# Patient Record
Sex: Female | Born: 1945
Health system: Southern US, Community
[De-identification: ages and names within clinical notes are randomized; demographics above are authoritative.]

## PROBLEM LIST (undated history)

## (undated) DIAGNOSIS — J45909 Unspecified asthma, uncomplicated: Secondary | ICD-10-CM

## (undated) DIAGNOSIS — T7840XA Allergy, unspecified, initial encounter: Secondary | ICD-10-CM

## (undated) DIAGNOSIS — E2839 Other primary ovarian failure: Secondary | ICD-10-CM

## (undated) DIAGNOSIS — M1990 Unspecified osteoarthritis, unspecified site: Secondary | ICD-10-CM

## (undated) DIAGNOSIS — K219 Gastro-esophageal reflux disease without esophagitis: Secondary | ICD-10-CM

## (undated) DIAGNOSIS — E669 Obesity, unspecified: Secondary | ICD-10-CM

## (undated) DIAGNOSIS — Z9889 Other specified postprocedural states: Secondary | ICD-10-CM

## (undated) DIAGNOSIS — H538 Other visual disturbances: Secondary | ICD-10-CM

## (undated) DIAGNOSIS — B370 Candidal stomatitis: Secondary | ICD-10-CM

## (undated) DIAGNOSIS — E8881 Metabolic syndrome: Secondary | ICD-10-CM

## (undated) DIAGNOSIS — R739 Hyperglycemia, unspecified: Secondary | ICD-10-CM

## (undated) DIAGNOSIS — M5020 Other cervical disc displacement, unspecified cervical region: Secondary | ICD-10-CM

## (undated) DIAGNOSIS — E559 Vitamin D deficiency, unspecified: Secondary | ICD-10-CM

## (undated) DIAGNOSIS — F329 Major depressive disorder, single episode, unspecified: Secondary | ICD-10-CM

## (undated) DIAGNOSIS — F32A Depression, unspecified: Secondary | ICD-10-CM

## (undated) HISTORY — DX: Unspecified osteoarthritis, unspecified site: M19.90

## (undated) HISTORY — DX: Depression, unspecified: F32.A

## (undated) HISTORY — DX: Other primary ovarian failure: E28.39

## (undated) HISTORY — DX: Other specified postprocedural states: Z98.890

## (undated) HISTORY — PX: CARPAL TUNNEL RELEASE: SHX101

## (undated) HISTORY — DX: Candidal stomatitis: B37.0

## (undated) HISTORY — DX: Metabolic syndrome: E88.810

## (undated) HISTORY — DX: Obesity, unspecified: E66.9

## (undated) HISTORY — DX: Major depressive disorder, single episode, unspecified: F32.9

## (undated) HISTORY — DX: Metabolic syndrome: E88.81

## (undated) HISTORY — PX: OTHER SURGICAL HISTORY: SHX169

## (undated) HISTORY — DX: Hyperglycemia, unspecified: R73.9

## (undated) HISTORY — DX: Other cervical disc displacement, unspecified cervical region: M50.20

## (undated) HISTORY — DX: Allergy, unspecified, initial encounter: T78.40XA

## (undated) HISTORY — DX: Vitamin D deficiency, unspecified: E55.9

## (undated) HISTORY — DX: Unspecified asthma, uncomplicated: J45.909

## (undated) HISTORY — DX: Other visual disturbances: H53.8

## (undated) HISTORY — PX: BILATERAL SALPINGOOPHORECTOMY: SHX1223

## (undated) HISTORY — DX: Gastro-esophageal reflux disease without esophagitis: K21.9

---

## 1988-11-16 HISTORY — PX: DILATION AND CURETTAGE OF UTERUS: SHX78

## 1989-11-16 HISTORY — PX: ABDOMINAL HYSTERECTOMY: SHX81

## 1990-11-16 HISTORY — PX: HERNIA REPAIR: SHX51

## 2005-04-20 ENCOUNTER — Ambulatory Visit: Payer: Self-pay | Admitting: Family Medicine

## 2006-09-03 ENCOUNTER — Ambulatory Visit: Payer: Self-pay | Admitting: Family Medicine

## 2006-11-05 ENCOUNTER — Ambulatory Visit: Payer: Self-pay | Admitting: Family Medicine

## 2008-02-08 ENCOUNTER — Ambulatory Visit: Payer: Self-pay | Admitting: Family Medicine

## 2009-11-13 ENCOUNTER — Ambulatory Visit: Payer: Self-pay | Admitting: Family Medicine

## 2010-06-16 LAB — HM DEXA SCAN: HM Dexa Scan: NORMAL

## 2010-06-20 LAB — HM PAP SMEAR: HM PAP: NORMAL

## 2010-11-14 ENCOUNTER — Ambulatory Visit: Payer: Self-pay | Admitting: Family Medicine

## 2012-02-24 ENCOUNTER — Ambulatory Visit: Payer: Self-pay | Admitting: Family Medicine

## 2012-02-24 LAB — HM MAMMOGRAPHY: HM MAMMO: NORMAL

## 2013-12-25 LAB — LIPID PANEL
Cholesterol: 191 mg/dL (ref 0–200)
HDL: 49 mg/dL (ref 35–70)
LDL Cholesterol: 122 mg/dL
Triglycerides: 100 mg/dL (ref 40–160)

## 2014-11-19 DIAGNOSIS — H269 Unspecified cataract: Secondary | ICD-10-CM | POA: Diagnosis not present

## 2015-01-24 LAB — HEMOGLOBIN A1C: HEMOGLOBIN A1C: 5.4 % (ref 4.0–6.0)

## 2015-07-27 ENCOUNTER — Encounter: Payer: Self-pay | Admitting: Family Medicine

## 2015-07-27 DIAGNOSIS — M1990 Unspecified osteoarthritis, unspecified site: Secondary | ICD-10-CM | POA: Insufficient documentation

## 2015-07-27 DIAGNOSIS — G5600 Carpal tunnel syndrome, unspecified upper limb: Secondary | ICD-10-CM | POA: Insufficient documentation

## 2015-07-27 DIAGNOSIS — E8881 Metabolic syndrome: Secondary | ICD-10-CM | POA: Insufficient documentation

## 2015-07-27 DIAGNOSIS — F330 Major depressive disorder, recurrent, mild: Secondary | ICD-10-CM | POA: Insufficient documentation

## 2015-07-27 DIAGNOSIS — M5020 Other cervical disc displacement, unspecified cervical region: Secondary | ICD-10-CM | POA: Insufficient documentation

## 2015-07-27 DIAGNOSIS — J4540 Moderate persistent asthma, uncomplicated: Secondary | ICD-10-CM | POA: Insufficient documentation

## 2015-07-27 DIAGNOSIS — S43429A Sprain of unspecified rotator cuff capsule, initial encounter: Secondary | ICD-10-CM | POA: Insufficient documentation

## 2015-07-27 DIAGNOSIS — R3915 Urgency of urination: Secondary | ICD-10-CM | POA: Insufficient documentation

## 2015-07-27 DIAGNOSIS — E559 Vitamin D deficiency, unspecified: Secondary | ICD-10-CM | POA: Insufficient documentation

## 2015-07-27 DIAGNOSIS — J309 Allergic rhinitis, unspecified: Secondary | ICD-10-CM | POA: Insufficient documentation

## 2015-07-27 DIAGNOSIS — K219 Gastro-esophageal reflux disease without esophagitis: Secondary | ICD-10-CM | POA: Insufficient documentation

## 2015-07-27 DIAGNOSIS — R739 Hyperglycemia, unspecified: Secondary | ICD-10-CM | POA: Insufficient documentation

## 2015-07-29 ENCOUNTER — Ambulatory Visit (INDEPENDENT_AMBULATORY_CARE_PROVIDER_SITE_OTHER): Payer: Medicare PPO | Admitting: Family Medicine

## 2015-07-29 ENCOUNTER — Encounter: Payer: Self-pay | Admitting: Family Medicine

## 2015-07-29 VITALS — BP 120/86 | HR 100 | Temp 98.6°F | Resp 16 | Ht 62.0 in | Wt 282.4 lb

## 2015-07-29 DIAGNOSIS — E538 Deficiency of other specified B group vitamins: Secondary | ICD-10-CM

## 2015-07-29 DIAGNOSIS — Z1239 Encounter for other screening for malignant neoplasm of breast: Secondary | ICD-10-CM

## 2015-07-29 DIAGNOSIS — K219 Gastro-esophageal reflux disease without esophagitis: Secondary | ICD-10-CM

## 2015-07-29 DIAGNOSIS — J4540 Moderate persistent asthma, uncomplicated: Secondary | ICD-10-CM

## 2015-07-29 DIAGNOSIS — F32A Depression, unspecified: Secondary | ICD-10-CM

## 2015-07-29 DIAGNOSIS — E8881 Metabolic syndrome: Secondary | ICD-10-CM

## 2015-07-29 DIAGNOSIS — R739 Hyperglycemia, unspecified: Secondary | ICD-10-CM | POA: Diagnosis not present

## 2015-07-29 DIAGNOSIS — Z23 Encounter for immunization: Secondary | ICD-10-CM

## 2015-07-29 DIAGNOSIS — Z1211 Encounter for screening for malignant neoplasm of colon: Secondary | ICD-10-CM

## 2015-07-29 DIAGNOSIS — F329 Major depressive disorder, single episode, unspecified: Secondary | ICD-10-CM

## 2015-07-29 DIAGNOSIS — D692 Other nonthrombocytopenic purpura: Secondary | ICD-10-CM | POA: Diagnosis not present

## 2015-07-29 DIAGNOSIS — H8112 Benign paroxysmal vertigo, left ear: Secondary | ICD-10-CM | POA: Diagnosis not present

## 2015-07-29 MED ORDER — BREO ELLIPTA 100-25 MCG/INH IN AEPB: 1.0000 | INHALATION_SPRAY | Freq: Every day | RESPIRATORY_TRACT | Status: DC

## 2015-07-29 MED ORDER — ASPIRIN EC 81 MG PO TBEC: 81.0000 mg | DELAYED_RELEASE_TABLET | Freq: Every day | ORAL | Status: DC

## 2015-07-29 MED ORDER — OMEPRAZOLE 40 MG PO CPDR: 40.0000 mg | DELAYED_RELEASE_CAPSULE | Freq: Every day | ORAL | Status: DC

## 2015-07-29 NOTE — Progress Notes (Signed)
Name: Christie Hanson   MRN: [id number]    DOB: 11/01/1946   Date:07/29/2015       Progress Note  Subjective  Chief Complaint  Chief Complaint  Patient presents with  . Medication Refill    6 month follow-up  . Gastrophageal Reflux  . Asthma  . Hypoglycemia    pt states been checking at home due to blurred vision and numbness. pt states stays aroung 115 fasting    HPI  Hypoglycemia/Metabolic Syndrome: glucose at home has been in great control average 115, lowest was 102 highest of 122. She continues to have blurred vision but also has mild cataracts and needs to follow up with Ophtalmologist  Asthma Moderate: she states occasionally wakes up night with some wheezing , and has a cough and symptoms goes away. About twice monthly. Denies SOB with activity, using Breo most days, and no need to use rescue inhaler in the past 6 months, last flare was about 7 months ago.   GERD: taking Omeprazole daily, no heartburn, occasionally has regurgitation. Avoids eating before going to bed  Depression: she stopped taking all medication, doing well, sewing more , likes having longer hours in a day. Occasionally has crying spells but doing well, and does not want to go back on medication   Vertigo: she has noticed that she gest dizzy when she has head movement to the left side. She has a history of BPPV but this episode has been going on for the past 6 weeks.    Patient Active Problem List   Diagnosis Date Noted  . Vitamin B12 deficiency 07/29/2015  . Carpal tunnel syndrome 07/27/2015  . Osteoarthritis 07/27/2015  . Clinical depression 07/27/2015  . Gastro-esophageal reflux disease without esophagitis 07/27/2015  . Displacement of cervical intervertebral disc without myelopathy 07/27/2015  . Blood glucose elevated 07/27/2015  . Dysmetabolic syndrome 07/27/2015  . Vitamin D deficiency 07/27/2015  . Sprain of rotator cuff capsule 07/27/2015  . Urgency of urination 07/27/2015  . Allergic  rhinitis 07/27/2015  . Asthma, moderate persistent 07/27/2015    Past Surgical History  Procedure Laterality Date  . Carpal tunnel release Bilateral [id number]    [facility]  . Abdominal hysterectomy    . Bilateral salpingoophorectomy    . Dandc    . Hernia repair      Family History  Problem Relation Age of Onset  . Hypertension Mother   . Cancer Father     prostate  . CVA Father   . Depression Father   . Depression Brother   . Cancer Brother     prostate  . Depression Brother     Social History   Social History  . Marital Status: Married    Spouse Name: N/A  . Number of Children: N/A  . Years of Education: N/A   Occupational History  . Not on file.   Social History Main Topics  . Smoking status: Never Smoker   . Smokeless tobacco: Never Used  . Alcohol Use: No  . Drug Use: No  . Sexual Activity: Yes   Other Topics Concern  . Not on file   Social History Narrative     Current outpatient prescriptions:  .  acetaminophen (TYLENOL) 500 MG tablet, Take 1 tablet by mouth as needed., Disp: , Rfl:  .  BREO ELLIPTA 100-25 MCG/INH AEPB, Inhale 1 puff into the lungs daily., Disp: 1 each, Rfl: 5 .  Cholecalciferol (VITAMIN D) 2000 UNITS tablet, Take 1 tablet by mouth daily., Disp: ,  Rfl:  .  cyanocobalamin 100 MCG tablet, Take 1 tablet by mouth daily., Disp: , Rfl:  .  omeprazole (PRILOSEC) 40 MG capsule, Take 1 capsule (40 mg total) by mouth daily., Disp: 30 capsule, Rfl: 5 .  aspirin EC 81 MG tablet, Take 1 tablet (81 mg total) by mouth daily., Disp: 30 tablet, Rfl: 0  Allergies  Allergen Reactions  . Morphine   . Penicillins   . Sulfa Antibiotics      ROS  Constitutional: Negative for fever or weight change.  Respiratory: Negative for cough and shortness of breath.   Cardiovascular: Negative for chest pain or palpitations.  Gastrointestinal: Negative for abdominal pain, no bowel changes.  Musculoskeletal: Negative for gait problem or joint swelling.  Skin:  Negative for rash.  Neurological: Positive  for dizziness , no  headache.  No other specific complaints in a complete review of systems (except as listed in HPI above).  Objective  Filed Vitals:   07/29/15 1335  BP: 120/86  Pulse: 100  Temp: 98.6 F (37 C)  TempSrc: Oral  Resp: 16  Height:  (1.575 m)  Weight: 282 lb 6.4 oz (128.096 kg)  SpO2: 96%    Body mass index is 51.64 kg/(m^2).  Physical Exam  Constitutional: Patient appears well-developed and well-nourished. Obese  No distress.  HEENT: head atraumatic, normocephalic, pupils equal and reactive to light, neck supple, throat within normal limits Cardiovascular: Normal rate, regular rhythm and normal heart sounds.  No murmur heard. No BLE edema. Pulmonary/Chest: Effort normal and breath sounds normal. No respiratory distress. Abdominal: Soft.  There is no tenderness. Psychiatric: Patient has a normal mood and affect. behavior is normal. Judgment and thought content normal.  PHQ2/9: Depression screen PHQ 2/9 07/29/2015  Decreased Interest 0  Down, Depressed, Hopeless 1  PHQ - 2 Score 1    Fall Risk: Fall Risk  07/29/2015  Falls in the past year? No     Functional Status Survey: Is the patient deaf or have difficulty hearing?: No Does the patient have difficulty seeing, even when wearing glasses/contacts?: Yes (glasses) Does the patient have difficulty concentrating, remembering, or making decisions?: No Does the patient have difficulty walking or climbing stairs?: No Does the patient have difficulty dressing or bathing?: No Does the patient have difficulty doing errands alone such as visiting a doctor's office or shopping?: No    Assessment & Plan  1. BPPV (benign paroxysmal positional vertigo), left -ENT  2. Needs flu shot  - Flu vaccine HIGH DOSE PF (Fluzone High dose)  3. Breast cancer screening  - MM Digital Screening; Future  4. Asthma, moderate persistent, uncomplicated  - BREO ELLIPTA  100-25 MCG/INH AEPB; Inhale 1 puff into the lungs daily.  Dispense: 1 each; Refill: 5  5. Dysmetabolic syndrome Doing well, glucose at goal, go to eye doctor to evaluate blurred vision  6. Blood glucose elevated Recheck before next visit  7. Clinical depression Weaned self off medication and is stable  8. Gastro-esophageal reflux disease without esophagitis  - omeprazole (PRILOSEC) 40 MG capsule; Take 1 capsule (40 mg total) by mouth daily.  Dispense: 30 capsule; Refill: 5  9. Colon cancer screening Refused to have Cologuard or colonoscopy   10. Vitamin B12 deficiency Recheck labs before next visit   11. Senile purpura

## 2015-08-29 DIAGNOSIS — H353131 Nonexudative age-related macular degeneration, bilateral, early dry stage: Secondary | ICD-10-CM | POA: Diagnosis not present

## 2015-08-29 DIAGNOSIS — H2513 Age-related nuclear cataract, bilateral: Secondary | ICD-10-CM | POA: Diagnosis not present

## 2015-09-03 DIAGNOSIS — H2513 Age-related nuclear cataract, bilateral: Secondary | ICD-10-CM | POA: Diagnosis not present

## 2015-09-09 ENCOUNTER — Encounter: Payer: Self-pay | Admitting: Family Medicine

## 2015-09-11 ENCOUNTER — Ambulatory Visit: Admit: 2015-09-11 | Payer: Self-pay | Admitting: Ophthalmology

## 2015-09-11 SURGERY — PHACOEMULSIFICATION, CATARACT, WITH IOL INSERTION
Anesthesia: Topical | Laterality: Left

## 2015-09-23 ENCOUNTER — Ambulatory Visit: Payer: Medicare PPO | Admitting: Anesthesiology

## 2015-09-23 ENCOUNTER — Encounter
Admission: RE | Disposition: A | Discharge status: Home / Self Care | Payer: Self-pay | Source: Ambulatory Visit | Attending: Ophthalmology

## 2015-09-23 ENCOUNTER — Encounter: Payer: Self-pay | Admitting: *Deleted

## 2015-09-23 ENCOUNTER — Ambulatory Visit
Admission: RE | Admit: 2015-09-23 | Discharge: 2015-09-23 | Disposition: A | Discharge status: Home / Self Care | Payer: Medicare PPO | Source: Ambulatory Visit | Attending: Ophthalmology | Admitting: Ophthalmology

## 2015-09-23 DIAGNOSIS — Z880 Allergy status to penicillin: Secondary | ICD-10-CM | POA: Diagnosis not present

## 2015-09-23 DIAGNOSIS — G709 Myoneural disorder, unspecified: Secondary | ICD-10-CM | POA: Diagnosis not present

## 2015-09-23 DIAGNOSIS — Z6841 Body Mass Index (BMI) 40.0 and over, adult: Secondary | ICD-10-CM | POA: Insufficient documentation

## 2015-09-23 DIAGNOSIS — F329 Major depressive disorder, single episode, unspecified: Secondary | ICD-10-CM | POA: Diagnosis not present

## 2015-09-23 DIAGNOSIS — Z885 Allergy status to narcotic agent status: Secondary | ICD-10-CM | POA: Diagnosis not present

## 2015-09-23 DIAGNOSIS — J45909 Unspecified asthma, uncomplicated: Secondary | ICD-10-CM | POA: Diagnosis not present

## 2015-09-23 DIAGNOSIS — M1990 Unspecified osteoarthritis, unspecified site: Secondary | ICD-10-CM | POA: Insufficient documentation

## 2015-09-23 DIAGNOSIS — K219 Gastro-esophageal reflux disease without esophagitis: Secondary | ICD-10-CM | POA: Insufficient documentation

## 2015-09-23 DIAGNOSIS — H2512 Age-related nuclear cataract, left eye: Secondary | ICD-10-CM | POA: Diagnosis not present

## 2015-09-23 DIAGNOSIS — H2513 Age-related nuclear cataract, bilateral: Secondary | ICD-10-CM | POA: Diagnosis not present

## 2015-09-23 HISTORY — PX: CATARACT EXTRACTION W/PHACO: SHX586

## 2015-09-23 SURGERY — PHACOEMULSIFICATION, CATARACT, WITH IOL INSERTION
Anesthesia: Monitor Anesthesia Care | Laterality: Left | Wound class: Clean

## 2015-09-23 MED ORDER — CEFUROXIME OPHTHALMIC INJECTION 1 MG/0.1 ML
INJECTION | OPHTHALMIC | Status: DC | PRN
  Administered 2015-09-23: 0.1 mL via INTRACAMERAL

## 2015-09-23 MED ORDER — HYALURONIDASE HUMAN 150 UNIT/ML IJ SOLN
INTRAMUSCULAR | Status: AC
  Filled 2015-09-23: qty 1

## 2015-09-23 MED ORDER — MOXIFLOXACIN HCL 0.5 % OP SOLN: 1.0000 [drp] | Freq: Once | OPHTHALMIC | Status: DC

## 2015-09-23 MED ORDER — ARMC OPHTHALMIC DILATING GEL
1.0000 | OPHTHALMIC | Status: DC | PRN
Start: 2015-09-23 — End: 2015-09-23
  Administered 2015-09-23: 1 via OPHTHALMIC

## 2015-09-23 MED ORDER — MIDAZOLAM HCL 2 MG/2ML IJ SOLN
INTRAMUSCULAR | Status: DC | PRN
  Administered 2015-09-23: 1 mg via INTRAVENOUS

## 2015-09-23 MED ORDER — ARMC OPHTHALMIC DILATING GEL
OPHTHALMIC | Status: AC
  Filled 2015-09-23: qty 0.25

## 2015-09-23 MED ORDER — LIDOCAINE HCL (PF) 4 % IJ SOLN
INTRAMUSCULAR | Status: AC
  Filled 2015-09-23: qty 5

## 2015-09-23 MED ORDER — POVIDONE-IODINE 5 % OP SOLN
OPHTHALMIC | Status: AC
  Filled 2015-09-23: qty 30

## 2015-09-23 MED ORDER — CARBACHOL 0.01 % IO SOLN
INTRAOCULAR | Status: DC | PRN
  Administered 2015-09-23: 0.5 mL via INTRAOCULAR

## 2015-09-23 MED ORDER — MOXIFLOXACIN HCL 0.5 % OP SOLN
OPHTHALMIC | Status: AC
  Filled 2015-09-23: qty 3

## 2015-09-23 MED ORDER — NEOMYCIN-POLYMYXIN-DEXAMETH 3.5-10000-0.1 OP OINT
TOPICAL_OINTMENT | OPHTHALMIC | Status: AC
  Filled 2015-09-23: qty 3.5

## 2015-09-23 MED ORDER — NEOMYCIN-POLYMYXIN-DEXAMETH 0.1 % OP OINT
TOPICAL_OINTMENT | OPHTHALMIC | Status: DC | PRN
  Administered 2015-09-23: 1 via OPHTHALMIC

## 2015-09-23 MED ORDER — EPINEPHRINE HCL 1 MG/ML IJ SOLN
INTRAMUSCULAR | Status: AC
  Filled 2015-09-23: qty 1

## 2015-09-23 MED ORDER — NA HYALUR & NA CHOND-NA HYALUR 0.4-0.35 ML IO KIT
PACK | INTRAOCULAR | Status: DC | PRN
  Administered 2015-09-23: 1 mL via INTRAOCULAR

## 2015-09-23 MED ORDER — SODIUM CHLORIDE 0.9 % IV SOLN
INTRAVENOUS | Status: DC
Start: 2015-09-23 — End: 2015-09-23
  Administered 2015-09-23: 06:00:00 via INTRAVENOUS

## 2015-09-23 MED ORDER — POVIDONE-IODINE 5 % OP SOLN
1.0000 "application " | OPHTHALMIC | Status: AC | PRN
  Administered 2015-09-23: 1 via OPHTHALMIC

## 2015-09-23 MED ORDER — TETRACAINE HCL 0.5 % OP SOLN
1.0000 [drp] | OPHTHALMIC | Status: AC | PRN
  Administered 2015-09-23: 1 [drp] via OPHTHALMIC

## 2015-09-23 MED ORDER — EPINEPHRINE HCL 1 MG/ML IJ SOLN
INTRAOCULAR | Status: DC | PRN
  Administered 2015-09-23: 1 mL via OPHTHALMIC

## 2015-09-23 MED ORDER — FENTANYL CITRATE (PF) 100 MCG/2ML IJ SOLN
INTRAMUSCULAR | Status: DC | PRN
  Administered 2015-09-23: 50 ug via INTRAVENOUS

## 2015-09-23 MED ORDER — NA HYALUR & NA CHOND-NA HYALUR 0.55-0.5 ML IO KIT
PACK | INTRAOCULAR | Status: AC
  Filled 2015-09-23: qty 1.05

## 2015-09-23 MED ORDER — BUPIVACAINE HCL (PF) 0.75 % IJ SOLN
INTRAMUSCULAR | Status: AC
  Filled 2015-09-23: qty 10

## 2015-09-23 MED ORDER — TETRACAINE HCL 0.5 % OP SOLN
OPHTHALMIC | Status: AC
  Filled 2015-09-23: qty 2

## 2015-09-23 MED ORDER — CEFUROXIME OPHTHALMIC INJECTION 1 MG/0.1 ML
INJECTION | OPHTHALMIC | Status: AC
  Filled 2015-09-23: qty 0.1

## 2015-09-23 SURGICAL SUPPLY — 22 items
CANNULA ANT/CHMB 27GA (MISCELLANEOUS) ×3 IMPLANT
CUP MEDICINE 2OZ PLAST GRAD ST (MISCELLANEOUS) ×3 IMPLANT
GLOVE BIO SURGEON STRL SZ8 (GLOVE) ×3 IMPLANT
GLOVE BIOGEL M 6.5 STRL (GLOVE) ×3 IMPLANT
GLOVE SURG LX 7.5 STRW (GLOVE) ×2
GLOVE SURG LX STRL 7.5 STRW (GLOVE) ×1 IMPLANT
GOWN STRL REUS W/ TWL LRG LVL3 (GOWN DISPOSABLE) ×2 IMPLANT
GOWN STRL REUS W/TWL LRG LVL3 (GOWN DISPOSABLE) ×4
LENS IOL TECNIS 15.0 (Intraocular Lens) ×3 IMPLANT
LENS IOL TECNIS MONO 1P 15.0 (Intraocular Lens) ×1 IMPLANT
PACK CATARACT (MISCELLANEOUS) ×3 IMPLANT
PACK CATARACT BRASINGTON LX (MISCELLANEOUS) ×3 IMPLANT
PACK EYE AFTER SURG (MISCELLANEOUS) ×3 IMPLANT
SOL BSS BAG (MISCELLANEOUS) ×3
SOL PREP PVP 2OZ (MISCELLANEOUS) ×3
SOLUTION BSS BAG (MISCELLANEOUS) ×1 IMPLANT
SOLUTION PREP PVP 2OZ (MISCELLANEOUS) ×1 IMPLANT
SYR 3ML LL SCALE MARK (SYRINGE) ×3 IMPLANT
SYR 5ML LL (SYRINGE) ×3 IMPLANT
SYR TB 1ML 27GX1/2 LL (SYRINGE) ×3 IMPLANT
WATER STERILE IRR 1000ML POUR (IV SOLUTION) ×3 IMPLANT
WIPE NON LINTING 3.25X3.25 (MISCELLANEOUS) ×3 IMPLANT

## 2015-09-23 NOTE — Op Note (Signed)
OPERATIVE NOTE  Christie HerbKatherine J Hanson [id number] 09/23/2015   PREOPERATIVE DIAGNOSIS:  Nuclear sclerotic cataract left eye. H25.12   POSTOPERATIVE DIAGNOSIS:    Nuclear sclerotic cataract left eye.     PROCEDURE:  Phacoemusification with posterior chamber intraocular lens placement of the left eye   LENS:   Implant Name Type Inv. Item Serial No. Manufacturer Lot No. LRB No. Used  LENS IMPL INTRAOC ZCB00 15.0 - [id number] Intraocular Lens LENS IMPL INTRAOC ZCB00 15.0 [id number]   Left 1        ULTRASOUND TIME: 12 % of 0 minutes, 57 seconds.  CDE 7.0   SURGEON:  Deirdre Evenerhadwick R. Nahomi Hegner, MD   ANESTHESIA:  Topical with tetracaine drops and 2% Xylocaine jelly.   COMPLICATIONS:  None.   DESCRIPTION OF PROCEDURE:  The patient was identified in the holding room and transported to the operating room and placed in the supine position under the operating microscope.  The left eye was identified as the operative eye and it was prepped and draped in the usual sterile ophthalmic fashion.   A 1 millimeter clear-corneal paracentesis was made at the 1:30 position.  The anterior chamber was filled with Viscoat viscoelastic.  A 2.4 millimeter keratome was used to make a near-clear corneal incision at the 10:30 position.  .  A curvilinear capsulorrhexis was made with a cystotome and capsulorrhexis forceps.  Balanced salt solution was used to hydrodissect and hydrodelineate the nucleus.   Phacoemulsification was then used in stop and chop fashion to remove the lens nucleus and epinucleus.  The remaining cortex was then removed using the irrigation and aspiration handpiece. Provisc was then placed into the capsular bag to distend it for lens placement.  A lens was then injected into the capsular bag.  The remaining viscoelastic was aspirated.   Wounds were hydrated with balanced salt solution.  The anterior chamber was inflated to a physiologic pressure with balanced salt solution. Cefuroxime 0.1 ml of a  10mg /ml solution was injected into the anterior chamber for a dose of 1 mg of intracameral antibiotic at the completion of the case.  Miostat was placed into the anterior chamber to constrict the pupil.  No wound leaks were noted.  Topical Vigamox drops and Maxitrol ointment were applied to the eye.  The patient was taken to the recovery room in stable condition without complications of anesthesia or surgery  Heber Hoog 09/23/2015, 7:58 AM

## 2015-09-23 NOTE — Anesthesia Procedure Notes (Signed)
Date/Time: 09/23/2015 7:30 AM Performed by: Henrietta HooverPOPE, Annalina Needles Pre-anesthesia Checklist: Patient identified, Emergency Drugs available, Suction available, Patient being monitored and Timeout performed Oxygen Delivery Method: Nasal cannula Preoxygenation: Pre-oxygenation with 100% oxygen Intubation Type: IV induction

## 2015-09-23 NOTE — Discharge Instructions (Signed)
AMBULATORY SURGERY  DISCHARGE INSTRUCTIONS   1) The drugs that you were given will stay in your system until tomorrow so for the next 24 hours you should not:  A) Drive an automobile B) Make any legal decisions C) Drink any alcoholic beverage   2) You may resume regular meals tomorrow.  Today it is better to start with liquids and gradually work up to solid foods.  You may eat anything you prefer, but it is better to start with liquids, then soup and crackers, and gradually work up to solid foods.   3) Please notify your doctor immediately if you have any unusual bleeding, trouble breathing, redness and pain at the surgery site, drainage, fever, or pain not relieved by medication.    4) Additional Instructions:   Eye Surgery Discharge Instructions  Expect mild scratchy sensation or mild soreness. DO NOT RUB YOUR EYE!  The day of surgery:  Minimal physical activity, but bed rest is not required  No reading, computer work, or close hand work  No bending, lifting, or straining.  May watch TV  For 24 hours:  No driving, legal decisions, or alcoholic beverages  Safety precautions  Eat anything you prefer: It is better to start with liquids, then soup then solid foods.  _____ Eye patch should be worn until postoperative exam tomorrow.  ____ Solar shield eyeglasses should be worn for comfort in the sunlight/patch while sleeping  Resume all regular medications including aspirin or Coumadin if these were discontinued prior to surgery. You may shower, bathe, shave, or wash your hair. Tylenol may be taken for mild discomfort.  Call your doctor if you experience significant pain, nausea, or vomiting, fever > 101 or other signs of infection. [phone] or [phone] Specific instructions:  Follow-up Information    Follow up with DINGELDEIN,STEVEN, MD In 1 day.   Specialty:  Ophthalmology   Why:  November 8 at 915am   Contact information:   [address]   [place] [address] [phone]          Please contact your physician with any problems or Same Day Surgery at [phone], Monday through Friday 6 am to 4 pm, or [place] [facility] Main number at [phone].

## 2015-09-23 NOTE — H&P (Signed)
  The History and Physical notes are on paper, have been signed, and are to be scanned. The patient remains stable and unchanged from the H&P.   Previous H&P reviewed, patient examined, and there are no changes.  Christie Hanson 09/23/2015 7:32 AM

## 2015-09-23 NOTE — Anesthesia Postprocedure Evaluation (Signed)
  Anesthesia Post-op Note  Patient: Christie HerbKatherine J Schermerhorn  Procedure(s) Performed: Procedure(s) with comments: CATARACT EXTRACTION PHACO AND INTRAOCULAR LENS PLACEMENT (IOC) (Left) - US 00:57 AP% 12.2 6.98 CDE Fluid pack lot # [id number]  Anesthesia type:MAC  Patient location: PACU  Post pain: Pain level controlled  Post assessment: Post-op Vital signs reviewed, Patient's Cardiovascular Status Stable, Respiratory Function Stable, Patent Airway and No signs of Nausea or vomiting  Post vital signs: Reviewed and stable  Last Vitals:  Filed Vitals:   09/23/15 0830  BP: 169/86  Temp:   Resp: 16    Level of consciousness: awake, alert  and patient cooperative  Complications: No apparent anesthesia complications

## 2015-09-23 NOTE — Transfer of Care (Signed)
Immediate Anesthesia Transfer of Care Note  Patient: Christie HerbKatherine J Weir  Procedure(s) Performed: Procedure(s) with comments: CATARACT EXTRACTION PHACO AND INTRAOCULAR LENS PLACEMENT (IOC) (Left) - US 00:57 AP% 12.2 6.98 CDE Fluid pack lot # [id number]  Patient Location: PACU  Anesthesia Type:MAC  Level of Consciousness: sedated  Airway & Oxygen Therapy: Patient Spontanous Breathing and Patient connected to nasal cannula oxygen  Post-op Assessment: Report given to RN and Post -op Vital signs reviewed and stable  Post vital signs: Reviewed and stable  Last Vitals:  Filed Vitals:   09/23/15 0610  BP: 163/89  Temp: 36.8 C  Resp: 16    Complications: No apparent anesthesia complications

## 2015-09-23 NOTE — Anesthesia Preprocedure Evaluation (Signed)
Anesthesia Evaluation  Patient identified by MRN, date of birth, ID band Patient awake    Reviewed: Allergy & Precautions, NPO status , Patient's Chart, lab work & pertinent test results, reviewed documented beta blocker date and time   Airway Mallampati: III  TM Distance: >3 FB     Dental  (+) Chipped   Pulmonary asthma ,           Cardiovascular      Neuro/Psych PSYCHIATRIC DISORDERS Depression  Neuromuscular disease    GI/Hepatic GERD  ,  Endo/Other  Morbid obesity  Renal/GU      Musculoskeletal  (+) Arthritis ,   Abdominal   Peds  Hematology   Anesthesia Other Findings   Reproductive/Obstetrics                             Anesthesia Physical Anesthesia Plan  ASA: III  Anesthesia Plan: MAC   Post-op Pain Management:    Induction:   Airway Management Planned:   Additional Equipment:   Intra-op Plan:   Post-operative Plan:   Informed Consent: I have reviewed the patients History and Physical, chart, labs and discussed the procedure including the risks, benefits and alternatives for the proposed anesthesia with the patient or authorized representative who has indicated his/her understanding and acceptance.     Plan Discussed with: CRNA  Anesthesia Plan Comments:         Anesthesia Quick Evaluation

## 2015-10-03 DIAGNOSIS — H2511 Age-related nuclear cataract, right eye: Secondary | ICD-10-CM | POA: Diagnosis not present

## 2015-10-08 ENCOUNTER — Encounter: Payer: Self-pay | Admitting: *Deleted

## 2015-10-17 ENCOUNTER — Encounter: Payer: Self-pay | Admitting: *Deleted

## 2015-10-17 ENCOUNTER — Ambulatory Visit: Payer: Medicare PPO | Admitting: Certified Registered Nurse Anesthetist

## 2015-10-17 ENCOUNTER — Ambulatory Visit
Admission: RE | Admit: 2015-10-17 | Discharge: 2015-10-17 | Disposition: A | Discharge status: Home / Self Care | Payer: Medicare PPO | Source: Ambulatory Visit | Attending: Ophthalmology | Admitting: Ophthalmology

## 2015-10-17 ENCOUNTER — Encounter
Admission: RE | Disposition: A | Discharge status: Home / Self Care | Payer: Self-pay | Source: Ambulatory Visit | Attending: Ophthalmology

## 2015-10-17 DIAGNOSIS — Z9842 Cataract extraction status, left eye: Secondary | ICD-10-CM | POA: Insufficient documentation

## 2015-10-17 DIAGNOSIS — Z7951 Long term (current) use of inhaled steroids: Secondary | ICD-10-CM | POA: Diagnosis not present

## 2015-10-17 DIAGNOSIS — H269 Unspecified cataract: Secondary | ICD-10-CM | POA: Diagnosis present

## 2015-10-17 DIAGNOSIS — Z880 Allergy status to penicillin: Secondary | ICD-10-CM | POA: Diagnosis not present

## 2015-10-17 DIAGNOSIS — Z885 Allergy status to narcotic agent status: Secondary | ICD-10-CM | POA: Diagnosis not present

## 2015-10-17 DIAGNOSIS — F329 Major depressive disorder, single episode, unspecified: Secondary | ICD-10-CM | POA: Diagnosis not present

## 2015-10-17 DIAGNOSIS — H2511 Age-related nuclear cataract, right eye: Secondary | ICD-10-CM | POA: Diagnosis not present

## 2015-10-17 DIAGNOSIS — K219 Gastro-esophageal reflux disease without esophagitis: Secondary | ICD-10-CM | POA: Insufficient documentation

## 2015-10-17 DIAGNOSIS — M1991 Primary osteoarthritis, unspecified site: Secondary | ICD-10-CM | POA: Diagnosis not present

## 2015-10-17 DIAGNOSIS — J45909 Unspecified asthma, uncomplicated: Secondary | ICD-10-CM | POA: Diagnosis not present

## 2015-10-17 DIAGNOSIS — Z79899 Other long term (current) drug therapy: Secondary | ICD-10-CM | POA: Insufficient documentation

## 2015-10-17 DIAGNOSIS — Z882 Allergy status to sulfonamides status: Secondary | ICD-10-CM | POA: Diagnosis not present

## 2015-10-17 DIAGNOSIS — Z9889 Other specified postprocedural states: Secondary | ICD-10-CM | POA: Insufficient documentation

## 2015-10-17 HISTORY — PX: CATARACT EXTRACTION W/PHACO: SHX586

## 2015-10-17 SURGERY — PHACOEMULSIFICATION, CATARACT, WITH IOL INSERTION
Anesthesia: Monitor Anesthesia Care | Laterality: Right

## 2015-10-17 MED ORDER — NEOMYCIN-POLYMYXIN-DEXAMETH 0.1 % OP OINT
TOPICAL_OINTMENT | OPHTHALMIC | Status: DC | PRN
  Administered 2015-10-17: 1 via OPHTHALMIC

## 2015-10-17 MED ORDER — POVIDONE-IODINE 5 % OP SOLN
OPHTHALMIC | Status: AC
Start: 2015-10-17 — End: 2015-10-17
  Administered 2015-10-17: 07:00:00
  Filled 2015-10-17: qty 30

## 2015-10-17 MED ORDER — MIDAZOLAM HCL 2 MG/2ML IJ SOLN
INTRAMUSCULAR | Status: DC | PRN
  Administered 2015-10-17 (×2): 1 mg via INTRAVENOUS

## 2015-10-17 MED ORDER — NA HYALUR & NA CHOND-NA HYALUR 0.55-0.5 ML IO KIT
PACK | INTRAOCULAR | Status: AC
  Filled 2015-10-17: qty 2.1

## 2015-10-17 MED ORDER — NA HYALUR & NA CHOND-NA HYALUR 0.4-0.35 ML IO KIT
PACK | INTRAOCULAR | Status: DC | PRN
  Administered 2015-10-17: 1 mL via INTRAOCULAR

## 2015-10-17 MED ORDER — TETRACAINE HCL 0.5 % OP SOLN
OPHTHALMIC | Status: AC
Start: 2015-10-17 — End: 2015-10-17
  Administered 2015-10-17: 07:00:00
  Filled 2015-10-17: qty 2

## 2015-10-17 MED ORDER — CARBACHOL 0.01 % IO SOLN
INTRAOCULAR | Status: DC | PRN
  Administered 2015-10-17: .5 mL via INTRAOCULAR

## 2015-10-17 MED ORDER — CEFUROXIME OPHTHALMIC INJECTION 1 MG/0.1 ML
INJECTION | OPHTHALMIC | Status: DC | PRN
  Administered 2015-10-17: 0.1 mL via INTRACAMERAL

## 2015-10-17 MED ORDER — CEFUROXIME OPHTHALMIC INJECTION 1 MG/0.1 ML
INJECTION | OPHTHALMIC | Status: AC
  Filled 2015-10-17: qty 0.1

## 2015-10-17 MED ORDER — NEOMYCIN-POLYMYXIN-DEXAMETH 3.5-10000-0.1 OP OINT
TOPICAL_OINTMENT | OPHTHALMIC | Status: AC
  Filled 2015-10-17: qty 3.5

## 2015-10-17 MED ORDER — NA HYALUR & NA CHOND-NA HYALUR 0.55-0.5 ML IO KIT
PACK | INTRAOCULAR | Status: AC
  Filled 2015-10-17: qty 1.05

## 2015-10-17 MED ORDER — EPINEPHRINE HCL 1 MG/ML IJ SOLN
INTRAOCULAR | Status: DC | PRN
  Administered 2015-10-17: 250 mL via OPHTHALMIC

## 2015-10-17 MED ORDER — ARMC OPHTHALMIC DILATING GEL
OPHTHALMIC | Status: AC
  Filled 2015-10-17: qty 0.25

## 2015-10-17 MED ORDER — MOXIFLOXACIN HCL 0.5 % OP SOLN
OPHTHALMIC | Status: AC
  Filled 2015-10-17: qty 3

## 2015-10-17 MED ORDER — LIDOCAINE HCL (PF) 4 % IJ SOLN
INTRAMUSCULAR | Status: AC
  Filled 2015-10-17: qty 5

## 2015-10-17 MED ORDER — EPINEPHRINE HCL 1 MG/ML IJ SOLN
INTRAMUSCULAR | Status: AC
  Filled 2015-10-17: qty 1

## 2015-10-17 MED ORDER — LIDOCAINE HCL (PF) 4 % IJ SOLN
INTRAOCULAR | Status: DC | PRN
  Administered 2015-10-17: .5 mL via OPHTHALMIC

## 2015-10-17 MED ORDER — SODIUM CHLORIDE 0.9 % IV SOLN
INTRAVENOUS | Status: DC | PRN
  Administered 2015-10-17: 08:00:00 via INTRAVENOUS

## 2015-10-17 SURGICAL SUPPLY — 24 items
CANNULA ANT/CHMB 27GA (MISCELLANEOUS) ×6 IMPLANT
CUP MEDICINE 2OZ PLAST GRAD ST (MISCELLANEOUS) ×6 IMPLANT
GLOVE BIO SURGEON STRL SZ8 (GLOVE) ×3 IMPLANT
GLOVE BIOGEL M 6.5 STRL (GLOVE) ×3 IMPLANT
GLOVE SURG LX 7.5 STRW (GLOVE) ×2
GLOVE SURG LX STRL 7.5 STRW (GLOVE) ×1 IMPLANT
GOWN STRL REUS W/ TWL LRG LVL3 (GOWN DISPOSABLE) ×2 IMPLANT
GOWN STRL REUS W/TWL LRG LVL3 (GOWN DISPOSABLE) ×4
LENS IOL TECNIS 16.0 (Intraocular Lens) ×3 IMPLANT
LENS IOL TECNIS MONO 1P 16.0 (Intraocular Lens) ×1 IMPLANT
PACK CATARACT (MISCELLANEOUS) ×3 IMPLANT
PACK CATARACT BRASINGTON LX (MISCELLANEOUS) ×3 IMPLANT
PACK EYE AFTER SURG (MISCELLANEOUS) ×3 IMPLANT
SOL BAL SALT 15ML (MISCELLANEOUS) ×3
SOL BSS BAG (MISCELLANEOUS) ×3
SOL PREP PVP 2OZ (MISCELLANEOUS) ×3
SOLUTION BAL SALT 15ML (MISCELLANEOUS) ×1 IMPLANT
SOLUTION BSS BAG (MISCELLANEOUS) ×1 IMPLANT
SOLUTION PREP PVP 2OZ (MISCELLANEOUS) ×1 IMPLANT
SYR 3ML LL SCALE MARK (SYRINGE) ×3 IMPLANT
SYR 5ML LL (SYRINGE) ×3 IMPLANT
SYR TB 1ML 27GX1/2 LL (SYRINGE) ×3 IMPLANT
WATER STERILE IRR 1000ML POUR (IV SOLUTION) ×3 IMPLANT
WIPE NON LINTING 3.25X3.25 (MISCELLANEOUS) ×3 IMPLANT

## 2015-10-17 NOTE — Anesthesia Procedure Notes (Signed)
Procedure Name: MAC Date/Time: 10/17/2015 8:08 AM Performed by: Ginger CarneMICHELET, Theola Cuellar Pre-anesthesia Checklist: Patient identified, Emergency Drugs available, Suction available and Patient being monitored Patient Re-evaluated:Patient Re-evaluated prior to inductionOxygen Delivery Method: Nasal cannula

## 2015-10-17 NOTE — Discharge Instructions (Signed)
AMBULATORY SURGERY  °DISCHARGE INSTRUCTIONS ° ° °1) The drugs that you were given will stay in your system until tomorrow so for the next 24 hours you should not: ° °A) Drive an automobile °B) Make any legal decisions °C) Drink any alcoholic beverage ° ° °2) You may resume regular meals tomorrow.  Today it is better to start with liquids and gradually work up to solid foods. ° °You may eat anything you prefer, but it is better to start with liquids, then soup and crackers, and gradually work up to solid foods. ° ° °3) Please notify your doctor immediately if you have any unusual bleeding, trouble breathing, redness and pain at the surgery site, drainage, fever, or pain not relieved by medication. ° ° ° °4) Additional Instructions: ° ° ° ° ° ° ° °Please contact your physician with any problems or Same Day Surgery at [phone], Monday through Friday 6 am to 4 pm, or [place] at [place] Main number at [phone].AMBULATORY SURGERY  °DISCHARGE INSTRUCTIONS ° ° °5) The drugs that you were given will stay in your system until tomorrow so for the next 24 hours you should not: ° °D) Drive an automobile °E) Make any legal decisions °F) Drink any alcoholic beverage ° ° °6) You may resume regular meals tomorrow.  Today it is better to start with liquids and gradually work up to solid foods. ° °You may eat anything you prefer, but it is better to start with liquids, then soup and crackers, and gradually work up to solid foods. ° ° °7) Please notify your doctor immediately if you have any unusual bleeding, trouble breathing, redness and pain at the surgery site, drainage, fever, or pain not relieved by medication. ° ° ° °8) Additional Instructions: ° ° ° ° ° ° ° °Please contact your physician with any problems or Same Day Surgery at [phone], Monday through Friday 6 am to 4 pm, or [place] at [place] [facility] Main number at [phone]. °

## 2015-10-17 NOTE — Op Note (Signed)
OPERATIVE NOTE  Otto HerbKatherine J Mazurowski [id number] 10/17/2015   PREOPERATIVE DIAGNOSIS:  Nuclear Sclerotic Cataract Right Eye H25.11   POSTOPERATIVE DIAGNOSIS: Nuclear Sclerotic Cataract Right Eye H25.11          PROCEDURE:  Phacoemusification with posterior chamber intraocular lens placement of the right eye   LENS:   Implant Name Type Inv. Item Serial No. Manufacturer Lot No. LRB No. Used  LENS IMPL INTRAOC ZCB00 16.0 - [id number] Intraocular Lens LENS IMPL INTRAOC ZCB00 16.0 [id number]   Right 1       ULTRASOUND TIME: 31 %  of 0 minutes 47 seconds, CDE 6.1  SURGEON:  Deirdre Evenerhadwick R. Adalynne Steffensmeier, MD   ANESTHESIA:  Topical with tetracaine drops and 2% Xylocaine jelly, augmented with 1% preservative-free intracameral lidocaine.    COMPLICATIONS:  None.   DESCRIPTION OF PROCEDURE:  The patient was identified in the holding room and transported to the operating room and placed in the supine position under the operating microscope. Theright eye was identified as the operative eye and it was prepped and draped in the usual sterile ophthalmic fashion.   A 1 millimeter clear-corneal paracentesis was made at the 12:00 position.  0.5 ml of preservative-free 1% lidocaine was injected into the anterior chamber. The anterior chamber was filled with Viscoat viscoelastic.  A 2.4 millimeter keratome was used to make a near-clear corneal incision at the 9:00 position. A curvilinear capsulorrhexis was made with a cystotome and capsulorrhexis forceps.  Balanced salt solution was used to hydrodissect and hydrodelineate the nucleus.   Phacoemulsification was then used in stop and chop fashion to remove the lens nucleus and epinucleus.  The remaining cortex was then removed using the irrigation and aspiration handpiece. Provisc was then placed into the capsular bag to distend it for lens placement.  A lens was then injected into the capsular bag.  The remaining viscoelastic was aspirated.  Wounds were  hydrated with balanced salt solution.  The anterior chamber was inflated to a physiologic pressure with balanced salt solution. Cefuroxime 0.1 ml of a 10mg /ml solution was injected into the anterior chamber for a dose of 1 mg of intracameral antibiotic at the completion of the case. Miostat was placed into the anterior chamber to constrict the pupil.  No wound leaks were noted.  Topical Vigamox drops and Maxitrol ointment were applied to the eye.  The patient was taken to the recovery room in stable condition without complications of anesthesia or surgery.  Kadence Mikkelson 10/17/2015, 8:21 AM

## 2015-10-17 NOTE — Transfer of Care (Signed)
Immediate Anesthesia Transfer of Care Note  Patient: Christie Hanson  Procedure(s) Performed: Procedure(s) with comments: CATARACT EXTRACTION PHACO AND INTRAOCULAR LENS PLACEMENT (IOC) (Right) - US  00:30.5 AP   00:47.2 CDE  6.05 casette lot #[id number]  Patient Location: PACU  Anesthesia Type:MAC  Level of Consciousness: awake, alert  and oriented  Airway & Oxygen Therapy: Patient Spontanous Breathing  Post-op Assessment: Report given to RN and Post -op Vital signs reviewed and stable  Post vital signs: Reviewed and stable  Last Vitals:  Filed Vitals:   10/17/15 0645 10/17/15 0825  BP: 153/62 153/77  Pulse: 69 71  Temp: 37.1 C 36.1 C  Resp: 16 16    Complications: No apparent anesthesia complications

## 2015-10-17 NOTE — Anesthesia Preprocedure Evaluation (Signed)
Anesthesia Evaluation  Patient identified by MRN, date of birth, ID band Patient awake    Reviewed: Allergy & Precautions, H&P , NPO status , Patient's Chart, lab work & pertinent test results, reviewed documented beta blocker date and time   Airway Mallampati: II  TM Distance: >3 FB Neck ROM: full    Dental no notable dental hx.    Pulmonary neg pulmonary ROS, asthma ,    Pulmonary exam normal breath sounds clear to auscultation       Cardiovascular Exercise Tolerance: Good negative cardio ROS   Rhythm:regular Rate:Normal     Neuro/Psych PSYCHIATRIC DISORDERS  Neuromuscular disease negative neurological ROS  negative psych ROS   GI/Hepatic negative GI ROS, Neg liver ROS, GERD  ,  Endo/Other  negative endocrine ROS  Renal/GU negative Renal ROS  negative genitourinary   Musculoskeletal   Abdominal   Peds  Hematology negative hematology ROS (+)   Anesthesia Other Findings   Reproductive/Obstetrics negative OB ROS                             Anesthesia Physical Anesthesia Plan  ASA: III  Anesthesia Plan: MAC   Post-op Pain Management:    Induction:   Airway Management Planned:   Additional Equipment:   Intra-op Plan:   Post-operative Plan:   Informed Consent: I have reviewed the patients History and Physical, chart, labs and discussed the procedure including the risks, benefits and alternatives for the proposed anesthesia with the patient or authorized representative who has indicated his/her understanding and acceptance.   Dental Advisory Given  Plan Discussed with: CRNA  Anesthesia Plan Comments:         Anesthesia Quick Evaluation

## 2015-10-17 NOTE — H&P (Signed)
  The History and Physical notes are on paper, have been signed, and are to be scanned. The patient remains stable and unchanged from the H&P.   Previous H&P reviewed, patient examined, and there are no changes.  Christie Hanson 10/17/2015 7:25 AM  

## 2015-10-18 NOTE — Anesthesia Postprocedure Evaluation (Signed)
Anesthesia Post Note  Patient: Christie Hanson  Procedure(s) Performed: Procedure(s) (LRB): CATARACT EXTRACTION PHACO AND INTRAOCULAR LENS PLACEMENT (IOC) (Right)  Patient location during evaluation: PACU Anesthesia Type: MAC Level of consciousness: awake and alert Pain management: pain level controlled Vital Signs Assessment: post-procedure vital signs reviewed and stable Respiratory status: spontaneous breathing, nonlabored ventilation, respiratory function stable and patient connected to nasal cannula oxygen Cardiovascular status: stable and blood pressure returned to baseline Anesthetic complications: no    Last Vitals:  Filed Vitals:   10/17/15 0825 10/17/15 0832  BP: 153/77 152/70  Pulse: 71 69  Temp: 36.1 C   Resp: 16     Last Pain:  Filed Vitals:   10/18/15 0838  PainSc: 0-No pain                 Yevette EdwardsJames G Jasmynn Pfalzgraf

## 2016-01-21 ENCOUNTER — Telehealth: Payer: Self-pay

## 2016-01-21 NOTE — Telephone Encounter (Signed)
Patient called states needs order for labs before appointment on the 13th

## 2016-01-23 ENCOUNTER — Other Ambulatory Visit: Payer: Self-pay | Admitting: Family Medicine

## 2016-01-23 DIAGNOSIS — E559 Vitamin D deficiency, unspecified: Secondary | ICD-10-CM

## 2016-01-23 DIAGNOSIS — E538 Deficiency of other specified B group vitamins: Secondary | ICD-10-CM

## 2016-01-23 DIAGNOSIS — R739 Hyperglycemia, unspecified: Secondary | ICD-10-CM

## 2016-01-23 DIAGNOSIS — Z79899 Other long term (current) drug therapy: Secondary | ICD-10-CM

## 2016-01-23 DIAGNOSIS — E785 Hyperlipidemia, unspecified: Secondary | ICD-10-CM

## 2016-01-24 ENCOUNTER — Other Ambulatory Visit: Payer: Self-pay | Admitting: Family Medicine

## 2016-01-25 LAB — LIPID PANEL
CHOL/HDL RATIO: 3.5 ratio (ref 0.0–4.4)
CHOLESTEROL TOTAL: 156 mg/dL (ref 100–199)
HDL: 45 mg/dL (ref >39)
LDL CALC: 94 mg/dL (ref 0–99)
TRIGLYCERIDES: 86 mg/dL (ref 0–149)
VLDL CHOLESTEROL CAL: 17 mg/dL (ref 5–40)

## 2016-01-25 LAB — COMPREHENSIVE METABOLIC PANEL
ALK PHOS: 80 IU/L (ref 39–117)
ALT: 14 IU/L (ref 0–32)
AST: 15 IU/L (ref 0–40)
Albumin/Globulin Ratio: 1.7 (ref 1.1–2.5)
Albumin: 4.1 g/dL (ref 3.6–4.8)
BUN/Creatinine Ratio: 21 (ref 11–26)
BUN: 14 mg/dL (ref 8–27)
Bilirubin Total: 0.4 mg/dL (ref 0.0–1.2)
CALCIUM: 9.3 mg/dL (ref 8.7–10.3)
CO2: 25 mmol/L (ref 18–29)
CREATININE: 0.67 mg/dL (ref 0.57–1.00)
Chloride: 100 mmol/L (ref 96–106)
GFR calc Af Amer: 104 mL/min/{1.73_m2} (ref >59)
GFR, EST NON AFRICAN AMERICAN: 90 mL/min/{1.73_m2} (ref >59)
GLUCOSE: 103 mg/dL — AB (ref 65–99)
Globulin, Total: 2.4 g/dL (ref 1.5–4.5)
Potassium: 5.1 mmol/L (ref 3.5–5.2)
SODIUM: 141 mmol/L (ref 134–144)
Total Protein: 6.5 g/dL (ref 6.0–8.5)

## 2016-01-25 LAB — VITAMIN D 25 HYDROXY (VIT D DEFICIENCY, FRACTURES): VIT D 25 HYDROXY: 41 ng/mL (ref 30.0–100.0)

## 2016-01-25 LAB — HEMOGLOBIN A1C
Est. average glucose Bld gHb Est-mCnc: 114 mg/dL
Hgb A1c MFr Bld: 5.6 % (ref 4.8–5.6)

## 2016-01-25 LAB — VITAMIN B12: Vitamin B-12: 794 pg/mL (ref 211–946)

## 2016-01-27 ENCOUNTER — Ambulatory Visit (INDEPENDENT_AMBULATORY_CARE_PROVIDER_SITE_OTHER): Payer: Medicare Other | Admitting: Family Medicine

## 2016-01-27 ENCOUNTER — Encounter: Payer: Self-pay | Admitting: Family Medicine

## 2016-01-27 VITALS — BP 138/84 | HR 80 | Temp 98.5°F | Resp 18 | Ht 63.0 in | Wt 282.1 lb

## 2016-01-27 DIAGNOSIS — F330 Major depressive disorder, recurrent, mild: Secondary | ICD-10-CM | POA: Diagnosis not present

## 2016-01-27 DIAGNOSIS — I10 Essential (primary) hypertension: Secondary | ICD-10-CM

## 2016-01-27 DIAGNOSIS — E8881 Metabolic syndrome: Secondary | ICD-10-CM | POA: Diagnosis not present

## 2016-01-27 DIAGNOSIS — K219 Gastro-esophageal reflux disease without esophagitis: Secondary | ICD-10-CM

## 2016-01-27 DIAGNOSIS — R739 Hyperglycemia, unspecified: Secondary | ICD-10-CM

## 2016-01-27 DIAGNOSIS — E559 Vitamin D deficiency, unspecified: Secondary | ICD-10-CM

## 2016-01-27 DIAGNOSIS — M25562 Pain in left knee: Secondary | ICD-10-CM

## 2016-01-27 DIAGNOSIS — R3915 Urgency of urination: Secondary | ICD-10-CM | POA: Diagnosis not present

## 2016-01-27 DIAGNOSIS — D692 Other nonthrombocytopenic purpura: Secondary | ICD-10-CM | POA: Diagnosis not present

## 2016-01-27 DIAGNOSIS — J4540 Moderate persistent asthma, uncomplicated: Secondary | ICD-10-CM | POA: Diagnosis not present

## 2016-01-27 MED ORDER — ALBUTEROL SULFATE HFA 108 (90 BASE) MCG/ACT IN AERS: 2.0000 | INHALATION_SPRAY | Freq: Four times a day (QID) | RESPIRATORY_TRACT | Status: DC | PRN

## 2016-01-27 MED ORDER — BREO ELLIPTA 100-25 MCG/INH IN AEPB: 1.0000 | INHALATION_SPRAY | Freq: Every day | RESPIRATORY_TRACT | Status: DC
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
## 2016-01-29 LAB — URINE CULTURE: Organism ID, Bacteria: NO GROWTH

## 2016-02-03 ENCOUNTER — Ambulatory Visit
Admission: RE | Admit: 2016-02-03 | Discharge: 2016-02-03 | Disposition: A | Discharge status: Home / Self Care | Payer: Medicare Other | Source: Ambulatory Visit | Attending: Family Medicine | Admitting: Family Medicine

## 2016-02-03 DIAGNOSIS — Z1231 Encounter for screening mammogram for malignant neoplasm of breast: Secondary | ICD-10-CM | POA: Insufficient documentation

## 2016-02-03 DIAGNOSIS — Z1239 Encounter for other screening for malignant neoplasm of breast: Secondary | ICD-10-CM

## 2016-02-04 ENCOUNTER — Other Ambulatory Visit: Payer: Self-pay | Admitting: Family Medicine

## 2016-02-04 DIAGNOSIS — R928 Other abnormal and inconclusive findings on diagnostic imaging of breast: Secondary | ICD-10-CM

## 2016-02-13 ENCOUNTER — Ambulatory Visit
Admission: RE | Admit: 2016-02-13 | Discharge: 2016-02-13 | Disposition: A | Discharge status: Home / Self Care | Payer: Medicare Other | Source: Ambulatory Visit | Attending: Family Medicine | Admitting: Family Medicine

## 2016-02-13 DIAGNOSIS — R928 Other abnormal and inconclusive findings on diagnostic imaging of breast: Secondary | ICD-10-CM | POA: Diagnosis present

## 2016-04-21 ENCOUNTER — Encounter: Payer: Self-pay | Admitting: Family Medicine

## 2016-07-29 ENCOUNTER — Encounter: Payer: Self-pay | Admitting: Family Medicine

## 2016-07-29 ENCOUNTER — Ambulatory Visit (INDEPENDENT_AMBULATORY_CARE_PROVIDER_SITE_OTHER): Payer: Medicare Other | Admitting: Family Medicine

## 2016-07-29 VITALS — BP 128/84 | HR 86 | Temp 98.1°F | Resp 18 | Ht 63.0 in | Wt 280.2 lb

## 2016-07-29 DIAGNOSIS — J4540 Moderate persistent asthma, uncomplicated: Secondary | ICD-10-CM

## 2016-07-29 DIAGNOSIS — M1712 Unilateral primary osteoarthritis, left knee: Secondary | ICD-10-CM

## 2016-07-29 DIAGNOSIS — K219 Gastro-esophageal reflux disease without esophagitis: Secondary | ICD-10-CM

## 2016-07-29 DIAGNOSIS — E538 Deficiency of other specified B group vitamins: Secondary | ICD-10-CM

## 2016-07-29 DIAGNOSIS — Z23 Encounter for immunization: Secondary | ICD-10-CM | POA: Diagnosis not present

## 2016-07-29 DIAGNOSIS — F325 Major depressive disorder, single episode, in full remission: Secondary | ICD-10-CM

## 2016-07-29 DIAGNOSIS — D692 Other nonthrombocytopenic purpura: Secondary | ICD-10-CM | POA: Diagnosis not present

## 2016-07-29 DIAGNOSIS — E8881 Metabolic syndrome: Secondary | ICD-10-CM | POA: Diagnosis not present

## 2016-07-29 MED ORDER — BREO ELLIPTA 100-25 MCG/INH IN AEPB: 1.0000 | INHALATION_SPRAY | Freq: Every day | RESPIRATORY_TRACT | 5 refills | Status: DC

## 2016-07-29 MED ORDER — MELOXICAM 15 MG PO TABS: 15.0000 mg | ORAL_TABLET | Freq: Every day | ORAL | 5 refills | Status: DC

## 2016-07-29 NOTE — Progress Notes (Signed)
Name: Christie Hanson   MRN: [id number]    DOB: [birth year]   Date:07/29/2016       Progress Note  Subjective  Chief Complaint  Chief Complaint  Patient presents with  . Asthma    pt here for 6 month follow up    HPI   Metabolic Syndrome: glucose at home has been around 90's, no polyphagia, polydipsia or polyuria.  Asthma Moderate: she states that she had a flare two  Winters ago. She is compliant with Breo and states she has occasional dry cough or wheezing. No SOB.   GERD: taking Omeprazole daily, no heartburn,very seldom she has regurgitation, improved with dietary modification, avoiding fried food.  Depression: she stopped taking all medication about one year ago, she is doing well, no crying spells, she denies anhedonia, she enjoys sowing, also hanging out with her sister, shopping.   Obesity: weight is stable, mild drop in weight, trying to eat better, able to be more active since she has steroid injection of left knee.   Vitamin D and Vitamin B12 deficiency: back to normal still on supplementation.  OA: left knee hurts, no effusion, she has daily pain, takes Tylenol prn He was seen by Dr. Thurston HoleWainer May 2017 and is doing well since started on Meloxicam and had steroid injection , she is feeling very well.  Patient Active Problem List   Diagnosis Date Noted  . Vitamin B12 deficiency 07/29/2015  . Carpal tunnel syndrome 07/27/2015  . Osteoarthritis 07/27/2015  . Depression, major, recurrent, mild (HCC) 07/27/2015  . Gastro-esophageal reflux disease without esophagitis 07/27/2015  . Displacement of cervical intervertebral disc without myelopathy 07/27/2015  . Blood glucose elevated 07/27/2015  . Dysmetabolic syndrome 07/27/2015  . Vitamin D deficiency 07/27/2015  . Sprain of rotator cuff capsule 07/27/2015  . Urgency of urination 07/27/2015  . Allergic rhinitis 07/27/2015  . Asthma, moderate persistent 07/27/2015    Past Surgical History:  Procedure Laterality  Date  . ABDOMINAL HYSTERECTOMY  1991  . BILATERAL SALPINGOOPHORECTOMY    . CARPAL TUNNEL RELEASE Bilateral [id number]   [facility]  . CATARACT EXTRACTION W/PHACO Left 09/23/2015   Procedure: CATARACT EXTRACTION PHACO AND INTRAOCULAR LENS PLACEMENT (IOC);  Surgeon: Lockie Molahadwick Brasington, MD;  Location: [facility] ORS;  Service: Ophthalmology;  Laterality: Left;  US 00:57 AP% 12.2 6.98 CDE Fluid pack lot # [id number]  . CATARACT EXTRACTION W/PHACO Right 10/17/2015   Procedure: CATARACT EXTRACTION PHACO AND INTRAOCULAR LENS PLACEMENT (IOC);  Surgeon: Lockie Molahadwick Brasington, MD;  Location: [facility] ORS;  Service: Ophthalmology;  Laterality: Right;  US  00:30.5 AP   00:47.2 CDE  6.05 casette lot #[id number]  . DandC    . DILATION AND CURETTAGE OF UTERUS  1990  . HERNIA REPAIR  1992   ventral    Family History  Problem Relation Age of Onset  . Hypertension Mother   . Cancer Father     prostate  . CVA Father   . Depression Father   . Depression Brother   . Cancer Brother     prostate  . Depression Brother     Social History   Social History  . Marital status: Married    Spouse name: N/A  . Number of children: N/A  . Years of education: N/A   Occupational History  . Not on file.   Social History Main Topics  . Smoking status: Never Smoker  . Smokeless tobacco: Never Used  . Alcohol use No  . Drug use: No  . Sexual activity: Yes  Partners: Male   Other Topics Concern  . Not on file   Social History Narrative  . No narrative on file     Current Outpatient Prescriptions:  .  acetaminophen (TYLENOL) 500 MG tablet, Take 1,000 mg by mouth at bedtime. , Disp: , Rfl:  .  albuterol (PROVENTIL HFA;VENTOLIN HFA) 108 (90 Base) MCG/ACT inhaler, Inhale 2 puffs into the lungs every 6 (six) hours as needed for wheezing or shortness of breath., Disp: 1 Inhaler, Rfl: 1 .  BREO ELLIPTA 100-25 MCG/INH AEPB, Inhale 1 puff into the lungs daily., Disp: 1 each, Rfl: 5 .  Cholecalciferol (VITAMIN D) 2000 UNITS  tablet, Take 1 tablet by mouth daily. Bedtime, Disp: , Rfl:  .  esomeprazole (NEXIUM) 20 MG capsule, Take by mouth at bedtime., Disp: , Rfl:  .  meloxicam (MOBIC) 15 MG tablet, Take 1 tablet (15 mg total) by mouth daily., Disp: 30 tablet, Rfl: 5 .  Multiple Vitamins-Minerals (ICAPS AREDS 2) CAPS, Take 1 capsule by mouth daily., Disp: , Rfl:  .  Probiotic Product (SOLUBLE FIBER/PROBIOTICS PO), Take 1 capsule by mouth daily., Disp: , Rfl:  .  vitamin B-12 (CYANOCOBALAMIN) 1000 MCG tablet, Take 1,000 mcg by mouth daily. Bedtime, Disp: , Rfl:   Allergies  Allergen Reactions  . Morphine   . Penicillins   . Sulfa Antibiotics      ROS  Constitutional: Negative for fever or weight change.  Respiratory: Negative  for cough and shortness of breath.   Cardiovascular: Negative for chest pain or palpitations.  Gastrointestinal: Negative for abdominal pain, no bowel changes.  Musculoskeletal: Negative for gait problem or joint swelling.  Skin: Negative for rash.  Neurological: Negative for dizziness or headache.  No other specific complaints in a complete review of systems (except as listed in HPI above).  Objective  Vitals:   07/29/16 1327  BP: 128/84  Pulse: 86  Resp: 18  Temp: 98.1 F (36.7 C)  SpO2: 94%  Weight: 280 lb 4 oz (127.1 kg)  Height: 5\' 3"  (1.6 m)    Body mass index is 49.64 kg/m.  Physical Exam  Constitutional: Patient appears well-developed and well-nourished. Obese  No distress.  HEENT: head atraumatic, normocephalic, pupils equal and reactive to light, neck supple, throat within normal limits Cardiovascular: Normal rate, regular rhythm and normal heart sounds.  No murmur heard. No BLE edema. Pulmonary/Chest: Effort normal and breath sounds normal. No respiratory distress. Abdominal: Soft.  There is no tenderness. Psychiatric: Patient has a normal mood and affect. behavior is normal. Judgment and thought content normal.   PHQ2/9: Depression screen [facility] 2/9  07/29/2016 01/27/2016 07/29/2015  Decreased Interest 0 0 0  Down, Depressed, Hopeless 0 0 1  PHQ - 2 Score 0 0 1     Fall Risk: Fall Risk  07/29/2016 01/27/2016 07/29/2015  Falls in the past year? No Yes No  Number falls in past yr: - 1 -  Injury with Fall? - No -     Functional Status Survey: Is the patient deaf or have difficulty hearing?: No Does the patient have difficulty seeing, even when wearing glasses/contacts?: No Does the patient have difficulty concentrating, remembering, or making decisions?: No Does the patient have difficulty walking or climbing stairs?: Yes Does the patient have difficulty dressing or bathing?: No Does the patient have difficulty doing errands alone such as visiting a doctor's office or shopping?: No    Assessment & Plan  1. Asthma, moderate persistent, uncomplicated  - BREO ELLIPTA 100-25  MCG/INH AEPB; Inhale 1 puff into the lungs daily.  Dispense: 1 each; Refill: 5  2. Dysmetabolic syndrome  We will hold off on labs until next follow up  3. Depression, major, in remission [facility])  She has been off medication and is doing well   4. Senile purpura (HCC)  reassurance  5. Gastro-esophageal reflux disease without esophagitis  Doing well at this time  6. Vitamin B12 deficiency  Taking B12 otc  7. Primary osteoarthritis of left knee  She is doing well  - meloxicam (MOBIC) 15 MG tablet; Take 1 tablet (15 mg total) by mouth daily.  Dispense: 30 tablet; Refill: 5  8. Needs flu shot  - Flu vaccine HIGH DOSE PF -refused   9. Need for shingles vaccine  - Varicella-zoster vaccine subcutaneous -refused

## 2016-11-22 ENCOUNTER — Encounter: Payer: Self-pay | Admitting: Family Medicine

## 2016-11-23 ENCOUNTER — Other Ambulatory Visit: Payer: Self-pay | Admitting: Family Medicine

## 2016-11-24 ENCOUNTER — Telehealth: Payer: Self-pay | Admitting: Family Medicine

## 2016-11-24 ENCOUNTER — Other Ambulatory Visit: Payer: Self-pay

## 2016-11-24 NOTE — Telephone Encounter (Signed)
Patient is requesting to pick up the shinge vaccination order to take to her pharmacy. Please read pt mychart email. Would like to pick up today around lunch. She is not able to do it here due to insurance purposes.

## 2016-11-24 NOTE — Progress Notes (Unsigned)
zosta

## 2017-01-25 ENCOUNTER — Ambulatory Visit: Payer: Medicare Other | Admitting: Family Medicine

## 2017-01-26 ENCOUNTER — Ambulatory Visit: Payer: Medicare Other | Admitting: Family Medicine

## 2017-03-02 ENCOUNTER — Ambulatory Visit (INDEPENDENT_AMBULATORY_CARE_PROVIDER_SITE_OTHER): Payer: Medicare Other | Admitting: Family Medicine

## 2017-03-02 ENCOUNTER — Encounter: Payer: Self-pay | Admitting: Family Medicine

## 2017-03-02 ENCOUNTER — Telehealth: Payer: Self-pay | Admitting: Family Medicine

## 2017-03-02 ENCOUNTER — Other Ambulatory Visit: Payer: Self-pay | Admitting: Family Medicine

## 2017-03-02 VITALS — BP 138/86 | HR 86 | Temp 98.0°F | Resp 16 | Ht 63.0 in | Wt 287.1 lb

## 2017-03-02 DIAGNOSIS — J4540 Moderate persistent asthma, uncomplicated: Secondary | ICD-10-CM

## 2017-03-02 DIAGNOSIS — M1712 Unilateral primary osteoarthritis, left knee: Secondary | ICD-10-CM | POA: Diagnosis not present

## 2017-03-02 DIAGNOSIS — R739 Hyperglycemia, unspecified: Secondary | ICD-10-CM

## 2017-03-02 DIAGNOSIS — M150 Primary generalized (osteo)arthritis: Secondary | ICD-10-CM | POA: Diagnosis not present

## 2017-03-02 DIAGNOSIS — E8881 Metabolic syndrome: Secondary | ICD-10-CM | POA: Diagnosis not present

## 2017-03-02 DIAGNOSIS — Z79899 Other long term (current) drug therapy: Secondary | ICD-10-CM

## 2017-03-02 DIAGNOSIS — F330 Major depressive disorder, recurrent, mild: Secondary | ICD-10-CM

## 2017-03-02 DIAGNOSIS — D692 Other nonthrombocytopenic purpura: Secondary | ICD-10-CM

## 2017-03-02 DIAGNOSIS — G629 Polyneuropathy, unspecified: Secondary | ICD-10-CM

## 2017-03-02 DIAGNOSIS — K219 Gastro-esophageal reflux disease without esophagitis: Secondary | ICD-10-CM | POA: Diagnosis not present

## 2017-03-02 DIAGNOSIS — E538 Deficiency of other specified B group vitamins: Secondary | ICD-10-CM

## 2017-03-02 DIAGNOSIS — M159 Polyosteoarthritis, unspecified: Secondary | ICD-10-CM

## 2017-03-02 DIAGNOSIS — E559 Vitamin D deficiency, unspecified: Secondary | ICD-10-CM

## 2017-03-02 MED ORDER — ESOMEPRAZOLE MAGNESIUM 40 MG PO CPDR: 40.0000 mg | DELAYED_RELEASE_CAPSULE | Freq: Every day | ORAL | 5 refills | Status: DC

## 2017-03-02 MED ORDER — DULOXETINE HCL 30 MG PO CPEP: 30.0000 mg | ORAL_CAPSULE | Freq: Every day | ORAL | 3 refills | Status: DC

## 2017-03-02 MED ORDER — MELOXICAM 15 MG PO TABS: 15.0000 mg | ORAL_TABLET | Freq: Every day | ORAL | 5 refills | Status: DC

## 2017-03-02 MED ORDER — BREO ELLIPTA 100-25 MCG/INH IN AEPB: 1.0000 | INHALATION_SPRAY | Freq: Every day | RESPIRATORY_TRACT | 5 refills | Status: DC

## 2017-03-02 NOTE — Progress Notes (Signed)
Name: Christie Hanson   MRN: [id number]    DOB: 08-19-1946   Date:03/02/2017       Progress Note  Subjective  Chief Complaint  Chief Complaint  Patient presents with  . Medication Refill    6 month F/U  . Asthma    Still wheezes from time to time. Uses Breo every day and has not had to use her rescue inhaler lately  . Gastroesophageal Reflux    Needs refill of Nexium  . Osteoarthritis    Left Knee is feeling better with Meloxicam   . Depression    HPI  Metabolic Syndrome: glucose at home has not been checked lately.  Vision has improved since had cataract surgery.   Asthma Moderate: not using rescue inhaler. She is compliant with Breo. She has noticed some wheezing a few times per week when she goes to bed.  No SOB or decrease in exercise tolerance. Stable  GERD: taking Nexium  daily, no heartburn, occasionally has regurgitation and is usually triggered by what she eats. Avoids eating before going to bed. She needs a prescription. Tried Omeprazole otc but it did not control symptoms  Neuropathy: she states she has burning sensation on both feet for many years, last B12 at goal, symptoms are getting worse, and aggravated by walking. She is willing to try Cymbalta.   Depression: she stopped taking all medication back in 2017, she was doing well, but more stress over the holidays and also husband just got pacemaker, pain has not helped with her mood. We will try Cymbalta, Took Prozac and Abilify for many years. She stopped because of side effects  Obesity: weight is stable, she has some joint problems.   Vitamin D and Vitamin B12 deficiency: back to normal still on supplementation.  OA: left knee hurts all the time, she tried weaning self off and pain got much worse, no pain while at rest but pain is intense when standing or walking about one hour, no effusion, she has daily pain, takes Tylenol prn  HTN: bp is at goal at this time, and she does not want to add  medication   Patient Active Problem List   Diagnosis Date Noted  . Vitamin B12 deficiency 07/29/2015  . Carpal tunnel syndrome 07/27/2015  . Osteoarthritis 07/27/2015  . Depression, major, recurrent, mild (HCC) 07/27/2015  . Gastro-esophageal reflux disease without esophagitis 07/27/2015  . Displacement of cervical intervertebral disc without myelopathy 07/27/2015  . Blood glucose elevated 07/27/2015  . Dysmetabolic syndrome 07/27/2015  . Vitamin D deficiency 07/27/2015  . Sprain of rotator cuff capsule 07/27/2015  . Urgency of urination 07/27/2015  . Allergic rhinitis 07/27/2015  . Asthma, moderate persistent 07/27/2015    Past Surgical History:  Procedure Laterality Date  . ABDOMINAL HYSTERECTOMY  1991  . BILATERAL SALPINGOOPHORECTOMY    . CARPAL TUNNEL RELEASE Bilateral [id number]   [facility]  . CATARACT EXTRACTION W/PHACO Left 09/23/2015   Procedure: CATARACT EXTRACTION PHACO AND INTRAOCULAR LENS PLACEMENT (IOC);  Surgeon: Lockie Mola, MD;  Location: [facility];  Service: Ophthalmology;  Laterality: Left;  [place] 00:57 AP% 12.2 6.98 CDE Fluid pack lot # [id number]  . CATARACT EXTRACTION W/PHACO Right 10/17/2015   Procedure: CATARACT EXTRACTION PHACO AND INTRAOCULAR LENS PLACEMENT (IOC);  Surgeon: Lockie Mola, MD;  Location: [facility];  Service: Ophthalmology;  Laterality: Right;  [place]  00:30.5 AP   00:47.2 CDE  6.05 casette lot #[id number]  . DandC    . DILATION AND CURETTAGE OF UTERUS  1990  .  HERNIA REPAIR  1992   ventral    Family History  Problem Relation Age of Onset  . Hypertension Mother   . Cancer Father     prostate  . CVA Father   . Depression Father   . Depression Brother   . Cancer Brother     prostate  . Depression Brother     Social History   Social History  . Marital status: Married    Spouse name: N/A  . Number of children: N/A  . Years of education: N/A   Occupational History  . Not on file.   Social History Main Topics  . Smoking  status: Never Smoker  . Smokeless tobacco: Never Used  . Alcohol use No  . Drug use: No  . Sexual activity: Yes    Partners: Male   Other Topics Concern  . Not on file   Social History Narrative  . No narrative on file     Current Outpatient Prescriptions:  .  acetaminophen (TYLENOL) 500 MG tablet, Take 1,000 mg by mouth at bedtime. , Disp: , Rfl:  .  albuterol (PROVENTIL HFA;VENTOLIN HFA) 108 (90 Base) MCG/ACT inhaler, Inhale 2 puffs into the lungs every 6 (six) hours as needed for wheezing or shortness of breath., Disp: 1 Inhaler, Rfl: 1 .  BREO ELLIPTA 100-25 MCG/INH AEPB, Inhale 1 puff into the lungs daily., Disp: 1 each, Rfl: 5 .  Cholecalciferol (VITAMIN D) 2000 UNITS tablet, Take 1 tablet by mouth daily. Bedtime, Disp: , Rfl:  .  esomeprazole (NEXIUM) 40 MG capsule, Take 1 capsule (40 mg total) by mouth at bedtime., Disp: 30 capsule, Rfl: 5 .  meloxicam (MOBIC) 15 MG tablet, Take 1 tablet (15 mg total) by mouth daily., Disp: 30 tablet, Rfl: 5 .  Multiple Vitamins-Minerals (ICAPS AREDS 2) CAPS, Take 2 capsules by mouth daily. , Disp: , Rfl:  .  Probiotic Product (SOLUBLE FIBER/PROBIOTICS PO), Take 1 capsule by mouth daily., Disp: , Rfl:  .  vitamin B-12 (CYANOCOBALAMIN) 1000 MCG tablet, Take 1,000 mcg by mouth daily. Bedtime, Disp: , Rfl:   Allergies  Allergen Reactions  . Morphine   . Penicillins   . Sulfa Antibiotics      ROS  Constitutional: Negative for fever or weight change.  Respiratory: Negative for cough and shortness of breath.   Cardiovascular: Negative for chest pain or palpitations.  Gastrointestinal: Negative for abdominal pain, no bowel changes.  Musculoskeletal: Negative for gait problem or joint swelling.  Skin: Negative for rash.  Neurological: Negative for dizziness or headache.  No other specific complaints in a complete review of systems (except as listed in HPI above).  Objective  Vitals:   03/02/17 1434  BP: 138/86  Pulse: 86  Resp: 16   Temp: 98 F (36.7 C)  TempSrc: Oral  SpO2: 97%  Weight: 287 lb 1.6 oz (130.2 kg)  Height:  (1.6 m)    Body mass index is 50.86 kg/m.  Physical Exam  Constitutional: Patient appears well-developed and well-nourished. Obese No distress.  HEENT: head atraumatic, normocephalic, pupils equal and reactive to light,  neck supple, throat within normal limits Cardiovascular: Normal rate, regular rhythm and normal heart sounds.  No murmur heard. No BLE edema. Pulmonary/Chest: Effort normal and breath sounds normal. No respiratory distress. Abdominal: Soft.  There is no tenderness. Psychiatric: Patient has a normal mood and affect. behavior is normal. Judgment and thought content normal. Muscular Skeletal: crepitus with extension    PHQ2/9: Depression screen PHQ  2/9 03/02/2017 07/29/2016 01/27/2016 07/29/2015  Decreased Interest 0 0 0 0  Down, Depressed, Hopeless 0 0 0 1  PHQ - 2 Score 0 0 0 1    Fall Risk: Fall Risk  03/02/2017 07/29/2016 01/27/2016 07/29/2015  Falls in the past year? No No Yes No  Number falls in past yr: - - 1 -  Injury with Fall? - - No -     Functional Status Survey: Is the patient deaf or have difficulty hearing?: No Does the patient have difficulty seeing, even when wearing glasses/contacts?: No Does the patient have difficulty concentrating, remembering, or making decisions?: No Does the patient have difficulty walking or climbing stairs?: No Does the patient have difficulty dressing or bathing?: No Does the patient have difficulty doing errands alone such as visiting a doctor's office or shopping?: No    Assessment & Plan  1. Dysmetabolic syndrome   2. Depression, major, recurrent, mild (HCC)  - DULoxetine (CYMBALTA) 30 MG capsule; Take 1-2 capsules (30-60 mg total) by mouth daily. One daily first week, after that 2 daily  Dispense: 60 capsule; Refill: 3  3. Senile purpura (HCC)  stable  4. Gastro-esophageal reflux disease without  esophagitis  - esomeprazole (NEXIUM) 40 MG capsule; Take 1 capsule (40 mg total) by mouth at bedtime.  Dispense: 30 capsule; Refill: 5  5. Vitamin B12 deficiency  Continue medication   6. Vitamin D deficiency  Continue supplementation   7. Primary osteoarthritis involving multiple joints  - meloxicam (MOBIC) 15 MG tablet; Take 1 tablet (15 mg total) by mouth daily.  Dispense: 30 tablet; Refill: 5  8. Primary osteoarthritis of left knee  - meloxicam (MOBIC) 15 MG tablet; Take 1 tablet (15 mg total) by mouth daily.  Dispense: 30 tablet; Refill: 5  9. Moderate persistent asthma without complication  - BREO ELLIPTA 100-25 MCG/INH AEPB; Inhale 1 puff into the lungs daily.  Dispense: 1 each; Refill: 5  10. Peripheral polyneuropathy  - DULoxetine (CYMBALTA) 30 MG capsule; Take 1-2 capsules (30-60 mg total) by mouth daily. One daily first week, after that 2 daily  Dispense: 60 capsule; Refill: 3

## 2017-03-02 NOTE — Telephone Encounter (Signed)
PT IS ASKING IF YOU WANTED BLOOD WORK FOR NOTHING WAS ORDERED. dO YOU WANT HERE TO HAVE BLOOD WORK DONE ON NEXT VISIT.

## 2017-03-02 NOTE — Addendum Note (Signed)
Addended by: Cynda Familia on: 03/02/2017 04:54 PM   Modules accepted: Orders

## 2017-03-09 ENCOUNTER — Other Ambulatory Visit: Payer: Self-pay | Admitting: Family Medicine

## 2017-03-09 DIAGNOSIS — J4540 Moderate persistent asthma, uncomplicated: Secondary | ICD-10-CM

## 2017-09-02 ENCOUNTER — Other Ambulatory Visit: Payer: Self-pay | Admitting: Family Medicine

## 2017-09-02 DIAGNOSIS — K219 Gastro-esophageal reflux disease without esophagitis: Secondary | ICD-10-CM

## 2017-09-02 NOTE — Telephone Encounter (Signed)
Patient [facility].   Last visit was 03/02/2017.  Next visit is on 09/03/2017.

## 2017-09-03 ENCOUNTER — Encounter: Payer: Self-pay | Admitting: Family Medicine

## 2017-09-03 ENCOUNTER — Ambulatory Visit (INDEPENDENT_AMBULATORY_CARE_PROVIDER_SITE_OTHER): Payer: Medicare Other | Admitting: Family Medicine

## 2017-09-03 VITALS — BP 150/90 | HR 80 | Resp 14 | Ht 63.0 in | Wt 286.1 lb

## 2017-09-03 DIAGNOSIS — E559 Vitamin D deficiency, unspecified: Secondary | ICD-10-CM

## 2017-09-03 DIAGNOSIS — K219 Gastro-esophageal reflux disease without esophagitis: Secondary | ICD-10-CM

## 2017-09-03 DIAGNOSIS — E8881 Metabolic syndrome: Secondary | ICD-10-CM | POA: Diagnosis not present

## 2017-09-03 DIAGNOSIS — R5383 Other fatigue: Secondary | ICD-10-CM | POA: Diagnosis not present

## 2017-09-03 DIAGNOSIS — R030 Elevated blood-pressure reading, without diagnosis of hypertension: Secondary | ICD-10-CM | POA: Diagnosis not present

## 2017-09-03 DIAGNOSIS — Z23 Encounter for immunization: Secondary | ICD-10-CM

## 2017-09-03 DIAGNOSIS — D692 Other nonthrombocytopenic purpura: Secondary | ICD-10-CM

## 2017-09-03 DIAGNOSIS — G629 Polyneuropathy, unspecified: Secondary | ICD-10-CM

## 2017-09-03 DIAGNOSIS — R739 Hyperglycemia, unspecified: Secondary | ICD-10-CM | POA: Diagnosis not present

## 2017-09-03 DIAGNOSIS — Z79899 Other long term (current) drug therapy: Secondary | ICD-10-CM

## 2017-09-03 DIAGNOSIS — M150 Primary generalized (osteo)arthritis: Secondary | ICD-10-CM

## 2017-09-03 DIAGNOSIS — M159 Polyosteoarthritis, unspecified: Secondary | ICD-10-CM

## 2017-09-03 DIAGNOSIS — J4540 Moderate persistent asthma, uncomplicated: Secondary | ICD-10-CM

## 2017-09-03 DIAGNOSIS — F330 Major depressive disorder, recurrent, mild: Secondary | ICD-10-CM | POA: Diagnosis not present

## 2017-09-03 MED ORDER — MELOXICAM 15 MG PO TABS: 15.0000 mg | ORAL_TABLET | Freq: Every day | ORAL | 5 refills | Status: DC

## 2017-09-03 MED ORDER — BREO ELLIPTA 100-25 MCG/INH IN AEPB
1.0000 | INHALATION_SPRAY | Freq: Every day | RESPIRATORY_TRACT | 5 refills | Status: DC
Start: 2017-09-03 — End: 2018-03-14

## 2017-09-03 NOTE — Progress Notes (Signed)
Name: Christie Hanson   MRN: [id number]    DOB: 26-Jul-1946   Date:09/03/2017       Progress Note  Subjective  Chief Complaint  Chief Complaint  Patient presents with  . Medication Refill    6 month F/U  . Asthma  . Depression    HPI  Metabolic Syndrome: last hgbA1C was 5.6%, she denies polyphagia, polydipsia or polyuria. Glucose at home has been below 120  Other fatigue: she feels like she is either hot or cold and als feels tired, we will check labs   Asthma Moderate: not using rescue inhaler. She is compliant with Breo. She states takes Loratadine during allergy season, having wheezing or chest tightness about once a month when exposure to mold/or increase in humidity.  No SOB or decrease in exercise tolerance.  GERD: taking Nexium  daily, no heartburn, occasionally has regurgitation and is usually triggered by what she eats. Avoids eating before going to bed. She needs a prescription.   Neuropathy: she states she has burning sensation on both feet for many years, last B12 at goal, she states that she has been avoiding standing or walking long term and is doing better, could not tolerate Cymbalta and stopped on her own.   Depression: she stopped taking all medication back in 2017, she was doing well, but more stress over the holidays and also husband just got pacemaker, pain has not helped with her mood.Took Prozac and Abilify for many years. She stopped because of side effects, like tremors - resolved now. Tried cymbalta but it made her feel "weird" and stopped medication, it made her feel anxious.    Obesity: weight is stable, she has some joint problems, she will resume going to [facility] for water aerobics  Vitamin D and Vitamin B12 deficiency: back to normal still on supplementation.  OA: left knee hurts all the time, she tried weaning self off Meloxicam and pain got much worse, no pain while at rest but pain is intense when standing or walking about one hour, no  effusion, she has daily pain, takes Tylenol prn  HTN: bp is at goal at this time, and she does not want to add medication   Patient Active Problem List   Diagnosis Date Noted  . Peripheral polyneuropathy 03/02/2017  . Vitamin B12 deficiency 07/29/2015  . Carpal tunnel syndrome 07/27/2015  . Osteoarthritis 07/27/2015  . Depression, major, recurrent, mild (HCC) 07/27/2015  . Gastro-esophageal reflux disease without esophagitis 07/27/2015  . Displacement of cervical intervertebral disc without myelopathy 07/27/2015  . Blood glucose elevated 07/27/2015  . Dysmetabolic syndrome 07/27/2015  . Vitamin D deficiency 07/27/2015  . Sprain of rotator cuff capsule 07/27/2015  . Urgency of urination 07/27/2015  . Allergic rhinitis 07/27/2015  . Asthma, moderate persistent 07/27/2015    Past Surgical History:  Procedure Laterality Date  . ABDOMINAL HYSTERECTOMY  1991  . BILATERAL SALPINGOOPHORECTOMY    . CARPAL TUNNEL RELEASE Bilateral [id number]   [facility]  . CATARACT EXTRACTION W/PHACO Left 09/23/2015   Procedure: CATARACT EXTRACTION PHACO AND INTRAOCULAR LENS PLACEMENT (IOC);  Surgeon: Lockie Mola, MD;  Location: [facility];  Service: Ophthalmology;  Laterality: Left;  [place] 00:57 AP% 12.2 6.98 CDE Fluid pack lot # [id number]  . CATARACT EXTRACTION W/PHACO Right 10/17/2015   Procedure: CATARACT EXTRACTION PHACO AND INTRAOCULAR LENS PLACEMENT (IOC);  Surgeon: Lockie Mola, MD;  Location: [facility];  Service: Ophthalmology;  Laterality: Right;  [place]  00:30.5 AP   00:47.2 CDE  6.05 casette lot #[id number]  .  DandC    . DILATION AND CURETTAGE OF UTERUS  1990  . HERNIA REPAIR  1992   ventral    Family History  Problem Relation Age of Onset  . Hypertension Mother   . Cancer Father        prostate  . CVA Father   . Depression Father   . Depression Brother   . Cancer Brother        prostate  . Depression Brother     Social History   Social History  . Marital status: Married     Spouse name: N/A  . Number of children: N/A  . Years of education: N/A   Occupational History  . Not on file.   Social History Main Topics  . Smoking status: Never Smoker  . Smokeless tobacco: Never Used  . Alcohol use No  . Drug use: No  . Sexual activity: Yes    Partners: Male   Other Topics Concern  . Not on file   Social History Narrative  . No narrative on file     Current Outpatient Prescriptions:  .  acetaminophen (TYLENOL) 500 MG tablet, Take 1,000 mg by mouth at bedtime. , Disp: , Rfl:  .  Cholecalciferol (VITAMIN D) 2000 UNITS tablet, Take 1 tablet by mouth daily. Bedtime, Disp: , Rfl:  .  esomeprazole (NEXIUM) 40 MG capsule, TAKE ONE CAPSULE BY MOUTH AT BEDTIME, Disp: 30 capsule, Rfl: 2 .  meloxicam (MOBIC) 15 MG tablet, Take 1 tablet (15 mg total) by mouth daily., Disp: 30 tablet, Rfl: 5 .  Multiple Vitamins-Minerals (ICAPS AREDS 2) CAPS, Take 2 capsules by mouth daily. , Disp: , Rfl:  .  PROAIR HFA 108 (90 Base) MCG/ACT inhaler, INHALE TWO PUFFS EVERY 6 HOURS AS NEEDEDWHEEZING, Disp: 8.5 g, Rfl: 1 .  Probiotic Product (SOLUBLE FIBER/PROBIOTICS PO), Take 1 capsule by mouth daily., Disp: , Rfl:  .  vitamin B-12 (CYANOCOBALAMIN) 1000 MCG tablet, Take 1,000 mcg by mouth daily. Bedtime, Disp: , Rfl:  .  BREO ELLIPTA 100-25 MCG/INH AEPB, Inhale 1 puff into the lungs daily., Disp: 1 each, Rfl: 5  Allergies  Allergen Reactions  . Morphine   . Penicillins   . Sulfa Antibiotics      ROS  Constitutional: Negative for fever or weight change.  Respiratory: Negative for cough and shortness of breath.   Cardiovascular: Negative for chest pain or palpitations.  Gastrointestinal: Negative for abdominal pain, no bowel changes.  Musculoskeletal: Negative for gait problem or joint swelling.  Skin: Negative for rash.  Neurological: Negative for dizziness or headache.  No other specific complaints in a complete review of systems (except as listed in HPI  above).  Objective  Vitals:   09/03/17 1333  BP: (!) 150/90  Pulse: 80  Resp: 14  SpO2: 97%  Weight: 286 lb 1.6 oz (129.8 kg)  Height: 5\' 3"  (1.6 m)    Body mass index is 50.68 kg/m.  Physical Exam  Constitutional: Patient appears well-developed and well-nourished. Obese No distress.  HEENT: head atraumatic, normocephalic, pupils equal and reactive to light,  neck supple, throat within normal limits Cardiovascular: Normal rate, regular rhythm and normal heart sounds.  No murmur heard. non pitting  BLE edema. Pulmonary/Chest: Effort normal and breath sounds normal. No respiratory distress. Abdominal: Soft.  There is no tenderness. Psychiatric: Patient has a normal mood and affect. behavior is normal. Judgment and thought content normal.  PHQ2/9: Depression screen [facility] 2/9 09/03/2017 03/02/2017 07/29/2016 01/27/2016 07/29/2015  Decreased  Interest 0 0 0 0 0  Down, Depressed, Hopeless 0 0 0 0 1  PHQ - 2 Score 0 0 0 0 1  Altered sleeping 0 - - - -  Tired, decreased energy 2 - - - -  Change in appetite 0 - - - -  Feeling bad or failure about yourself  0 - - - -  Trouble concentrating 0 - - - -  Moving slowly or fidgety/restless 0 - - - -  PHQ-9 Score 2 - - - -  Difficult doing work/chores Not difficult at all - - - -     Fall Risk: Fall Risk  09/03/2017 03/02/2017 07/29/2016 01/27/2016 07/29/2015  Falls in the past year? No No No Yes No  Number falls in past yr: - - - 1 -  Injury with Fall? - - - No -     Functional Status Survey: Does the patient have difficulty seeing, even when wearing glasses/contacts?: Yes Does the patient have difficulty concentrating, remembering, or making decisions?: No Does the patient have difficulty walking or climbing stairs?: Yes Does the patient have difficulty dressing or bathing?: No Does the patient have difficulty doing errands alone such as visiting a doctor's office or shopping?: No   Assessment & Plan  1. Depression, major,  recurrent, mild (HCC)  She weaned self off medication, did not like Duloxetine but does not want to go back on Prozac She states sewing is a good way to cope with stress.   2. Moderate persistent asthma without complication  - BREO ELLIPTA 100-25 MCG/INH AEPB; Inhale 1 puff into the lungs daily.  Dispense: 1 each; Refill: 5  3. Peripheral polyneuropathy  She states feet are better, no longer groceries shopping - [place] up  4. Encounter for immunization  - Flu vaccine HIGH DOSE PF  5. Gastro-esophageal reflux disease without esophagitis  Stable on medication  6. Senile purpura (HCC)  Stable, and patient was given reassurance again   7. Dysmetabolic syndrome  Discussed life style modification  - HgbA1C  8. Vitamin D deficiency  Continue supplementation  9. Primary osteoarthritis involving multiple joints  She has been unable to wean self off Meloxicam, she states pain was all over without medicaiton   10. Other fatigue  - TSH - COMPLETE METABOLIC PANEL WITH GFR - CBC with Differential/Platelet  11. Hyperglycemia  - Hemoglobin A1c  12. Long-term use of high-risk medication  - COMPLETE METABOLIC PANEL WITH GFR - CBC with Differential/Platelet  13. Elevated blood pressure reading  - EKG 12-Lead

## 2017-09-04 LAB — CBC WITH DIFFERENTIAL/PLATELET
Basophils Absolute: 58 cells/uL (ref 0–200)
Basophils Relative: 0.6 %
EOS PCT: 2.1 %
Eosinophils Absolute: 204 cells/uL (ref 15–500)
HEMATOCRIT: 39.6 % (ref 35.0–45.0)
HEMOGLOBIN: 13.5 g/dL (ref 11.7–15.5)
LYMPHS ABS: 2842 {cells}/uL (ref 850–3900)
MCH: 29.3 pg (ref 27.0–33.0)
MCHC: 34.1 g/dL (ref 32.0–36.0)
MCV: 86.1 fL (ref 80.0–100.0)
MPV: 11.2 fL (ref 7.5–12.5)
Monocytes Relative: 6 %
NEUTROS ABS: 6014 {cells}/uL (ref 1500–7800)
Neutrophils Relative %: 62 %
Platelets: 270 10*3/uL (ref 140–400)
RBC: 4.6 10*6/uL (ref 3.80–5.10)
RDW: 13.6 % (ref 11.0–15.0)
Total Lymphocyte: 29.3 %
WBC mixed population: 582 cells/uL (ref 200–950)
WBC: 9.7 10*3/uL (ref 3.8–10.8)

## 2017-09-04 LAB — COMPLETE METABOLIC PANEL WITH GFR
AG Ratio: 1.5 (calc) (ref 1.0–2.5)
ALKALINE PHOSPHATASE (APISO): 93 U/L (ref 33–130)
ALT: 11 U/L (ref 6–29)
AST: 16 U/L (ref 10–35)
Albumin: 4.3 g/dL (ref 3.6–5.1)
BILIRUBIN TOTAL: 0.5 mg/dL (ref 0.2–1.2)
BUN: 15 mg/dL (ref 7–25)
CHLORIDE: 101 mmol/L (ref 98–110)
CO2: 28 mmol/L (ref 20–32)
Calcium: 9.5 mg/dL (ref 8.6–10.4)
Creat: 0.69 mg/dL (ref 0.60–0.93)
GFR, Est African American: 102 mL/min/{1.73_m2} (ref >=60)
GFR, Est Non African American: 88 mL/min/{1.73_m2} (ref >=60)
GLUCOSE: 92 mg/dL (ref 65–139)
Globulin: 2.8 g/dL (calc) (ref 1.9–3.7)
Potassium: 4.6 mmol/L (ref 3.5–5.3)
Sodium: 139 mmol/L (ref 135–146)
TOTAL PROTEIN: 7.1 g/dL (ref 6.1–8.1)

## 2017-09-04 LAB — HEMOGLOBIN A1C
HEMOGLOBIN A1C: 5.4 %{Hb} (ref <5.7)
Mean Plasma Glucose: 108 (calc)
eAG (mmol/L): 6 (calc)

## 2017-09-04 LAB — TSH: TSH: 1.2 m[IU]/L (ref 0.40–4.50)

## 2017-11-03 ENCOUNTER — Other Ambulatory Visit: Payer: Self-pay | Admitting: Family Medicine

## 2017-11-03 NOTE — Telephone Encounter (Signed)
Refill request for general medication: Tessalon Pearls   Last office visit: 09/03/2017  Last physical exam: None indicated  Follow up visit: 03/14/2018

## 2017-11-04 NOTE — Telephone Encounter (Signed)
She needs to be seen if cough not better with otc medication, she can try mucinex DM, otherwise come in to see Irving Burtonmily this week.

## 2017-12-02 ENCOUNTER — Other Ambulatory Visit: Payer: Self-pay | Admitting: Family Medicine

## 2017-12-02 DIAGNOSIS — K219 Gastro-esophageal reflux disease without esophagitis: Secondary | ICD-10-CM

## 2017-12-02 NOTE — Telephone Encounter (Signed)
Refill request for general medication. Nexium to [facility]  Last office visit: 09/03/2017  Follow up 03/14/2018.

## 2017-12-31 ENCOUNTER — Ambulatory Visit: Payer: Self-pay | Admitting: Podiatry

## 2018-01-25 ENCOUNTER — Other Ambulatory Visit: Payer: Self-pay | Admitting: Family Medicine

## 2018-01-25 DIAGNOSIS — M150 Primary generalized (osteo)arthritis: Principal | ICD-10-CM

## 2018-01-25 DIAGNOSIS — M159 Polyosteoarthritis, unspecified: Secondary | ICD-10-CM

## 2018-02-24 ENCOUNTER — Other Ambulatory Visit: Payer: Self-pay | Admitting: Family Medicine

## 2018-02-24 DIAGNOSIS — K219 Gastro-esophageal reflux disease without esophagitis: Secondary | ICD-10-CM

## 2018-02-24 NOTE — Telephone Encounter (Signed)
Refill request for general medication: Nexium 40 mg  Last office visit: 09/03/2017  Last physical exam: None indicated  Follow-ups on file.  03/14/2018

## 2018-02-25 MED ORDER — ESOMEPRAZOLE MAGNESIUM 20 MG PO CPDR: 20.0000 mg | DELAYED_RELEASE_CAPSULE | Freq: Every day | ORAL | 0 refills | Status: DC

## 2018-02-25 NOTE — Telephone Encounter (Signed)
Please see if patient would be willing to reduce her nexium from 40 mg to 20 mg You can tell her: prolonged use of proton pump inhibitors like omeprazole (Prilosec), pantoprazole (Protonix), esomeprazole (Nexium), and others like Dexilant and Aciphex may increase your risk of pneumonia, Clostridium difficile colitis, osteoporosis, anemia and other health complications Try to limit or avoid triggers like coffee, caffeinated beverages, onions, chocolate, spicy foods, peppermint, acidic foods like pizza, spaghetti sauce, and orange juice Lose weight if you are overweight or obese Try elevating the head of your bed by placing a small wedge between your mattress and box springs to keep acid in the stomach at night instead of coming up into your esophagus  I'll send the 20 mg and she can contact us if that's not controlling her symptoms; we always want to use the lowest dose that works

## 2018-02-28 NOTE — Telephone Encounter (Signed)
Dx. Not found please add dx and sign.

## 2018-02-28 NOTE — Telephone Encounter (Signed)
She has tried to use a reduce dose of all proton pump inhibitors, but they don't control her symptoms. She would like to continue her usual dose. She appreciates the information.

## 2018-02-28 NOTE — Telephone Encounter (Signed)
Then I'll recommend referred to GI Please enter referral, dx: recalcitrant acid reflux despite PPI Thank you

## 2018-02-28 NOTE — Assessment & Plan Note (Signed)
Refer to GI 

## 2018-02-28 NOTE — Telephone Encounter (Signed)
Referral signed, thank you I'm also going to recommend that patient STOP her meloxicam and talk with her primary about other options for arthritis pain Tylenol is safest for most patients, as long as they follow package directions and do not have liver disease

## 2018-03-01 NOTE — Telephone Encounter (Signed)
Called patient and did not get an answer. If patient calls back please inform her of the following per Dr. Sherie DonLada:  Referral signed, thank you I'm also going to recommend that patient STOP her meloxicam and talk with her primary about other options for arthritis pain Tylenol is safest for most patients, as long as they follow package directions and do not have liver disease

## 2018-03-14 ENCOUNTER — Encounter: Payer: Self-pay | Admitting: Family Medicine

## 2018-03-14 ENCOUNTER — Ambulatory Visit: Payer: Medicare Other | Admitting: Family Medicine

## 2018-03-14 VITALS — BP 124/76 | HR 89 | Temp 99.1°F | Resp 18 | Ht 63.0 in | Wt 290.7 lb

## 2018-03-14 DIAGNOSIS — M150 Primary generalized (osteo)arthritis: Secondary | ICD-10-CM

## 2018-03-14 DIAGNOSIS — D692 Other nonthrombocytopenic purpura: Secondary | ICD-10-CM

## 2018-03-14 DIAGNOSIS — E559 Vitamin D deficiency, unspecified: Secondary | ICD-10-CM | POA: Diagnosis not present

## 2018-03-14 DIAGNOSIS — E8881 Metabolic syndrome: Secondary | ICD-10-CM

## 2018-03-14 DIAGNOSIS — R739 Hyperglycemia, unspecified: Secondary | ICD-10-CM

## 2018-03-14 DIAGNOSIS — H3530 Unspecified macular degeneration: Secondary | ICD-10-CM | POA: Diagnosis not present

## 2018-03-14 DIAGNOSIS — K219 Gastro-esophageal reflux disease without esophagitis: Secondary | ICD-10-CM

## 2018-03-14 DIAGNOSIS — Z1211 Encounter for screening for malignant neoplasm of colon: Secondary | ICD-10-CM

## 2018-03-14 DIAGNOSIS — J4540 Moderate persistent asthma, uncomplicated: Secondary | ICD-10-CM

## 2018-03-14 DIAGNOSIS — F330 Major depressive disorder, recurrent, mild: Secondary | ICD-10-CM

## 2018-03-14 DIAGNOSIS — G629 Polyneuropathy, unspecified: Secondary | ICD-10-CM | POA: Diagnosis not present

## 2018-03-14 DIAGNOSIS — M159 Polyosteoarthritis, unspecified: Secondary | ICD-10-CM

## 2018-03-14 DIAGNOSIS — Z1231 Encounter for screening mammogram for malignant neoplasm of breast: Secondary | ICD-10-CM

## 2018-03-14 MED ORDER — ESOMEPRAZOLE MAGNESIUM 20 MG PO CPDR: 20.0000 mg | DELAYED_RELEASE_CAPSULE | Freq: Every day | ORAL | 1 refills | Status: DC

## 2018-03-14 MED ORDER — MELOXICAM 15 MG PO TABS: 15.0000 mg | ORAL_TABLET | Freq: Every day | ORAL | 1 refills | Status: DC

## 2018-03-14 MED ORDER — BREO ELLIPTA 100-25 MCG/INH IN AEPB: 1.0000 | INHALATION_SPRAY | Freq: Every day | RESPIRATORY_TRACT | 5 refills | Status: DC

## 2018-03-14 MED ORDER — ALBUTEROL SULFATE HFA 108 (90 BASE) MCG/ACT IN AERS: INHALATION_SPRAY | RESPIRATORY_TRACT | 1 refills | Status: DC

## 2018-03-14 NOTE — Progress Notes (Signed)
Name: Christie Hanson   MRN: [id number]    DOB: 1946-08-31   Date:03/14/2018       Progress Note  Subjective  Chief Complaint  Chief Complaint  Patient presents with  . Follow-up    6 month recheck  . Gastroesophageal Reflux    HPI   Metabolic Syndrome: last hgbA1C was 5.4%, she denies polyphagia, polydipsia or polyuria. Not currently checking glucose at home, previous lipid panel was normal.   Asthma Moderate: not using rescue inhaler. She is compliant with Breo. She takes loratadine prn only. Only has occasional dry cough, about once a week, wheezing at night when she skips medication, otherwise doing well. SOB present with moderate activity.   GERD: taking Nexium daily, on 20 mg for the past 2 weeks and still no symptoms, denies heartburn.   Neuropathy: she states she has burning sensation on both feet for many years, last B12 at goal, she states that she has been avoiding standing or walking long term and is doing better, could not tolerate Cymbalta and stopped on her own. She does not want to try Gabapentin or Lyrica at this time  Depression: she stopped taking all medication back in 2017.Took Prozac and Abilify for many years. She stopped because of side effects, like tremors - resolved now. Tried cymbalta but it made her feel "weird" and stopped medication, it made her feel anxious.  She is more stressed with husband because he has some memory loss and not taking care of himself, forgets the insulin shots and medication and frustrates her.   Obesity: weight is stable, she has some joint problems, she is going to the pool at the [facility] for water aerobics once a week, discussed losing weight will help with SOB and knee pain   Vitamin D and Vitamin B12 deficiency: back to normal still on supplementation.  OA: she tried weaning self off Meloxicam and pain got much worse, no pain while at rest but pain is intense when standing or walking about one hour, no effusion, she has  daily pain, takes Tylenol prn  Macular degeneration: stable, blurred vision but still able to sew in good lighting.   Patient Active Problem List   Diagnosis Date Noted  . Senile purpura (HCC) 03/14/2018  . Age-related macular degeneration 03/14/2018  . Morbid obesity (HCC) 03/14/2018  . Peripheral polyneuropathy 03/02/2017  . Vitamin B12 deficiency 07/29/2015  . Carpal tunnel syndrome 07/27/2015  . Osteoarthritis 07/27/2015  . Depression, major, recurrent, mild (HCC) 07/27/2015  . Gastro-esophageal reflux disease without esophagitis 07/27/2015  . Displacement of cervical intervertebral disc without myelopathy 07/27/2015  . Blood glucose elevated 07/27/2015  . Dysmetabolic syndrome 07/27/2015  . Vitamin D deficiency 07/27/2015  . Sprain of rotator cuff capsule 07/27/2015  . Urgency of urination 07/27/2015  . Allergic rhinitis 07/27/2015  . Asthma, moderate persistent 07/27/2015    Past Surgical History:  Procedure Laterality Date  . ABDOMINAL HYSTERECTOMY  1991  . BILATERAL SALPINGOOPHORECTOMY    . CARPAL TUNNEL RELEASE Bilateral [id number]   [facility]  . CATARACT EXTRACTION W/PHACO Left 09/23/2015   Procedure: CATARACT EXTRACTION PHACO AND INTRAOCULAR LENS PLACEMENT (IOC);  Surgeon: Lockie Mola, MD;  Location: [facility] ORS;  Service: Ophthalmology;  Laterality: Left;  [place] 00:57 AP% 12.2 6.98 CDE Fluid pack lot # [id number]  . CATARACT EXTRACTION W/PHACO Right 10/17/2015   Procedure: CATARACT EXTRACTION PHACO AND INTRAOCULAR LENS PLACEMENT (IOC);  Surgeon: Lockie Mola, MD;  Location: [facility] ORS;  Service: Ophthalmology;  Laterality: Right;  [place]  00:30.5 AP   00:47.2 CDE  6.05 casette lot #[id number]  . DandC    . DILATION AND CURETTAGE OF UTERUS  1990  . HERNIA REPAIR  1992   ventral    Family History  Problem Relation Age of Onset  . Hypertension Mother   . Cancer Father        prostate  . CVA Father   . Depression Father   . Depression Brother   . Cancer Brother         prostate  . Depression Brother     Social History   Socioeconomic History  . Marital status: Married    Spouse name: Not on file  . Number of children: Not on file  . Years of education: Not on file  . Highest education level: Not on file  Occupational History  . Not on file  Social Needs  . Financial resource strain: Not on file  . Food insecurity:    Worry: Not on file    Inability: Not on file  . Transportation needs:    Medical: Not on file    Non-medical: Not on file  Tobacco Use  . Smoking status: Never Smoker  . Smokeless tobacco: Never Used  Substance and Sexual Activity  . Alcohol use: No    Alcohol/week: 0.0 oz  . Drug use: No  . Sexual activity: Yes    Partners: Male  Lifestyle  . Physical activity:    Days per week: Not on file    Minutes per session: Not on file  . Stress: Not on file  Relationships  . Social connections:    Talks on phone: Not on file    Gets together: Not on file    Attends religious service: Not on file    Active member of club or organization: Not on file    Attends meetings of clubs or organizations: Not on file    Relationship status: Not on file  . Intimate partner violence:    Fear of current or ex partner: Not on file    Emotionally abused: Not on file    Physically abused: Not on file    Forced sexual activity: Not on file  Other Topics Concern  . Not on file  Social History Narrative  . Not on file     Current Outpatient Medications:  .  acetaminophen (TYLENOL) 500 MG tablet, Take 1,000 mg by mouth at bedtime. , Disp: , Rfl:  .  BREO ELLIPTA 100-25 MCG/INH AEPB, Inhale 1 puff into the lungs daily., Disp: 1 each, Rfl: 5 .  Cholecalciferol (VITAMIN D) 2000 UNITS tablet, Take 1 tablet by mouth daily. Bedtime, Disp: , Rfl:  .  esomeprazole (NEXIUM) 20 MG capsule, Take 1 capsule (20 mg total) by mouth daily., Disp: 30 capsule, Rfl: 0 .  meloxicam (MOBIC) 15 MG tablet, TAKE ONE (1) TABLET EACH DAY, Disp: 30 tablet,  Rfl: 1 .  Multiple Vitamins-Minerals (ICAPS AREDS 2) CAPS, Take 2 capsules by mouth daily. , Disp: , Rfl:  .  PROAIR HFA 108 (90 Base) MCG/ACT inhaler, INHALE TWO PUFFS EVERY 6 HOURS AS NEEDEDWHEEZING, Disp: 8.5 g, Rfl: 1 .  Probiotic Product (SOLUBLE FIBER/PROBIOTICS PO), Take 1 capsule by mouth daily., Disp: , Rfl:  .  vitamin B-12 (CYANOCOBALAMIN) 1000 MCG tablet, Take 1,000 mcg by mouth daily. Bedtime, Disp: , Rfl:   Allergies  Allergen Reactions  . Morphine   . Penicillins   . Sulfa Antibiotics  ROS  Constitutional: Negative for fever or weight change.  Respiratory:Postiive for occasional  cough and occasional shortness of breath.   Cardiovascular: Negative for chest pain or palpitations.  Gastrointestinal: Negative for abdominal pain, no bowel changes.  Musculoskeletal: Positive for gait problem or joint swelling.  Skin: Negative for rash.  Neurological: Negative for dizziness or headache.  No other specific complaints in a complete review of systems (except as listed in HPI above).  Objective  Vitals:   03/14/18 1322  BP: 124/76  Pulse: 89  Resp: 18  Temp: 99.1 F (37.3 C)  TempSrc: Oral  SpO2: 94%  Weight: 290 lb 11.2 oz (131.9 kg)  Height:  (1.6 m)    Body mass index is 51.5 kg/m.  Physical Exam  Constitutional: Patient appears well-developed and well-nourished. Obese  No distress.  HEENT: head atraumatic, normocephalic, pupils equal and reactive to light, neck supple, throat within normal limits Cardiovascular: Normal rate, regular rhythm and normal heart sounds.  No murmur heard. positive for non pitting  BLE edema. Pulmonary/Chest: Effort normal and breath sounds normal. No respiratory distress. Abdominal: Soft.  There is no tenderness. Psychiatric: Patient has a normal mood and affect. behavior is normal. Judgment and thought content normal. Muscular skeletal: crepitus with extension of both knees, uses cane when on un-even surface Skin:  senile purpura on both arms   PHQ2/9: Depression screen [facility] 2/9 03/14/2018 09/03/2017 03/02/2017 07/29/2016 01/27/2016  Decreased Interest 1 0 0 0 0  Down, Depressed, Hopeless 0 0 0 0 0  PHQ - 2 Score 1 0 0 0 0  Altered sleeping 0 0 - - -  Tired, decreased energy 2 2 - - -  Change in appetite 0 0 - - -  Feeling bad or failure about yourself  0 0 - - -  Trouble concentrating 0 0 - - -  Moving slowly or fidgety/restless 0 0 - - -  Suicidal thoughts 0 - - - -  PHQ-9 Score 3 2 - - -  Difficult doing work/chores Somewhat difficult Not difficult at all - - -     Fall Risk: Fall Risk  09/03/2017 03/02/2017 07/29/2016 01/27/2016 07/29/2015  Falls in the past year? No No No Yes No  Number falls in past yr: - - - 1 -  Injury with Fall? - - - No -     Functional Status Survey: Is the patient deaf or have difficulty hearing?: No Does the patient have difficulty seeing, even when wearing glasses/contacts?: No Does the patient have difficulty concentrating, remembering, or making decisions?: No Does the patient have difficulty walking or climbing stairs?: Yes Does the patient have difficulty dressing or bathing?: No Does the patient have difficulty doing errands alone such as visiting a doctor's office or shopping?: No    Assessment & Plan  1. Depression, major, recurrent, mild (HCC)  She refuses medication at this time  2. Asthma, moderate persistent, well-controlled  - BREO ELLIPTA 100-25 MCG/INH AEPB; Inhale 1 puff into the lungs daily.  Dispense: 1 each; Refill: 5 - albuterol (PROAIR HFA) 108 (90 Base) MCG/ACT inhaler; INHALE TWO PUFFS EVERY 6 HOURS AS NEEDEDWHEEZING  Dispense: 8.5 g; Refill: 1  3. Primary osteoarthritis involving multiple joints  - meloxicam (MOBIC) 15 MG tablet; Take 1 tablet (15 mg total) by mouth daily.  Dispense: 90 tablet; Refill: 1  4. Senile purpura (HCC)  stable  5. Morbid obesity (HCC)  Discussed with the patient the risk posed by an increased BMI.  Discussed importance of portion control, calorie counting and at least 150 minutes of physical activity weekly. Avoid sweet beverages and drink more water. Eat at least 6 servings of fruit and vegetables daily   6. Dysmetabolic syndrome  Last hgbA1C was normal   7. Vitamin D deficiency  Continue supplementation   8. Peripheral polyneuropathy  Both feet, chronic and stable, does not want medication  9. Gastro-esophageal reflux disease without esophagitis  On lower dose and doing well, but will call if she wants to change to higher dose - esomeprazole (NEXIUM) 20 MG capsule; Take 1 capsule (20 mg total) by mouth daily.  Dispense: 90 capsule; Refill: 1  10. Hyperglycemia  Reviewed labs  11. Age-related macular degeneration  Seeing eye doctor  12. Colon cancer screening  -Cologuard  13. Breast cancer screening by mammogram  Mammogram

## 2018-03-15 ENCOUNTER — Telehealth: Payer: Self-pay

## 2018-03-15 NOTE — Telephone Encounter (Signed)
Called pt to sched AWV w/ [facility]. LVM requesting returned call. Also called cell number. Unable to LVM d/t has not set up VM.

## 2018-04-20 ENCOUNTER — Other Ambulatory Visit: Payer: Self-pay | Admitting: Family Medicine

## 2018-04-20 DIAGNOSIS — J4540 Moderate persistent asthma, uncomplicated: Secondary | ICD-10-CM

## 2018-04-20 NOTE — Telephone Encounter (Signed)
Refill request for general medication. Breo  Last office visit: 03/14/18   Follow up on 08/23/2018

## 2018-04-27 ENCOUNTER — Other Ambulatory Visit: Payer: Self-pay | Admitting: Orthopedic Surgery

## 2018-04-27 ENCOUNTER — Ambulatory Visit
Admission: RE | Admit: 2018-04-27 | Discharge: 2018-04-27 | Disposition: A | Discharge status: Home / Self Care | Payer: Medicare Other | Source: Ambulatory Visit | Attending: Orthopedic Surgery | Admitting: Orthopedic Surgery

## 2018-04-27 DIAGNOSIS — R52 Pain, unspecified: Secondary | ICD-10-CM

## 2018-04-27 DIAGNOSIS — M170 Bilateral primary osteoarthritis of knee: Secondary | ICD-10-CM

## 2018-05-03 ENCOUNTER — Encounter: Payer: Self-pay | Admitting: Family Medicine

## 2018-05-03 LAB — COLOGUARD: COLOGUARD: NEGATIVE

## 2018-05-09 ENCOUNTER — Other Ambulatory Visit: Payer: Self-pay | Admitting: Family Medicine

## 2018-05-09 ENCOUNTER — Encounter: Payer: Self-pay | Admitting: Family Medicine

## 2018-05-09 DIAGNOSIS — K219 Gastro-esophageal reflux disease without esophagitis: Secondary | ICD-10-CM

## 2018-05-09 MED ORDER — ESOMEPRAZOLE MAGNESIUM 40 MG PO CPDR: 40.0000 mg | DELAYED_RELEASE_CAPSULE | Freq: Every day | ORAL | 1 refills | Status: DC

## 2018-08-23 ENCOUNTER — Ambulatory Visit (INDEPENDENT_AMBULATORY_CARE_PROVIDER_SITE_OTHER): Payer: Medicare Other | Admitting: Family Medicine

## 2018-08-23 ENCOUNTER — Ambulatory Visit (INDEPENDENT_AMBULATORY_CARE_PROVIDER_SITE_OTHER): Payer: Medicare Other

## 2018-08-23 ENCOUNTER — Encounter: Payer: Self-pay | Admitting: Family Medicine

## 2018-08-23 VITALS — BP 124/76 | HR 89 | Temp 99.1°F | Resp 18 | Ht 63.0 in | Wt 290.7 lb

## 2018-08-23 VITALS — BP 126/72 | HR 68 | Temp 97.8°F | Resp 14 | Ht 63.0 in | Wt 293.4 lb

## 2018-08-23 DIAGNOSIS — G629 Polyneuropathy, unspecified: Secondary | ICD-10-CM

## 2018-08-23 DIAGNOSIS — J4540 Moderate persistent asthma, uncomplicated: Secondary | ICD-10-CM | POA: Diagnosis not present

## 2018-08-23 DIAGNOSIS — Z0000 Encounter for general adult medical examination without abnormal findings: Secondary | ICD-10-CM | POA: Diagnosis not present

## 2018-08-23 DIAGNOSIS — Z23 Encounter for immunization: Secondary | ICD-10-CM

## 2018-08-23 DIAGNOSIS — Z79899 Other long term (current) drug therapy: Secondary | ICD-10-CM

## 2018-08-23 DIAGNOSIS — G5711 Meralgia paresthetica, right lower limb: Secondary | ICD-10-CM

## 2018-08-23 DIAGNOSIS — R739 Hyperglycemia, unspecified: Secondary | ICD-10-CM

## 2018-08-23 DIAGNOSIS — F331 Major depressive disorder, recurrent, moderate: Secondary | ICD-10-CM

## 2018-08-23 DIAGNOSIS — M150 Primary generalized (osteo)arthritis: Secondary | ICD-10-CM

## 2018-08-23 DIAGNOSIS — E559 Vitamin D deficiency, unspecified: Secondary | ICD-10-CM

## 2018-08-23 DIAGNOSIS — D692 Other nonthrombocytopenic purpura: Secondary | ICD-10-CM

## 2018-08-23 DIAGNOSIS — K219 Gastro-esophageal reflux disease without esophagitis: Secondary | ICD-10-CM

## 2018-08-23 DIAGNOSIS — M159 Polyosteoarthritis, unspecified: Secondary | ICD-10-CM

## 2018-08-23 DIAGNOSIS — E538 Deficiency of other specified B group vitamins: Secondary | ICD-10-CM

## 2018-08-23 MED ORDER — MELOXICAM 15 MG PO TABS: 15.0000 mg | ORAL_TABLET | Freq: Every day | ORAL | 1 refills | Status: DC

## 2018-08-23 MED ORDER — DULOXETINE HCL 60 MG PO CPEP: 60.0000 mg | ORAL_CAPSULE | Freq: Every day | ORAL | 1 refills | Status: DC

## 2018-08-23 MED ORDER — FLUTICASONE FUROATE-VILANTEROL 100-25 MCG/INH IN AEPB: 1.0000 | INHALATION_SPRAY | Freq: Every day | RESPIRATORY_TRACT | 5 refills | Status: DC

## 2018-08-23 MED ORDER — ESOMEPRAZOLE MAGNESIUM 40 MG PO CPDR: 40.0000 mg | DELAYED_RELEASE_CAPSULE | Freq: Every day | ORAL | 1 refills | Status: DC

## 2018-08-23 MED ORDER — BENZONATATE 100 MG PO CAPS: 100.0000 mg | ORAL_CAPSULE | Freq: Three times a day (TID) | ORAL | 0 refills | Status: DC | PRN

## 2018-08-23 NOTE — Progress Notes (Signed)
Name: Christie Hanson   MRN: [id number]    DOB: 1946/03/15   Date:08/24/2018       Progress Note  Subjective  Chief Complaint  Chief Complaint  Patient presents with  . Follow-up    HPI  Metabolic Syndrome:last hgbA1C was 5.4%, she denies polyphagia, polydipsia or polyuria. Not currently checking glucose at home, previous lipid panel was normal and we will not recheck lipid panel   Asthma Moderate: not using rescue inhaler. She is compliant with Breo.She takes loratadine prn only. Only has occasional dry cough, she states tessalon pereles helps when she has an URI.   GERD: taking Nexium daily,she has been unable to wean self off. No heart burn as long as she takes medications  Neuropathy: she states she has burning sensation on both feet for many years, last B12 was normal but has history of b12 deficiency. She does not want to try Gabapentin or Lyrica at this time. She had tried duloxetine in the past and stopped, however currently taking 60 mg daily and states it has decreased pain.   Meralgia paresthetica: it was present for many years, however this Summer the pain was daily and very intense, burning like , right lateral thigh, not associated with weakness, she started to get very depressed and decided to go on duloxetine that she had at home and states pain is finally a zero. It was 7/10 prior to medication   Depression: she stopped taking all medication back in 2017.Took Prozac and Abilify for many years prior to that . She stopped because of side effects, like tremors Tried cymbalta but it made her feel "weird" and stopped medication, it made her feel anxious, however decided to go back on medication in 07/2018 because of meralgia paresthetica and worsenign of depression and has noticed takes 20-30 to fall asleep at night and also mild intermittent dizziness occasionally. She wants to continue medication since pain has resolved and seems to be helping with her mood.    Obesity: weight is stable, she has some joint problems, she is a member of [employer] in [place] , she will try going back to water aerobics.   Vitamin D and Vitamin B12 deficiency: back to normal still on supplementation.  OA: she tried weaning self offMeloxicamand pain got much worse, she had worsening of pain this Summer, saw Dr. Thurston Hole and had steroid injection and is doing well now.    Patient Active Problem List   Diagnosis Date Noted  . Senile purpura (HCC) 03/14/2018  . Age-related macular degeneration 03/14/2018  . Morbid obesity (HCC) 03/14/2018  . Peripheral polyneuropathy 03/02/2017  . Vitamin B12 deficiency 07/29/2015  . Carpal tunnel syndrome 07/27/2015  . Osteoarthritis 07/27/2015  . Depression, major, recurrent, mild (HCC) 07/27/2015  . Gastro-esophageal reflux disease without esophagitis 07/27/2015  . Displacement of cervical intervertebral disc without myelopathy 07/27/2015  . Blood glucose elevated 07/27/2015  . Dysmetabolic syndrome 07/27/2015  . Vitamin D deficiency 07/27/2015  . Sprain of rotator cuff capsule 07/27/2015  . Urgency of urination 07/27/2015  . Allergic rhinitis 07/27/2015  . Asthma, moderate persistent 07/27/2015    Past Surgical History:  Procedure Laterality Date  . ABDOMINAL HYSTERECTOMY  1991  . BILATERAL SALPINGOOPHORECTOMY    . CARPAL TUNNEL RELEASE Bilateral [id number]   [facility]  . CATARACT EXTRACTION W/PHACO Left 09/23/2015   Procedure: CATARACT EXTRACTION PHACO AND INTRAOCULAR LENS PLACEMENT (IOC);  Surgeon: Lockie Mola, MD;  Location: [facility] ORS;  Service: Ophthalmology;  Laterality: Left;  [place] 00:57  AP% 12.2 6.98 CDE Fluid pack lot # [id number]  . CATARACT EXTRACTION W/PHACO Right 10/17/2015   Procedure: CATARACT EXTRACTION PHACO AND INTRAOCULAR LENS PLACEMENT (IOC);  Surgeon: Lockie Mola, MD;  Location: [facility] ORS;  Service: Ophthalmology;  Laterality: Right;  [place]  00:30.5 AP   00:47.2 CDE  6.05 casette lot  #[id number]  . DandC    . DILATION AND CURETTAGE OF UTERUS  1990  . HERNIA REPAIR  1992   ventral    Family History  Problem Relation Age of Onset  . Hypertension Mother   . Cancer Father        prostate  . CVA Father   . Depression Father   . Healthy Sister   . Cancer Brother        prostate    Social History   Socioeconomic History  . Marital status: Married    Spouse name: Maisie Fus  . Number of children: 4  . Years of education: Not on file  . Highest education level: Associate degree: occupational, Scientist, product/process development, or vocational program  Occupational History  . Occupation: Retired  Engineer, production  . Financial resource strain: Not hard at all  . Food insecurity:    Worry: Never true    Inability: Never true  . Transportation needs:    Medical: No    Non-medical: No  Tobacco Use  . Smoking status: Never Smoker  . Smokeless tobacco: Never Used  . Tobacco comment: smoking cessation materials not required  Substance and Sexual Activity  . Alcohol use: No    Alcohol/week: 0.0 standard drinks  . Drug use: No  . Sexual activity: Yes    Partners: Male  Lifestyle  . Physical activity:    Days per week: 0 days    Minutes per session: 0 min  . Stress: Not at all  Relationships  . Social connections:    Talks on phone: Patient refused    Gets together: Patient refused    Attends religious service: Patient refused    Active member of club or organization: Patient refused    Attends meetings of clubs or organizations: Patient refused    Relationship status: Married  . Intimate partner violence:    Fear of current or ex partner: No    Emotionally abused: No    Physically abused: No    Forced sexual activity: No  Other Topics Concern  . Not on file  Social History Narrative  . Not on file     Current Outpatient Medications:  .  acetaminophen (TYLENOL) 500 MG tablet, Take 1,000 mg by mouth at bedtime. , Disp: , Rfl:  .  albuterol (PROAIR HFA) 108 (90 Base) MCG/ACT  inhaler, INHALE TWO PUFFS EVERY 6 HOURS AS NEEDEDWHEEZING, Disp: 8.5 g, Rfl: 1 .  benzonatate (TESSALON) 100 MG capsule, Take 1-2 capsules (100-200 mg total) by mouth 3 (three) times daily as needed., Disp: 40 capsule, Rfl: 0 .  Cholecalciferol (VITAMIN D) 2000 UNITS tablet, Take 1 tablet by mouth daily. Bedtime, Disp: , Rfl:  .  DULoxetine (CYMBALTA) 60 MG capsule, Take 1 capsule (60 mg total) by mouth daily., Disp: 90 capsule, Rfl: 1 .  esomeprazole (NEXIUM) 40 MG capsule, Take 1 capsule (40 mg total) by mouth daily., Disp: 90 capsule, Rfl: 1 .  fluticasone furoate-vilanterol (BREO ELLIPTA) 100-25 MCG/INH AEPB, Inhale 1 puff into the lungs daily., Disp: 60 each, Rfl: 5 .  MAGNESIUM MALATE PO, Take 1 tablet by mouth daily., Disp: , Rfl:  .  meloxicam (  MOBIC) 15 MG tablet, Take 1 tablet (15 mg total) by mouth daily., Disp: 90 tablet, Rfl: 1 .  Multiple Vitamins-Minerals (ICAPS AREDS 2) CAPS, Take 2 capsules by mouth daily. , Disp: , Rfl:  .  Probiotic Product (SOLUBLE FIBER/PROBIOTICS PO), Take 1 capsule by mouth daily., Disp: , Rfl:  .  vitamin B-12 (CYANOCOBALAMIN) 1000 MCG tablet, Take 1,000 mcg by mouth daily. Bedtime, Disp: , Rfl:   Allergies  Allergen Reactions  . Morphine   . Penicillins   . Sulfa Antibiotics     I personally reviewed active problem list, medication list, allergies, family history, social history with the patient/caregiver today.   ROS  Constitutional: Negative for fever positive for intermittent  weight change.  Respiratory: positive  for intermittent cough and shortness of breath.   Cardiovascular: Negative for chest pain or palpitations.  Gastrointestinal: Negative for abdominal pain, no bowel changes.  Musculoskeletal: positive  for gait problem but no  joint swelling.  Skin: Negative for rash.  Neurological: positive  for intermittent dizziness but no  headache.  No other specific complaints in a complete review of systems (except as listed in HPI  above).  Objective   Vitals:   08/24/18 1645  BP: 124/76  Pulse: 89  Resp: 18  Temp: 99.1 F (37.3 C)  TempSrc: Oral  SpO2: 94%  Weight: 290 lb 11.2 oz (131.9 kg)  Height: 5\' 3"  (1.6 m)    Body mass index is 51.5 kg/m.  Physical Exam  Constitutional: Patient appears well-developed and well-nourished. Obese  No distress.  HEENT: head atraumatic, normocephalic, pupils equal and reactive to light, neck supple, throat within normal limits Cardiovascular: Normal rate, regular rhythm and normal heart sounds.  No murmur heard. No BLE edema. Pulmonary/Chest: Effort normal and breath sounds normal. No respiratory distress. Abdominal: Soft.  There is no tenderness. Psychiatric: Patient has a normal mood and affect. behavior is normal. Judgment and thought content normal. Muscular Skeletal: crepitus with extension of both knees. Using cane    PHQ2/9: Depression screen [facility] 2/9 08/23/2018 03/14/2018 09/03/2017 03/02/2017 07/29/2016  Decreased Interest 3 1 0 0 0  Down, Depressed, Hopeless 3 0 0 0 0  PHQ - 2 Score 6 1 0 0 0  Altered sleeping 3 0 0 - -  Tired, decreased energy 3 2 2  - -  Change in appetite 3 0 0 - -  Feeling bad or failure about yourself  2 0 0 - -  Trouble concentrating 0 0 0 - -  Moving slowly or fidgety/restless 0 0 0 - -  Suicidal thoughts 0 0 - - -  PHQ-9 Score 17 3 2  - -  Difficult doing work/chores Somewhat difficult Somewhat difficult Not difficult at all - -     Fall Risk: Fall Risk  08/23/2018 09/03/2017 03/02/2017 07/29/2016 01/27/2016  Falls in the past year? No No No No Yes  Number falls in past yr: - - - - 1  Injury with Fall? - - - - No  Risk for fall due to : Impaired vision;Impaired balance/gait;Medication side effect - - - -  Risk for fall due to: Comment wears eyeglasses; macular degeneration; avoids stairs; knee pain; ambulates with cane - - - -      Assessment & Plan  1. Meralgia paresthetica of right side  Resolved with duloxetine  2.  Morbid obesity (HCC)  Discussed with the patient the risk posed by an increased BMI. Discussed importance of portion control, calorie counting and at least  150 minutes of physical activity weekly. Avoid sweet beverages and drink more water. Eat at least 6 servings of fruit and vegetables daily   3. Depression, major, recurrent, moderate (HCC)  She started a few weeks ago and doing better, still not as well as she was 6 months ago  - DULoxetine (CYMBALTA) 60 MG capsule; Take 1 capsule (60 mg total) by mouth daily.  Dispense: 90 capsule; Refill: 1 - COMPLETE METABOLIC PANEL WITH GFR  4. Asthma, moderate persistent, well-controlled  - fluticasone furoate-vilanterol (BREO ELLIPTA) 100-25 MCG/INH AEPB; Inhale 1 puff into the lungs daily.  Dispense: 60 each; Refill: 5 - benzonatate (TESSALON) 100 MG capsule; Take 1-2 capsules (100-200 mg total) by mouth 3 (three) times daily as needed.  Dispense: 40 capsule; Refill: 0  5. Primary osteoarthritis involving multiple joints  She has been unable to wean off nsaid's she knows long term risk  - meloxicam (MOBIC) 15 MG tablet; Take 1 tablet (15 mg total) by mouth daily.  Dispense: 90 tablet; Refill: 1  6. Senile purpura (HCC)  stable  7. Peripheral polyneuropathy  Recheck B12  8. Vitamin B12 deficiency  - CBC with Differential/Platelet - Vitamin B12  9. Vitamin D deficiency  - VITAMIN D 25 Hydroxy (Vit-D Deficiency, Fractures)  10. Gastro-esophageal reflux disease without esophagitis  - esomeprazole (NEXIUM) 40 MG capsule; Take 1 capsule (40 mg total) by mouth daily.  Dispense: 90 capsule; Refill: 1  11. Hyperglycemia  - Hemoglobin A1c  12. Long-term use of high-risk medication  - CBC with Differential/Platelet - COMPLETE METABOLIC PANEL WITH GFR

## 2018-08-23 NOTE — Progress Notes (Signed)
Subjective:   Christie Hanson is a 72 y.o. female who presents for an Initial Medicare Annual Wellness Visit.  Review of Systems    N/A  Cardiac Risk Factors include: advanced age (>11men, >72 women);sedentary lifestyle;obesity (BMI >30kg/m2)     Objective:    Today's Vitals   08/23/18 1036  BP: 126/72  Pulse: 68  Resp: 14  Temp: 97.8 F (36.6 C)  TempSrc: Oral  SpO2: 94%  Weight: 293 lb 6.4 oz (133.1 kg)  Height: 5\' 3"  (1.6 m)   Body mass index is 51.97 kg/m.  Advanced Directives 08/23/2018 03/02/2017 07/29/2016 01/27/2016 07/29/2015  Does Patient Have a Medical Advance Directive? Yes Yes Yes Yes No  Type of Estate agent of [place];Living will Healthcare Power of [place];Living will Healthcare Power of [place];Living will Healthcare Power of [place];Living will -  Does patient want to make changes to medical advance directive? - - No - Patient declined - -  Copy of Healthcare Power of Attorney in Chart? No - copy requested No - copy requested No - copy requested No - copy requested -  Would patient like information on creating a medical advance directive? - - - - No - patient declined information    Current Medications (verified) Outpatient Encounter Medications as of 08/23/2018  Medication Sig  . acetaminophen (TYLENOL) 500 MG tablet Take 1,000 mg by mouth at bedtime.   Marland Kitchen albuterol (PROAIR HFA) 108 (90 Base) MCG/ACT inhaler INHALE TWO PUFFS EVERY 6 HOURS AS NEEDEDWHEEZING  . BREO ELLIPTA 100-25 MCG/INH AEPB INHALE 1 PUFF INTO THE LUNGS DAILY.  Marland Kitchen Cholecalciferol (VITAMIN D) 2000 UNITS tablet Take 1 tablet by mouth daily. Bedtime  . DULoxetine (CYMBALTA) 30 MG capsule Take 60 mg by mouth daily.  Marland Kitchen esomeprazole (NEXIUM) 40 MG capsule Take 1 capsule (40 mg total) by mouth daily.  Marland Kitchen MAGNESIUM MALATE PO Take 1 tablet by mouth daily.  . meloxicam (MOBIC) 15 MG tablet Take 1 tablet (15 mg total) by mouth daily.  . Multiple Vitamins-Minerals (ICAPS  AREDS 2) CAPS Take 2 capsules by mouth daily.   . Probiotic Product (SOLUBLE FIBER/PROBIOTICS PO) Take 1 capsule by mouth daily.  . vitamin B-12 (CYANOCOBALAMIN) 1000 MCG tablet Take 1,000 mcg by mouth daily. Bedtime   No facility-administered encounter medications on file as of 08/23/2018.     Allergies (verified) Morphine; Penicillins; and Sulfa antibiotics   History: Past Medical History:  Diagnosis Date  . Allergy   . Asthma    wheezing  . Blurred vision   . Candidiasis of mouth   . Depression    hx of  . GERD (gastroesophageal reflux disease)   . Herniated disc, cervical   . History of carpal tunnel release   . Hyperglycemia   . Metabolic syndrome   . Obesity   . Osteoarthritis   . Ovarian deficiency   . Vitamin D deficiency    Past Surgical History:  Procedure Laterality Date  . ABDOMINAL HYSTERECTOMY  1991  . BILATERAL SALPINGOOPHORECTOMY    . CARPAL TUNNEL RELEASE Bilateral [id number]   [facility]  . CATARACT EXTRACTION W/PHACO Left 09/23/2015   Procedure: CATARACT EXTRACTION PHACO AND INTRAOCULAR LENS PLACEMENT (IOC);  Surgeon: Lockie Mola, MD;  Location: [facility] ORS;  Service: Ophthalmology;  Laterality: Left;  [place] 00:57 AP% 12.2 6.98 CDE Fluid pack lot # [id number]  . CATARACT EXTRACTION W/PHACO Right 10/17/2015   Procedure: CATARACT EXTRACTION PHACO AND INTRAOCULAR LENS PLACEMENT (IOC);  Surgeon: Lockie Mola, MD;  Location: [facility]  ORS;  Service: Ophthalmology;  Laterality: Right;  [place]  00:30.5 AP   00:47.2 CDE  6.05 casette lot #[id number]  . DandC    . DILATION AND CURETTAGE OF UTERUS  1990  . HERNIA REPAIR  1992   ventral   Family History  Problem Relation Age of Onset  . Hypertension Mother   . Cancer Father        prostate  . CVA Father   . Depression Father   . Healthy Sister   . Cancer Brother        prostate   Social History   Socioeconomic History  . Marital status: Married    Spouse name: Maisie Fus  . Number of children: 4  . Years of  education: Not on file  . Highest education level: Associate degree: occupational, Scientist, product/process development, or vocational program  Occupational History  . Occupation: Retired  Engineer, production  . Financial resource strain: Not hard at all  . Food insecurity:    Worry: Never true    Inability: Never true  . Transportation needs:    Medical: No    Non-medical: No  Tobacco Use  . Smoking status: Never Smoker  . Smokeless tobacco: Never Used  . Tobacco comment: smoking cessation materials not required  Substance and Sexual Activity  . Alcohol use: No    Alcohol/week: 0.0 standard drinks  . Drug use: No  . Sexual activity: Yes    Partners: Male  Lifestyle  . Physical activity:    Days per week: 0 days    Minutes per session: 0 min  . Stress: Not at all  Relationships  . Social connections:    Talks on phone: Patient refused    Gets together: Patient refused    Attends religious service: Patient refused    Active member of club or organization: Patient refused    Attends meetings of clubs or organizations: Patient refused    Relationship status: Married  Other Topics Concern  . Not on file  Social History Narrative  . Not on file    Tobacco Counseling Counseling given: No Comment: smoking cessation materials not required  Clinical Intake:  Pre-visit preparation completed: Yes  Pain : No/denies pain   BMI - recorded: 51.97 Nutritional Status: BMI > 30  Obese Nutritional Risks: None Diabetes: No  How often do you need to have someone help you when you read instructions, pamphlets, or other written materials from your doctor or pharmacy?: 1 - Never  Interpreter Needed?: No  Information entered by :: AEversole, LPN   Activities of Daily Living In your present state of health, do you have any difficulty performing the following activities: 08/23/2018 03/14/2018  Hearing? N N  Comment denies hearing aids -  Vision? N N  Comment wears eyeglasses; macular degeneration -  Difficulty  concentrating or making decisions? N N  Walking or climbing stairs? Y Y  Comment avoids stairs; knee pain; ambulates with cane -  Dressing or bathing? N N  Doing errands, shopping? N N  Preparing Food and eating ? N -  Comment denies dentures -  Using the Toilet? N -  In the past six months, have you accidently leaked urine? Y -  Comment stress incontinence -  Do you have problems with loss of bowel control? N -  Managing your Medications? N -  Managing your Finances? N -  Housekeeping or managing your Housekeeping? N -  Some recent data might be hidden     Immunizations and  Health Maintenance Immunization History  Administered Date(s) Administered  . Influenza, High Dose Seasonal PF 09/03/2017, 08/23/2018  . Influenza-Unspecified 09/16/2012, 10/01/2015  . Pneumococcal Conjugate-13 07/09/2014  . Pneumococcal Polysaccharide-23 09/28/2011  . Tdap 11/16/2008  . Zoster Recombinat (Shingrix) 12/16/2016, 04/02/2017   Health Maintenance Due  Topic Date Due  . MAMMOGRAM  02/02/2017    Patient Care Team: Alba Cory, MD as PCP - General (Family Medicine)  Indicate any recent Medical Services you may have received from other than [facility] providers in the past year (date may be approximate).     Assessment:   This is a routine wellness examination for [employer].  Hearing/Vision screen Vision Screening Comments: Sees Dr. Inez Pilgrim for annual eye exams  Dietary issues and exercise activities discussed: Current Exercise Habits: The patient does not participate in regular exercise at present, Exercise limited by: None identified  Goals    . DIET - REDUCE PORTION SIZE     Recommend to decrease portion sizes by eating 3 small healthy meals and at least 2 healthy snacks per day.    . Exercise 150 min/wk Moderate Activity     Recommend to exercise for at least 150 minutes per week.      Depression Screen PHQ 2/9 Scores 08/23/2018 03/14/2018 09/03/2017 03/02/2017 07/29/2016  01/27/2016 07/29/2015  PHQ - 2 Score 6 1 0 0 0 0 1  PHQ- 9 Score 17 3 2  - - - -    Pt states she has resumed use of Cymbalta approx 3 weeks ago. Is wanting to discuss with Dr. Carlynn Purl today about resuming the use and refilling this medication.  Fall Risk Fall Risk  08/23/2018 09/03/2017 03/02/2017 07/29/2016 01/27/2016  Falls in the past year? No No No No Yes  Number falls in past yr: - - - - 1  Injury with Fall? - - - - No  Risk for fall due to : Impaired vision;Impaired balance/gait;Medication side effect - - - -  Risk for fall due to: Comment wears eyeglasses; macular degeneration; avoids stairs; knee pain; ambulates with cane - - - -    FALL RISK PREVENTION PERTAINING TO THE HOME:  Any stairs in or around the home WITH handrails? Has a ramp and lives in a one story home Home free of loose throw rugs in walkways, pet beds, electrical cords, etc? Yes  Adequate lighting in your home to reduce risk of falls? Yes   ASSISTIVE DEVICES UTILIZED TO PREVENT FALLS:  Life alert? No  Use of a cane, walker or w/c? Yes, cane. Will use motorized w/c when grocery shopping Grab bars in the bathroom? Yes  Shower chair or bench in shower? Yes  Elevated toilet seat or a handicapped toilet? Yes   DME ORDERS:  DME order needed?  No   TIMED UP AND GO:  Was the test performed? Yes .  Length of time to ambulate 10 feet: 29 sec.   GAIT:  Appearance of gait: Gait slow, steady and with the use of an assistive device.  Education: Fall risk prevention has been discussed.  Intervention(s) required? No   Cognitive Function:     6CIT Screen 08/23/2018  What Year? 0 points  What month? 0 points  What time? 0 points  Count back from 20 0 points  Months in reverse 0 points  Repeat phrase 0 points  Total Score 0    Screening Tests Health Maintenance  Topic Date Due  . MAMMOGRAM  02/02/2017  . TETANUS/TDAP  11/16/2018  . Fecal  DNA (Cologuard)  04/20/2021  . INFLUENZA VACCINE  Completed  . DEXA  SCAN  Completed  . Hepatitis C Screening  Completed  . PNA vac Low Risk Adult  Completed    Qualifies for Shingles Vaccine? No  Completed Shingrix series.  Flu Vaccine: Due for Flu vaccine. Does the patient want to receive this vaccine today?  Yes    Cancer Screenings:  Colorectal Screening: Completed Cologuard 04/19/18. Repeat every 3 years  Mammogram: Completed 02/13/16. Repeat every year. Ordered 03/14/18 but not yet completed. Education provided re: importance of this screening. Pt provided with contact info and advised to call to schedule appt.   Bone Density: Completed 06/16/10. Repeat every 2 years. Declined my offer to order this screening today. Education provided re: importance of this screening but still declined my offer to order this screening.  Lung Cancer Screening: (Low Dose CT Chest recommended if Age 69-80 years, 30 pack-year currently smoking OR have quit w/in 15years.) does not qualify.   Additional Screening:  Hepatitis C Screening: Completed 12/25/13  Vision Screening: Recommended annual ophthalmology exams for early detection of glaucoma and other disorders of the eye. Is the patient up to date with their annual eye exam?  Yes  Who is the provider or what is the name of the office in which the pt attends annual eye exams? Dr. Inez Pilgrim  Dental Screening: Recommended annual dental exams for proper oral hygiene  Community Resource Referral:  CRR required this visit?  No    Plan:  I have personally reviewed and addressed the Medicare Annual Wellness questionnaire and have noted the following in the patient's chart:  A. Medical and social history B. Use of alcohol, tobacco or illicit drugs  C. Current medications and supplements D. Functional ability and status E.  Nutritional status F.  Physical activity G. Advance directives H. List of other physicians I.  Hospitalizations, surgeries, and ER visits in previous 12 months J.  Vitals K. Screenings such as  hearing and vision if needed, cognitive and depression L. Referrals and appointments  In addition, I have reviewed and discussed with patient certain preventive protocols, quality metrics, and best practice recommendations. A written personalized care plan for preventive services as well as general preventive health recommendations were provided to patient.  See attached scanned questionnaire for additional information.   Signed,  Deon Pilling, LPN Nurse Health Advisor

## 2018-08-23 NOTE — Patient Instructions (Addendum)
Christie Hanson , Thank you for taking time to come for your Medicare Wellness Visit. I appreciate your ongoing commitment to your health goals. Please review the following plan we discussed and let me know if I can assist you in the future.   Screening recommendations/referrals: Colorectal Screening: Up to date Mammogram: Please call to schedule your appointment Bone Density: Declined  Vision and Dental Exams: Recommended annual ophthalmology exams for early detection of glaucoma and other disorders of the eye Recommended annual dental exams for proper oral hygiene  Vaccinations: Influenza vaccine: Completed today Pneumococcal vaccine: Up to date Tdap vaccine: Up to date Shingles vaccine: Please call your insurance company to determine your out of pocket expense for the Shingrix vaccine. You may receive this vaccine at your local pharmacy.  Advanced directives: Please bring a copy of your POA (Power of Attorney) and/or Living Will to your next appointment.  Goals: Recommend to exercise for at least 150 minutes per week.  Next appointment: Please schedule your Annual Wellness Visit with your Nurse Health Advisor in one year.  Preventive Care 30 Years and Older, Female Preventive care refers to lifestyle choices and visits with your health care provider that can promote health and wellness. What does preventive care include?  A yearly physical exam. This is also called an annual well check.  Dental exams once or twice a year.  Routine eye exams. Ask your health care provider how often you should have your eyes checked.  Personal lifestyle choices, including:  Daily care of your teeth and gums.  Regular physical activity.  Eating a healthy diet.  Avoiding tobacco and drug use.  Limiting alcohol use.  Practicing safe sex.  Taking low-dose aspirin every day if recommended by your health care provider.  Taking vitamin and mineral supplements as recommended by your health care  provider. What happens during an annual well check? The services and screenings done by your health care provider during your annual well check will depend on your age, overall health, lifestyle risk factors, and family history of disease. Counseling  Your health care provider may ask you questions about your:  Alcohol use.  Tobacco use.  Drug use.  Emotional well-being.  Home and relationship well-being.  Sexual activity.  Eating habits.  History of falls.  Memory and ability to understand (cognition).  Work and work Astronomer.  Reproductive health. Screening  You may have the following tests or measurements:  Height, weight, and BMI.  Blood pressure.  Lipid and cholesterol levels. These may be checked every 5 years, or more frequently if you are over 94 years old.  Skin check.  Lung cancer screening. You may have this screening every year starting at age 52 if you have a 30-pack-year history of smoking and currently smoke or have quit within the past 15 years.  Fecal occult blood test (FOBT) of the stool. You may have this test every year starting at age 28.  Flexible sigmoidoscopy or colonoscopy. You may have a sigmoidoscopy every 5 years or a colonoscopy every 10 years starting at age 45.  Hepatitis C blood test.  Hepatitis B blood test.  Sexually transmitted disease (STD) testing.  Diabetes screening. This is done by checking your blood sugar (glucose) after you have not eaten for a while (fasting). You may have this done every 1-3 years.  Bone density scan. This is done to screen for osteoporosis. You may have this done starting at age 54.  Mammogram. This may be done every 1-2 years.  Talk to your health care provider about how often you should have regular mammograms. Talk with your health care provider about your test results, treatment options, and if necessary, the need for more tests. Vaccines  Your health care provider may recommend certain  vaccines, such as:  Influenza vaccine. This is recommended every year.  Tetanus, diphtheria, and acellular pertussis (Tdap, Td) vaccine. You may need a Td booster every 10 years.  Zoster vaccine. You may need this after age 23.  Pneumococcal 13-valent conjugate (PCV13) vaccine. One dose is recommended after age 22.  Pneumococcal polysaccharide (PPSV23) vaccine. One dose is recommended after age 70. Talk to your health care provider about which screenings and vaccines you need and how often you need them. This information is not intended to replace advice given to you by your health care provider. Make sure you discuss any questions you have with your health care provider. Document Released: 11/29/2015 Document Revised: 07/22/2016 Document Reviewed: 09/03/2015 Elsevier Interactive Patient Education  2017 [place] Prevention in the Home Falls can cause injuries. They can happen to people of all ages. There are many things you can do to make your home safe and to help prevent falls. What can I do on the outside of my home?  Regularly fix the edges of walkways and driveways and fix any cracks.  Remove anything that might make you trip as you walk through a door, such as a raised step or threshold.  Trim any bushes or trees on the path to your home.  Use bright outdoor lighting.  Clear any walking paths of anything that might make someone trip, such as rocks or tools.  Regularly check to see if handrails are loose or broken. Make sure that both sides of any steps have handrails.  Any raised decks and porches should have guardrails on the edges.  Have any leaves, snow, or ice cleared regularly.  Use sand or salt on walking paths during winter.  Clean up any spills in your garage right away. This includes oil or grease spills. What can I do in the bathroom?  Use night lights.  Install grab bars by the toilet and in the tub and shower. Do not use towel bars as grab  bars.  Use non-skid mats or decals in the tub or shower.  If you need to sit down in the shower, use a plastic, non-slip stool.  Keep the floor dry. Clean up any water that spills on the floor as soon as it happens.  Remove soap buildup in the tub or shower regularly.  Attach bath mats securely with double-sided non-slip rug tape.  Do not have throw rugs and other things on the floor that can make you trip. What can I do in the bedroom?  Use night lights.  Make sure that you have a light by your bed that is easy to reach.  Do not use any sheets or blankets that are too big for your bed. They should not hang down onto the floor.  Have a firm chair that has side arms. You can use this for support while you get dressed.  Do not have throw rugs and other things on the floor that can make you trip. What can I do in the kitchen?  Clean up any spills right away.  Avoid walking on wet floors.  Keep items that you use a lot in easy-to-reach places.  If you need to reach something above you, use a strong step stool  that has a grab bar.  Keep electrical cords out of the way.  Do not use floor polish or wax that makes floors slippery. If you must use wax, use non-skid floor wax.  Do not have throw rugs and other things on the floor that can make you trip. What can I do with my stairs?  Do not leave any items on the stairs.  Make sure that there are handrails on both sides of the stairs and use them. Fix handrails that are broken or loose. Make sure that handrails are as long as the stairways.  Check any carpeting to make sure that it is firmly attached to the stairs. Fix any carpet that is loose or worn.  Avoid having throw rugs at the top or bottom of the stairs. If you do have throw rugs, attach them to the floor with carpet tape.  Make sure that you have a light switch at the top of the stairs and the bottom of the stairs. If you do not have them, ask someone to add them for  you. What else can I do to help prevent falls?  Wear shoes that:  Do not have high heels.  Have rubber bottoms.  Are comfortable and fit you well.  Are closed at the toe. Do not wear sandals.  If you use a stepladder:  Make sure that it is fully opened. Do not climb a closed stepladder.  Make sure that both sides of the stepladder are locked into place.  Ask someone to hold it for you, if possible.  Clearly mark and make sure that you can see:  Any grab bars or handrails.  First and last steps.  Where the edge of each step is.  Use tools that help you move around (mobility aids) if they are needed. These include:  Canes.  Walkers.  Scooters.  Crutches.  Turn on the lights when you go into a dark area. Replace any light bulbs as soon as they burn out.  Set up your furniture so you have a clear path. Avoid moving your furniture around.  If any of your floors are uneven, fix them.  If there are any pets around you, be aware of where they are.  Review your medicines with your doctor. Some medicines can make you feel dizzy. This can increase your chance of falling. Ask your doctor what other things that you can do to help prevent falls. This information is not intended to replace advice given to you by your health care provider. Make sure you discuss any questions you have with your health care provider. Document Released: 08/29/2009 Document Revised: 04/09/2016 Document Reviewed: 12/07/2014 Elsevier Interactive Patient Education  2017 [employer].

## 2018-08-24 ENCOUNTER — Encounter: Payer: Self-pay | Admitting: Family Medicine

## 2018-08-24 LAB — COMPLETE METABOLIC PANEL WITH GFR
AG Ratio: 1.6 (calc) (ref 1.0–2.5)
ALT: 11 U/L (ref 6–29)
AST: 15 U/L (ref 10–35)
Albumin: 4.3 g/dL (ref 3.6–5.1)
Alkaline phosphatase (APISO): 92 U/L (ref 33–130)
BUN: 18 mg/dL (ref 7–25)
CO2: 31 mmol/L (ref 20–32)
CREATININE: 0.68 mg/dL (ref 0.60–0.93)
Calcium: 9.6 mg/dL (ref 8.6–10.4)
Chloride: 100 mmol/L (ref 98–110)
GFR, Est African American: 101 mL/min/{1.73_m2} (ref >=60)
GFR, Est Non African American: 87 mL/min/{1.73_m2} (ref >=60)
GLOBULIN: 2.7 g/dL (ref 1.9–3.7)
GLUCOSE: 102 mg/dL (ref 65–139)
Potassium: 4.6 mmol/L (ref 3.5–5.3)
Sodium: 136 mmol/L (ref 135–146)
Total Bilirubin: 0.4 mg/dL (ref 0.2–1.2)
Total Protein: 7 g/dL (ref 6.1–8.1)

## 2018-08-24 LAB — CBC WITH DIFFERENTIAL/PLATELET
Basophils Absolute: 46 cells/uL (ref 0–200)
Basophils Relative: 0.5 %
EOS PCT: 2.5 %
Eosinophils Absolute: 228 cells/uL (ref 15–500)
HEMATOCRIT: 39.4 % (ref 35.0–45.0)
Hemoglobin: 13.4 g/dL (ref 11.7–15.5)
LYMPHS ABS: 2466 {cells}/uL (ref 850–3900)
MCH: 29.8 pg (ref 27.0–33.0)
MCHC: 34 g/dL (ref 32.0–36.0)
MCV: 87.6 fL (ref 80.0–100.0)
MONOS PCT: 5.5 %
MPV: 11 fL (ref 7.5–12.5)
NEUTROS PCT: 64.4 %
Neutro Abs: 5860 cells/uL (ref 1500–7800)
PLATELETS: 269 10*3/uL (ref 140–400)
RBC: 4.5 10*6/uL (ref 3.80–5.10)
RDW: 13.1 % (ref 11.0–15.0)
Total Lymphocyte: 27.1 %
WBC mixed population: 501 cells/uL (ref 200–950)
WBC: 9.1 10*3/uL (ref 3.8–10.8)

## 2018-08-24 LAB — HEMOGLOBIN A1C
HEMOGLOBIN A1C: 5.4 %{Hb} (ref <5.7)
Mean Plasma Glucose: 108 (calc)
eAG (mmol/L): 6 (calc)

## 2018-08-24 LAB — VITAMIN D 25 HYDROXY (VIT D DEFICIENCY, FRACTURES): VIT D 25 HYDROXY: 31 ng/mL (ref 30–100)

## 2018-08-24 LAB — VITAMIN B12: Vitamin B-12: 1238 pg/mL — ABNORMAL HIGH (ref 200–1100)

## 2018-09-15 ENCOUNTER — Other Ambulatory Visit: Payer: Self-pay | Admitting: Family Medicine

## 2018-09-15 DIAGNOSIS — K219 Gastro-esophageal reflux disease without esophagitis: Secondary | ICD-10-CM

## 2018-11-29 ENCOUNTER — Ambulatory Visit: Payer: Medicare Other | Admitting: Family Medicine

## 2018-11-29 ENCOUNTER — Encounter: Payer: Self-pay | Admitting: Family Medicine

## 2018-11-29 VITALS — BP 144/86 | HR 88 | Temp 97.8°F | Resp 16 | Ht 63.0 in | Wt 293.4 lb

## 2018-11-29 DIAGNOSIS — D692 Other nonthrombocytopenic purpura: Secondary | ICD-10-CM | POA: Diagnosis not present

## 2018-11-29 DIAGNOSIS — G5711 Meralgia paresthetica, right lower limb: Secondary | ICD-10-CM

## 2018-11-29 DIAGNOSIS — F330 Major depressive disorder, recurrent, mild: Secondary | ICD-10-CM

## 2018-11-29 NOTE — Progress Notes (Signed)
Name: Christie Hanson   MRN: [id number]    DOB: 08-12-1946   Date:11/29/2018       Progress Note  Subjective  Chief Complaint  Chief Complaint  Patient presents with  . Depression  . Anxiety    HPI  Meralgia paresthetica: it was present for many years, however this Summer the pain was daily and very intense, burning like , right lateral thigh, not associated with weakness, pain is zero with Duloxetine   Depression: she stopped taking all medication back in 2017.Took Prozac and Abilify for many years prior to that . She stopped because of side effects, like tremors Tried cymbalta but it made her feel "weird" and stopped medication, it made her feel anxious, however decided to go back on medication in 07/2018 because of meralgia paresthetica and depression got much worse - phq 9 up to 17 , she is taking duloxetine 60 mg daily and states feels foggy minded, however tried to wean self off and meralgia paresthetica returned, she states she wants to stay on medication. phq 9 has improved also, down to 3 today,    Patient Active Problem List   Diagnosis Date Noted  . Senile purpura (HCC) 03/14/2018  . Age-related macular degeneration 03/14/2018  . Morbid obesity (HCC) 03/14/2018  . Peripheral polyneuropathy 03/02/2017  . Vitamin B12 deficiency 07/29/2015  . Carpal tunnel syndrome 07/27/2015  . Osteoarthritis 07/27/2015  . Depression, major, recurrent, mild (HCC) 07/27/2015  . Gastro-esophageal reflux disease without esophagitis 07/27/2015  . Displacement of cervical intervertebral disc without myelopathy 07/27/2015  . Blood glucose elevated 07/27/2015  . Dysmetabolic syndrome 07/27/2015  . Vitamin D deficiency 07/27/2015  . Sprain of rotator cuff capsule 07/27/2015  . Urgency of urination 07/27/2015  . Allergic rhinitis 07/27/2015  . Asthma, moderate persistent 07/27/2015    Past Surgical History:  Procedure Laterality Date  . ABDOMINAL HYSTERECTOMY  1991  . BILATERAL  SALPINGOOPHORECTOMY    . CARPAL TUNNEL RELEASE Bilateral [id number]   [facility]  . CATARACT EXTRACTION W/PHACO Left 09/23/2015   Procedure: CATARACT EXTRACTION PHACO AND INTRAOCULAR LENS PLACEMENT (IOC);  Surgeon: Lockie Mola, MD;  Location: [facility] ORS;  Service: Ophthalmology;  Laterality: Left;  [place] 00:57 AP% 12.2 6.98 CDE Fluid pack lot # [id number]  . CATARACT EXTRACTION W/PHACO Right 10/17/2015   Procedure: CATARACT EXTRACTION PHACO AND INTRAOCULAR LENS PLACEMENT (IOC);  Surgeon: Lockie Mola, MD;  Location: [facility] ORS;  Service: Ophthalmology;  Laterality: Right;  [place]  00:30.5 AP   00:47.2 CDE  6.05 casette lot #[id number]  . DandC    . DILATION AND CURETTAGE OF UTERUS  1990  . HERNIA REPAIR  1992   ventral    Family History  Problem Relation Age of Onset  . Hypertension Mother   . Cancer Father        prostate  . CVA Father   . Depression Father   . Healthy Sister   . Cancer Brother        prostate    Social History   Socioeconomic History  . Marital status: Married    Spouse name: Maisie Fus  . Number of children: 4  . Years of education: Not on file  . Highest education level: Associate degree: occupational, Scientist, product/process development, or vocational program  Occupational History  . Occupation: Retired  Engineer, production  . Financial resource strain: Not hard at all  . Food insecurity:    Worry: Never true    Inability: Never true  . Transportation needs:    Medical:  No    Non-medical: No  Tobacco Use  . Smoking status: Never Smoker  . Smokeless tobacco: Never Used  . Tobacco comment: smoking cessation materials not required  Substance and Sexual Activity  . Alcohol use: No    Alcohol/week: 0.0 standard drinks  . Drug use: No  . Sexual activity: Yes    Partners: Male  Lifestyle  . Physical activity:    Days per week: 0 days    Minutes per session: 0 min  . Stress: Not at all  Relationships  . Social connections:    Talks on phone: Patient refused    Gets together: Patient  refused    Attends religious service: Patient refused    Active member of club or organization: Patient refused    Attends meetings of clubs or organizations: Patient refused    Relationship status: Married  . Intimate partner violence:    Fear of current or ex partner: No    Emotionally abused: No    Physically abused: No    Forced sexual activity: No  Other Topics Concern  . Not on file  Social History Narrative  . Not on file     Current Outpatient Medications:  .  acetaminophen (TYLENOL) 500 MG tablet, Take 1,000 mg by mouth at bedtime. , Disp: , Rfl:  .  albuterol (PROAIR HFA) 108 (90 Base) MCG/ACT inhaler, INHALE TWO PUFFS EVERY 6 HOURS AS NEEDEDWHEEZING, Disp: 8.5 g, Rfl: 1 .  benzonatate (TESSALON) 100 MG capsule, Take 1-2 capsules (100-200 mg total) by mouth 3 (three) times daily as needed., Disp: 40 capsule, Rfl: 0 .  Cholecalciferol (VITAMIN D) 2000 UNITS tablet, Take 1 tablet by mouth daily. Bedtime, Disp: , Rfl:  .  DULoxetine (CYMBALTA) 60 MG capsule, Take 1 capsule (60 mg total) by mouth daily., Disp: 90 capsule, Rfl: 1 .  esomeprazole (NEXIUM) 40 MG capsule, Take 1 capsule (40 mg total) by mouth daily., Disp: 90 capsule, Rfl: 1 .  fluticasone furoate-vilanterol (BREO ELLIPTA) 100-25 MCG/INH AEPB, Inhale 1 puff into the lungs daily., Disp: 60 each, Rfl: 5 .  MAGNESIUM MALATE PO, Take 1 tablet by mouth daily., Disp: , Rfl:  .  meloxicam (MOBIC) 15 MG tablet, Take 1 tablet (15 mg total) by mouth daily., Disp: 90 tablet, Rfl: 1 .  Multiple Vitamins-Minerals (ICAPS AREDS 2) CAPS, Take 2 capsules by mouth daily. , Disp: , Rfl:  .  Probiotic Product (SOLUBLE FIBER/PROBIOTICS PO), Take 1 capsule by mouth daily., Disp: , Rfl:  .  vitamin B-12 (CYANOCOBALAMIN) 1000 MCG tablet, Take 1,000 mcg by mouth daily. Bedtime, Disp: , Rfl:   Allergies  Allergen Reactions  . Morphine   . Penicillins   . Sulfa Antibiotics     I personally reviewed active problem list, medication list,  allergies, family history, social history with the patient/caregiver today.   ROS  Constitutional: Negative for fever or weight change.  Respiratory: Positive  for intermittent cough and shortness of breath.   Cardiovascular: Negative for chest pain or palpitations.  Gastrointestinal: Negative for abdominal pain, no bowel changes.  Musculoskeletal: Negative for gait problem or joint swelling.  Skin: Negative for rash. She has senile purpura  Neurological: Negative for dizziness or headache.  No other specific complaints in a complete review of systems (except as listed in HPI above).  Objective  Vitals:   11/29/18 1044  BP: (!) 144/80  Pulse: 88  Resp: 16  Temp: 97.8 F (36.6 C)  TempSrc: Oral  SpO2: 97%  Weight: 293 lb 6.4 oz (133.1 kg)  Height: 5\' 3"  (1.6 m)    Body mass index is 51.97 kg/m.  Physical Exam  Constitutional: Patient appears well-developed and well-nourished. Obese  No distress.  HEENT: head atraumatic, normocephalic, pupils equal and reactive to light,  neck supple, throat within normal limits Cardiovascular: Normal rate, regular rhythm and normal heart sounds.  No murmur heard. No BLE edema. Pulmonary/Chest: Effort normal and breath sounds normal. No respiratory distress. Abdominal: Soft.  There is no tenderness. Skin: senile purpura  Psychiatric: Patient has a normal mood and affect. behavior is normal. Judgment and thought content normal.  PHQ2/9: Depression screen [facility] 2/9 11/29/2018 08/23/2018 03/14/2018 09/03/2017 03/02/2017  Decreased Interest 1 3 1  0 0  Down, Depressed, Hopeless 0 3 0 0 0  PHQ - 2 Score 1 6 1  0 0  Altered sleeping 0 3 0 0 -  Tired, decreased energy 1 3 2 2  -  Change in appetite 0 3 0 0 -  Feeling bad or failure about yourself  1 2 0 0 -  Trouble concentrating 0 0 0 0 -  Moving slowly or fidgety/restless 0 0 0 0 -  Suicidal thoughts 0 0 0 - -  PHQ-9 Score 3 17 3 2  -  Difficult doing work/chores Somewhat difficult Somewhat  difficult Somewhat difficult Not difficult at all -     Fall Risk: Fall Risk  11/29/2018 08/23/2018 09/03/2017 03/02/2017 07/29/2016  Falls in the past year? 0 No No No No  Number falls in past yr: 0 - - - -  Injury with Fall? 0 - - - -  Risk for fall due to : - Impaired vision;Impaired balance/gait;Medication side effect - - -  Risk for fall due to: Comment - wears eyeglasses; macular degeneration; avoids stairs; knee pain; ambulates with cane - - -      Assessment & Plan  1. Mild recurrent major depression (HCC)  Doing better, continue duloxetine   2. Morbid obesity (HCC)  Discussed with the patient the risk posed by an increased BMI. Discussed importance of portion control, calorie counting and at least 150 minutes of physical activity weekly. Avoid sweet beverages and drink more water. Eat at least 6 servings of fruit and vegetables daily    3. Senile purpura (HCC)  Stable   4. Meralgia paresthetica of right side  Doing much better with duloxetine

## 2019-02-12 ENCOUNTER — Other Ambulatory Visit: Payer: Self-pay | Admitting: Family Medicine

## 2019-02-12 DIAGNOSIS — F331 Major depressive disorder, recurrent, moderate: Secondary | ICD-10-CM

## 2019-02-21 ENCOUNTER — Telehealth: Payer: Self-pay | Admitting: Family Medicine

## 2019-02-21 DIAGNOSIS — M159 Polyosteoarthritis, unspecified: Secondary | ICD-10-CM

## 2019-02-21 DIAGNOSIS — M150 Primary generalized (osteo)arthritis: Principal | ICD-10-CM

## 2019-02-21 NOTE — Telephone Encounter (Addendum)
Refill request for general medication: Meloxicam 15 mg  Last office visit: 11/29/2018  Follow-ups on file. 03/08/2019

## 2019-03-08 ENCOUNTER — Ambulatory Visit: Payer: Medicare Other | Admitting: Family Medicine

## 2019-04-09 ENCOUNTER — Other Ambulatory Visit: Payer: Self-pay | Admitting: Family Medicine

## 2019-04-09 DIAGNOSIS — J4540 Moderate persistent asthma, uncomplicated: Secondary | ICD-10-CM

## 2019-05-08 ENCOUNTER — Other Ambulatory Visit: Payer: Self-pay | Admitting: Family Medicine

## 2019-05-08 DIAGNOSIS — F331 Major depressive disorder, recurrent, moderate: Secondary | ICD-10-CM

## 2019-05-09 ENCOUNTER — Other Ambulatory Visit: Payer: Self-pay | Admitting: Family Medicine

## 2019-05-09 DIAGNOSIS — K219 Gastro-esophageal reflux disease without esophagitis: Secondary | ICD-10-CM

## 2019-05-18 ENCOUNTER — Other Ambulatory Visit: Payer: Self-pay | Admitting: Family Medicine

## 2019-05-18 DIAGNOSIS — M159 Polyosteoarthritis, unspecified: Secondary | ICD-10-CM

## 2019-05-18 NOTE — Telephone Encounter (Signed)
Refill request for general medication. Meloxicam to [facility]  Last office visit 11/29/2018   Follow up on 08/25/2019

## 2019-08-02 ENCOUNTER — Other Ambulatory Visit: Payer: Self-pay | Admitting: Family Medicine

## 2019-08-02 DIAGNOSIS — K219 Gastro-esophageal reflux disease without esophagitis: Secondary | ICD-10-CM

## 2019-08-02 DIAGNOSIS — F331 Major depressive disorder, recurrent, moderate: Secondary | ICD-10-CM

## 2019-08-02 NOTE — Telephone Encounter (Signed)
Requested medication (s) are due for refill today: yes  Requested medication (s) are on the active medication list: yes  Last refill:  05/08/2019  Future visit scheduled: no  Notes to clinic:  Review for refill   Requested Prescriptions  Pending Prescriptions Disp Refills   DULoxetine (CYMBALTA) 60 MG capsule [Pharmacy Med Name: DULOXETINE HCL DR 60 MG CAP] 90 capsule 0    Sig: TAKE 1 [place]     Psychiatry: Antidepressants - SNRI Failed - 08/02/2019  2:02 AM      Failed - Last BP in normal range    BP Readings from Last 1 Encounters:  11/29/18 (!) 144/86         Failed - Valid encounter within last 6 months    Recent Outpatient Visits          8 months ago Mild recurrent major depression [facility])   [facility] Steele Sizer, MD   11 months ago Meralgia paresthetica of right side   [facility], Drue Stager, MD   1 year ago Depression, major, recurrent, mild [facility])   [facility] Steele Sizer, MD   1 year ago Depression, major, recurrent, mild [facility])   [facility] Steele Sizer, MD   2 years ago Dysmetabolic syndrome   [facility] Steele Sizer, MD      Future Appointments            In 3 weeks [place] - Completed PHQ-2 or PHQ-9 in the last 360 days.       esomeprazole (NEXIUM) 40 MG capsule [Pharmacy Med Name: ESOMEPRAZOLE MAG DR 40 MG CAP] 90 capsule 0    Sig: TAKE 1 CAPSULE BY MOUTH EVERY DAY     Gastroenterology: Proton Pump Inhibitors Passed - 08/02/2019  2:02 AM      Passed - Valid encounter within last 12 months    Recent Outpatient Visits          8 months ago Mild recurrent major depression [facility])   [facility] Steele Sizer, MD   11 months ago Meralgia paresthetica of right side   [facility] Steele Sizer, MD   1 year ago Depression,  major, recurrent, mild [facility])   [facility] Steele Sizer, MD   1 year ago Depression, major, recurrent, mild [facility])   [facility] Steele Sizer, MD   2 years ago Dysmetabolic syndrome   [facility] Steele Sizer, MD      Future Appointments            In 3 weeks [facility], [facility]

## 2019-08-09 ENCOUNTER — Ambulatory Visit (INDEPENDENT_AMBULATORY_CARE_PROVIDER_SITE_OTHER): Payer: Medicare Other | Admitting: Family Medicine

## 2019-08-09 ENCOUNTER — Encounter: Payer: Self-pay | Admitting: Family Medicine

## 2019-08-09 DIAGNOSIS — F330 Major depressive disorder, recurrent, mild: Secondary | ICD-10-CM

## 2019-08-09 DIAGNOSIS — R739 Hyperglycemia, unspecified: Secondary | ICD-10-CM

## 2019-08-09 DIAGNOSIS — K219 Gastro-esophageal reflux disease without esophagitis: Secondary | ICD-10-CM

## 2019-08-09 DIAGNOSIS — Z1231 Encounter for screening mammogram for malignant neoplasm of breast: Secondary | ICD-10-CM

## 2019-08-09 DIAGNOSIS — J4540 Moderate persistent asthma, uncomplicated: Secondary | ICD-10-CM

## 2019-08-09 DIAGNOSIS — D692 Other nonthrombocytopenic purpura: Secondary | ICD-10-CM

## 2019-08-09 DIAGNOSIS — M159 Polyosteoarthritis, unspecified: Secondary | ICD-10-CM

## 2019-08-09 DIAGNOSIS — G629 Polyneuropathy, unspecified: Secondary | ICD-10-CM

## 2019-08-09 DIAGNOSIS — E559 Vitamin D deficiency, unspecified: Secondary | ICD-10-CM

## 2019-08-09 DIAGNOSIS — M150 Primary generalized (osteo)arthritis: Secondary | ICD-10-CM | POA: Diagnosis not present

## 2019-08-09 DIAGNOSIS — E538 Deficiency of other specified B group vitamins: Secondary | ICD-10-CM

## 2019-08-09 MED ORDER — DULOXETINE HCL 60 MG PO CPEP: 60.0000 mg | ORAL_CAPSULE | Freq: Every day | ORAL | 1 refills | Status: DC

## 2019-08-09 MED ORDER — BREO ELLIPTA 100-25 MCG/INH IN AEPB: 1.0000 | INHALATION_SPRAY | Freq: Every day | RESPIRATORY_TRACT | 5 refills | Status: DC

## 2019-08-09 MED ORDER — MELOXICAM 15 MG PO TABS: 15.0000 mg | ORAL_TABLET | Freq: Every day | ORAL | 0 refills | Status: DC

## 2019-08-09 NOTE — Progress Notes (Signed)
Name: Christie Hanson   MRN: [id number]    DOB: 1946/04/23   Date:08/09/2019       Progress Note  Subjective  Chief Complaint  Chief Complaint  Patient presents with  . Medication Refill  . Depression  . Meralgia paresthetica  . Asthma  . Gastroesophageal Reflux  . Metabolic Syndrome    I connected with  Nonda Lou  on 08/09/19 at 11:00 AM EDT by telephone encounter   and verified that I am speaking with the correct person using two identifiers.  I discussed the limitations of evaluation and management by telemedicine and the availability of in person appointments. The patient expressed understanding and agreed to proceed. Staff also discussed with the patient that there may be a patient responsible charge related to this service. Patient Location: at home  Provider Location: [facility]    HPI  Metabolic Syndrome:last MPNT6R was 5.4%, she denies polyphagia, polydipsia or polyuria.Unchanged   Asthma Moderate:She is compliant with Breo.Shetakes loratadine prn only. Only has occasional dry cough, she states tessalon pereles helps when she has an URI. She states very seldom has a problem, has not used rescue inhaler in months   GERD: taking Nexium daily,she has been unable to wean self off. No heart burn as long as she takes medications . Unchanged   Neuropathy: she states she has burning sensation on both feet for many years, last B12 was normal but has history of b12 deficiency. She does not want to try Gabapentin or Lyrica at this time. She had tried duloxetine in the past and stopped, however currently taking 60 mg daily and is stable  Meralgia paresthetica: it was present for many years, however this Summer the pain was daily and very intense, burning like , right lateral thigh, not associated with weakness, she started to get very depressed and decided to go on duloxetine that she had at home and states pain is 0-3/10  It was 7/10 prior to  medication   Depression: she stopped taking all medication back in 2017.Took Prozac and Abilify for many years prior to that . She stopped because of side effects, like tremors Tried cymbalta but it made her feel "weird" and stopped medication, it made her feel anxious, however decided to go back on medication in 07/2018 because of meralgia paresthetica was  worsening  of depression last fall, also helps with meralgia paresthetica but causes a little mental fogginess, but does not want to stop medication  Obesity: weight is stable, she has some joint problems, sheis a member of [employer] in [place] , but not recently because of Pandemic   Vitamin D and Vitamin B12 deficiency: back to normal still on supplementation.  OA: she tried weaning self offMeloxicamand pain got much worse, since pandemic she has been less active and pain on both knees has been worse and she will contact Dr. Noemi Chapel to have another steroid injection at least on left knee. She states worse when she first gets up after rest. She has a cane and a walker that she uses them inside and outside the house.   Senile purpura: both arms and stable  Patient Active Problem List   Diagnosis Date Noted  . Senile purpura ([place]) 03/14/2018  . Age-related macular degeneration 03/14/2018  . Morbid obesity ([place]) 03/14/2018  . Peripheral polyneuropathy 03/02/2017  . Vitamin B12 deficiency 07/29/2015  . Carpal tunnel syndrome 07/27/2015  . Osteoarthritis 07/27/2015  . Depression, major, recurrent, mild ([place]) 07/27/2015  .  Gastro-esophageal reflux disease without esophagitis 07/27/2015  . Displacement of cervical intervertebral disc without myelopathy 07/27/2015  . Blood glucose elevated 07/27/2015  . Dysmetabolic syndrome 07/27/2015  . Vitamin D deficiency 07/27/2015  . Sprain of rotator cuff capsule 07/27/2015  . Urgency of urination 07/27/2015  . Allergic rhinitis 07/27/2015  . Asthma, moderate persistent 07/27/2015    Past  Surgical History:  Procedure Laterality Date  . ABDOMINAL HYSTERECTOMY  1991  . BILATERAL SALPINGOOPHORECTOMY    . CARPAL TUNNEL RELEASE Bilateral [id number]   [facility]  . CATARACT EXTRACTION W/PHACO Left 09/23/2015   Procedure: CATARACT EXTRACTION PHACO AND INTRAOCULAR LENS PLACEMENT (IOC);  Surgeon: Lockie Mola, MD;  Location: [facility] ORS;  Service: Ophthalmology;  Laterality: Left;  [place] 00:57 AP% 12.2 6.98 CDE Fluid pack lot # [id number]  . CATARACT EXTRACTION W/PHACO Right 10/17/2015   Procedure: CATARACT EXTRACTION PHACO AND INTRAOCULAR LENS PLACEMENT (IOC);  Surgeon: Lockie Mola, MD;  Location: [facility] ORS;  Service: Ophthalmology;  Laterality: Right;  [place]  00:30.5 AP   00:47.2 CDE  6.05 casette lot #[id number]  . DandC    . DILATION AND CURETTAGE OF UTERUS  1990  . HERNIA REPAIR  1992   ventral    Family History  Problem Relation Age of Onset  . Hypertension Mother   . Cancer Father        prostate  . CVA Father   . Depression Father   . Healthy Sister   . Cancer Brother        prostate    Social History   Socioeconomic History  . Marital status: Married    Spouse name: Maisie Fus  . Number of children: 4  . Years of education: Not on file  . Highest education level: Associate degree: occupational, Scientist, product/process development, or vocational program  Occupational History  . Occupation: Retired  Engineer, production  . Financial resource strain: Not hard at all  . Food insecurity    Worry: Never true    Inability: Never true  . Transportation needs    Medical: No    Non-medical: No  Tobacco Use  . Smoking status: Never Smoker  . Smokeless tobacco: Never Used  . Tobacco comment: smoking cessation materials not required  Substance and Sexual Activity  . Alcohol use: No    Alcohol/week: 0.0 standard drinks  . Drug use: No  . Sexual activity: Yes    Partners: Male  Lifestyle  . Physical activity    Days per week: 0 days    Minutes per session: 0 min  . Stress: Not at all   Relationships  . Social Musician on phone: Patient refused    Gets together: Patient refused    Attends religious service: Patient refused    Active member of club or organization: Patient refused    Attends meetings of clubs or organizations: Patient refused    Relationship status: Married  . Intimate partner violence    Fear of current or ex partner: No    Emotionally abused: No    Physically abused: No    Forced sexual activity: No  Other Topics Concern  . Not on file  Social History Narrative  . Not on file     Current Outpatient Medications:  .  acetaminophen (TYLENOL) 500 MG tablet, Take 1,000 mg by mouth at bedtime. , Disp: , Rfl:  .  albuterol (PROAIR HFA) 108 (90 Base) MCG/ACT inhaler, INHALE TWO PUFFS EVERY 6 HOURS AS NEEDEDWHEEZING, Disp: 8.5 g, Rfl: 1 .  BREO ELLIPTA 100-25 MCG/INH AEPB, TAKE 1 PUFF BY MOUTH EVERY DAY, Disp: 60 each, Rfl: 5 .  Cholecalciferol (VITAMIN D) 2000 UNITS tablet, Take 1 tablet by mouth daily. Bedtime, Disp: , Rfl:  .  DULoxetine (CYMBALTA) 60 MG capsule, TAKE 1 CAPSULE BY MOUTH EVERY DAY, Disp: 90 capsule, Rfl: 0 .  esomeprazole (NEXIUM) 40 MG capsule, TAKE 1 CAPSULE BY MOUTH EVERY DAY, Disp: 90 capsule, Rfl: 1 .  MAGNESIUM MALATE PO, Take 1 tablet by mouth daily., Disp: , Rfl:  .  meloxicam (MOBIC) 15 MG tablet, TAKE 1 TABLET BY MOUTH EVERY DAY, Disp: 90 tablet, Rfl: 0 .  Multiple Vitamins-Minerals (ICAPS AREDS 2) CAPS, Take 2 capsules by mouth daily. , Disp: , Rfl:  .  Probiotic Product (SOLUBLE FIBER/PROBIOTICS PO), Take 1 capsule by mouth daily., Disp: , Rfl:  .  vitamin B-12 (CYANOCOBALAMIN) 1000 MCG tablet, Take 1,000 mcg by mouth daily. Bedtime, Disp: , Rfl:   Allergies  Allergen Reactions  . Morphine   . Penicillins   . Sulfa Antibiotics     I personally reviewed active problem list, medication list, allergies, family history, social history, health maintenance with the patient/caregiver today.   ROS  Ten systems  reviewed and is negative except as mentioned in HPI   Objective  Virtual encounter, vitals done at home  Today's Vitals   08/09/19 0945 08/09/19 1035  Pulse:  72  Temp:  97.6 F (36.4 C)  SpO2:  98%  Weight:  286 lb (129.7 kg)  PainSc: 6     Body mass index is 50.66 kg/m.  Body mass index is 50.66 kg/m.  Physical Exam  Awake, alert and oriented   PHQ2/9: Depression screen [facility] 2/9 08/09/2019 11/29/2018 08/23/2018 03/14/2018 09/03/2017  Decreased Interest 0 1 3 1  0  Down, Depressed, Hopeless 1 0 3 0 0  PHQ - 2 Score 1 1 6 1  0  Altered sleeping 0 0 3 0 0  Tired, decreased energy 2 1 3 2 2   Change in appetite 0 0 3 0 0  Feeling bad or failure about yourself  0 1 2 0 0  Trouble concentrating 2 0 0 0 0  Moving slowly or fidgety/restless 0 0 0 0 0  Suicidal thoughts 0 0 0 0 -  PHQ-9 Score 5 3 17 3 2   Difficult doing work/chores Somewhat difficult Somewhat difficult Somewhat difficult Somewhat difficult Not difficult at all   PHQ-2/9 Result is positive.    Fall Risk: Fall Risk  08/09/2019 11/29/2018 08/23/2018 09/03/2017 03/02/2017  Falls in the past year? 0 0 No No No  Number falls in past yr: 0 0 - - -  Injury with Fall? 0 0 - - -  Risk for fall due to : - - Impaired vision;Impaired balance/gait;Medication side effect - -  Risk for fall due to: Comment - - wears eyeglasses; macular degeneration; avoids stairs; knee pain; ambulates with cane - -     Assessment & Plan  1. Asthma, moderate persistent, well-controlled  - fluticasone furoate-vilanterol (BREO ELLIPTA) 100-25 MCG/INH AEPB; Inhale 1 puff into the lungs daily.  Dispense: 60 each; Refill: 5  2. Primary osteoarthritis involving multiple joints  - meloxicam (MOBIC) 15 MG tablet; Take 1 tablet (15 mg total) by mouth daily.  Dispense: 90 tablet; Refill: 0  3. Morbid obesity (HCC)  Discussed with the patient the risk posed by an increased BMI. Discussed importance of portion control, calorie counting and at least  150 minutes of physical  activity weekly. Avoid sweet beverages and drink more water. Eat at least 6 servings of fruit and vegetables daily   4. Mild recurrent major depression (HCC)  - DULoxetine (CYMBALTA) 60 MG capsule; Take 1 capsule (60 mg total) by mouth daily.  Dispense: 90 capsule; Refill: 1  5. Peripheral polyneuropathy  stable  6. Vitamin D deficiency  Take otc supplements  7. Gastro-esophageal reflux disease without esophagitis  Doing well on medication   8. Hyperglycemia  Recheck labs next visit  9. Vitamin B12 deficiency   10. Senile purpura (HCC)  stable  11. Encounter for screening mammogram for breast cancer  - MM 3D SCREEN BREAST BILATERAL; Future  I discussed the assessment and treatment plan with the patient. The patient was provided an opportunity to ask questions and all were answered. The patient agreed with the plan and demonstrated an understanding of the instructions.  The patient was advised to call back or seek an in-person evaluation if the symptoms worsen or if the condition fails to improve as anticipated.  I provided 25  minutes of non-face-to-face time during this encounter.

## 2019-08-17 ENCOUNTER — Ambulatory Visit: Payer: Medicare Other

## 2019-08-17 ENCOUNTER — Other Ambulatory Visit: Payer: Self-pay

## 2019-08-17 ENCOUNTER — Ambulatory Visit (INDEPENDENT_AMBULATORY_CARE_PROVIDER_SITE_OTHER): Payer: Medicare Other

## 2019-08-17 DIAGNOSIS — Z23 Encounter for immunization: Secondary | ICD-10-CM | POA: Diagnosis not present

## 2019-08-25 ENCOUNTER — Ambulatory Visit: Payer: Medicare Other

## 2019-08-31 ENCOUNTER — Encounter: Payer: Self-pay | Admitting: Family Medicine

## 2019-09-01 ENCOUNTER — Other Ambulatory Visit: Payer: Self-pay | Admitting: Family Medicine

## 2019-09-01 ENCOUNTER — Encounter: Payer: Self-pay | Admitting: Family Medicine

## 2019-09-01 DIAGNOSIS — F330 Major depressive disorder, recurrent, mild: Secondary | ICD-10-CM

## 2019-09-01 MED ORDER — DULOXETINE HCL 40 MG PO CPEP: 40.0000 mg | ORAL_CAPSULE | Freq: Every day | ORAL | 0 refills | Status: DC

## 2019-09-04 ENCOUNTER — Other Ambulatory Visit: Payer: Self-pay | Admitting: Family Medicine

## 2019-09-04 DIAGNOSIS — F330 Major depressive disorder, recurrent, mild: Secondary | ICD-10-CM

## 2019-09-04 MED ORDER — DULOXETINE HCL 30 MG PO CPEP: 30.0000 mg | ORAL_CAPSULE | Freq: Every day | ORAL | 0 refills | Status: DC

## 2019-11-07 ENCOUNTER — Other Ambulatory Visit: Payer: Self-pay | Admitting: Family Medicine

## 2019-11-07 DIAGNOSIS — M159 Polyosteoarthritis, unspecified: Secondary | ICD-10-CM

## 2019-11-26 ENCOUNTER — Other Ambulatory Visit: Payer: Self-pay | Admitting: Family Medicine

## 2020-02-02 ENCOUNTER — Other Ambulatory Visit: Payer: Self-pay | Admitting: Family Medicine

## 2020-02-02 DIAGNOSIS — K219 Gastro-esophageal reflux disease without esophagitis: Secondary | ICD-10-CM

## 2020-02-05 ENCOUNTER — Encounter: Payer: Self-pay | Admitting: Family Medicine

## 2020-02-05 ENCOUNTER — Ambulatory Visit: Payer: Medicare Other | Admitting: Family Medicine

## 2020-02-05 ENCOUNTER — Other Ambulatory Visit: Payer: Self-pay

## 2020-02-05 DIAGNOSIS — D692 Other nonthrombocytopenic purpura: Secondary | ICD-10-CM

## 2020-02-05 DIAGNOSIS — F330 Major depressive disorder, recurrent, mild: Secondary | ICD-10-CM | POA: Diagnosis not present

## 2020-02-05 DIAGNOSIS — Z79899 Other long term (current) drug therapy: Secondary | ICD-10-CM | POA: Diagnosis not present

## 2020-02-05 DIAGNOSIS — E559 Vitamin D deficiency, unspecified: Secondary | ICD-10-CM | POA: Diagnosis not present

## 2020-02-05 DIAGNOSIS — E538 Deficiency of other specified B group vitamins: Secondary | ICD-10-CM

## 2020-02-05 DIAGNOSIS — J4540 Moderate persistent asthma, uncomplicated: Secondary | ICD-10-CM | POA: Diagnosis not present

## 2020-02-05 DIAGNOSIS — G629 Polyneuropathy, unspecified: Secondary | ICD-10-CM | POA: Diagnosis not present

## 2020-02-05 LAB — CBC WITH DIFFERENTIAL/PLATELET
Absolute Monocytes: 456 cells/uL (ref 200–950)
Basophils Absolute: 38 cells/uL (ref 0–200)
Basophils Relative: 0.5 %
Eosinophils Absolute: 129 cells/uL (ref 15–500)
Eosinophils Relative: 1.7 %
HCT: 40 % (ref 35.0–45.0)
Hemoglobin: 13.3 g/dL (ref 11.7–15.5)
Lymphs Abs: 2614 cells/uL (ref 850–3900)
MCH: 30.2 pg (ref 27.0–33.0)
MCHC: 33.3 g/dL (ref 32.0–36.0)
MCV: 90.7 fL (ref 80.0–100.0)
MPV: 11.3 fL (ref 7.5–12.5)
Monocytes Relative: 6 %
Neutro Abs: 4362 cells/uL (ref 1500–7800)
Neutrophils Relative %: 57.4 %
Platelets: 256 10*3/uL (ref 140–400)
RBC: 4.41 10*6/uL (ref 3.80–5.10)
RDW: 13.1 % (ref 11.0–15.0)
Total Lymphocyte: 34.4 %
WBC: 7.6 10*3/uL (ref 3.8–10.8)

## 2020-02-05 LAB — COMPLETE METABOLIC PANEL WITH GFR
AG Ratio: 1.5 (calc) (ref 1.0–2.5)
ALT: 10 U/L (ref 6–29)
AST: 16 U/L (ref 10–35)
Albumin: 4.1 g/dL (ref 3.6–5.1)
Alkaline phosphatase (APISO): 87 U/L (ref 37–153)
BUN: 21 mg/dL (ref 7–25)
CO2: 31 mmol/L (ref 20–32)
Calcium: 9.4 mg/dL (ref 8.6–10.4)
Chloride: 101 mmol/L (ref 98–110)
Creat: 0.77 mg/dL (ref 0.60–0.93)
GFR, Est African American: 89 mL/min/{1.73_m2} (ref >=60)
GFR, Est Non African American: 77 mL/min/{1.73_m2} (ref >=60)
Globulin: 2.8 g/dL (calc) (ref 1.9–3.7)
Glucose, Bld: 99 mg/dL (ref 65–99)
Potassium: 5.1 mmol/L (ref 3.5–5.3)
Sodium: 138 mmol/L (ref 135–146)
Total Bilirubin: 0.5 mg/dL (ref 0.2–1.2)
Total Protein: 6.9 g/dL (ref 6.1–8.1)

## 2020-02-05 MED ORDER — BREO ELLIPTA 100-25 MCG/INH IN AEPB: 1.0000 | INHALATION_SPRAY | Freq: Every day | RESPIRATORY_TRACT | 5 refills | Status: DC

## 2020-02-05 NOTE — Progress Notes (Signed)
Name: Christie Hanson   MRN: [id number]    DOB: 1946-06-07   Date:02/05/2020       Progress Note  Subjective  Chief Complaint  Chief Complaint  Patient presents with  . Medication Refill    6 month F/U  . Obesity  . Depression  . Meralgia paresthetica  . Asthma  . Gastroesophageal Reflux    HPI  Metabolic Syndrome:last hgbA1C was 5.4%, she denies polyphagia, polydipsia or polyuria. A1C has been normal for a long time and we will hold off checking labs today   Asthma Moderate:She is compliant with Breo.Shetakes loratadine prn only. She continues to have wheezing about twice a week, she also has a dry cough occasionally maybe once a week. Tolerating medication well. She has only used rescue inhaler twice week  GERD: taking Nexium daily,she has been unable to wean self off. No heart burn as long as she takes medications and not dietary indiscretion   Neuropathy: she states she has burning sensation on both feet for many years, last B12was normal but has history of b12 deficiency.She does not want to try Gabapentin or Lyrica at this time. She was on Duloxetine but stopped on her own.   Meralgia paresthetica: it was present for many years, however this Summer the pain was daily and very intense, burning like , right lateral thigh, not associated with weakness, she states pain is a 3-4/10 even without duloxetine   Depression: she stopped taking all medication back in 2017.Took Prozac and Abilify for many yearsprior to that. She stopped because of side effects, like tremors Tried cymbalta but it made her feel "weird" and stopped medication, it made her feel anxious, however decided to go back on medication in 07/2018 because of meralgia paresthetica was  worsening  of depression last fall, also helps with meralgia paresthetica but caused mental fogginess she stopped taking it and does not want to try anything else  Obesity: she lost 14 lbs since last year, she has changed  her diet, drinking more water, eating more fruit and vegetables.   Vitamin D and Vitamin B12 deficiency: back to normal still on supplementation. We will hold off on labs   OA: she tried weaning self offMeloxicamand pain got much worse,since pandemic she has been less active and pain on both knees, she was seeing Dr. Thurston Hole, but he is no longer coming to [facility], so she is now seeing PA at [facility] and is trying to lose weight, now on Celebrex pain seems to be the same   Senile purpura: both arms, stable.   Patient Active Problem List   Diagnosis Date Noted  . Senile purpura (HCC) 03/14/2018  . Age-related macular degeneration 03/14/2018  . Morbid obesity (HCC) 03/14/2018  . Peripheral polyneuropathy 03/02/2017  . Vitamin B12 deficiency 07/29/2015  . Carpal tunnel syndrome 07/27/2015  . Osteoarthritis 07/27/2015  . Depression, major, recurrent, mild (HCC) 07/27/2015  . Gastro-esophageal reflux disease without esophagitis 07/27/2015  . Displacement of cervical intervertebral disc without myelopathy 07/27/2015  . Blood glucose elevated 07/27/2015  . Dysmetabolic syndrome 07/27/2015  . Vitamin D deficiency 07/27/2015  . Sprain of rotator cuff capsule 07/27/2015  . Urgency of urination 07/27/2015  . Allergic rhinitis 07/27/2015  . Asthma, moderate persistent 07/27/2015    Past Surgical History:  Procedure Laterality Date  . ABDOMINAL HYSTERECTOMY  1991  . BILATERAL SALPINGOOPHORECTOMY    . CARPAL TUNNEL RELEASE Bilateral [id number]   [facility]  . CATARACT EXTRACTION W/PHACO Left 09/23/2015   Procedure:  CATARACT EXTRACTION PHACO AND INTRAOCULAR LENS PLACEMENT (IOC);  Surgeon: Leandrew Koyanagi, MD;  Location: [facility] ORS;  Service: Ophthalmology;  Laterality: Left;  [place] 00:57 AP% 12.2 6.98 CDE Fluid pack lot # [id number]  . CATARACT EXTRACTION W/PHACO Right 10/17/2015   Procedure: CATARACT EXTRACTION PHACO AND INTRAOCULAR LENS PLACEMENT (IOC);  Surgeon: Leandrew Koyanagi, MD;   Location: [facility] ORS;  Service: Ophthalmology;  Laterality: Right;  [place]  00:30.5 AP   00:47.2 CDE  6.05 casette lot #[id number]  . DandC    . DILATION AND CURETTAGE OF UTERUS  1990  . HERNIA REPAIR  1992   ventral    Family History  Problem Relation Age of Onset  . Hypertension Mother   . Cancer Father        prostate  . CVA Father   . Depression Father   . Healthy Sister   . Cancer Brother        prostate    Social History   Tobacco Use  . Smoking status: Never Smoker  . Smokeless tobacco: Never Used  . Tobacco comment: smoking cessation materials not required  Substance Use Topics  . Alcohol use: No    Alcohol/week: 0.0 standard drinks     Current Outpatient Medications:  .  acetaminophen (TYLENOL) 500 MG tablet, Take 1,000 mg by mouth at bedtime. , Disp: , Rfl:  .  albuterol (PROAIR HFA) 108 (90 Base) MCG/ACT inhaler, INHALE TWO PUFFS EVERY 6 HOURS AS NEEDEDWHEEZING, Disp: 8.5 g, Rfl: 1 .  celecoxib (CELEBREX) 200 MG capsule, , Disp: , Rfl:  .  Cholecalciferol (VITAMIN D) 2000 UNITS tablet, Take 1 tablet by mouth daily. Bedtime, Disp: , Rfl:  .  esomeprazole (NEXIUM) 40 MG capsule, TAKE 1 CAPSULE BY MOUTH EVERY DAY, Disp: 90 capsule, Rfl: 1 .  fluticasone furoate-vilanterol (BREO ELLIPTA) 100-25 MCG/INH AEPB, Inhale 1 puff into the lungs daily., Disp: 60 each, Rfl: 5 .  MAGNESIUM MALATE PO, Take 1 tablet by mouth daily., Disp: , Rfl:  .  Multiple Vitamins-Minerals (ICAPS AREDS 2 PO), Take by mouth., Disp: , Rfl:  .  Multiple Vitamins-Minerals (ICAPS AREDS 2) CAPS, Take 2 capsules by mouth daily. , Disp: , Rfl:  .  Probiotic Product (SOLUBLE FIBER/PROBIOTICS PO), Take 1 capsule by mouth daily., Disp: , Rfl:  .  Probiotic Product (UP4 PROBIOTICS WOMENS PO), Take by mouth., Disp: , Rfl:  .  vitamin B-12 (CYANOCOBALAMIN) 1000 MCG tablet, Take 1,000 mcg by mouth daily. Bedtime, Disp: , Rfl:  .  DULoxetine (CYMBALTA) 30 MG capsule, TAKE 1 CAPSULE BY MOUTH EVERY DAY (Patient  not taking: Reported on 02/05/2020), Disp: 90 capsule, Rfl: 0 .  meloxicam (MOBIC) 15 MG tablet, TAKE 1 TABLET BY MOUTH EVERY DAY (Patient not taking: Reported on 02/05/2020), Disp: 90 tablet, Rfl: 0  Allergies  Allergen Reactions  . Morphine   . Penicillins   . Sulfa Antibiotics     I personally reviewed active problem list, medication list, allergies, family history, social history, health maintenance with the patient/caregiver today.   ROS  Constitutional: Negative for fever, positive for weight change.  Respiratory: Positive  for cough but no  shortness of breath.   Cardiovascular: Negative for chest pain or palpitations.  Gastrointestinal: Negative for abdominal pain, no bowel changes.  Musculoskeletal: Positive  for gait problem but no  joint swelling.  Skin: Negative for rash.  Neurological: Negative for dizziness or headache.  No other specific complaints in a complete review of systems (except as listed in  HPI above).  Objective  Vitals:   02/05/20 1128  BP: 124/82  Pulse: 81  Resp: 16  Temp: 97.6 F (36.4 C)  TempSrc: Temporal  SpO2: 97%  Weight: 279 lb 15.4 oz (127 kg)  Height: 5\' 3"  (1.6 m)    Body mass index is 49.59 kg/m.  Physical Exam  Constitutional: Patient appears well-developed and well-nourished. Obese  No distress.  HEENT: head atraumatic, normocephalic, pupils equal and reactive to light Cardiovascular: Normal rate, regular rhythm and normal heart sounds.  No murmur heard. No BLE edema. Pulmonary/Chest: Effort normal and breath sounds normal. No respiratory distress. Abdominal: Soft.  There is no tenderness. Muscular skeletal: positive for crepitus on extension of both knees  Psychiatric: Patient has a normal mood and affect. behavior is normal. Judgment and thought content normal.  PHQ2/9: Depression screen [facility] 2/9 02/05/2020 08/09/2019 11/29/2018 08/23/2018 03/14/2018  Decreased Interest 0 0 1 3 1   Down, Depressed, Hopeless 0 1 0 3 0  PHQ - 2  Score 0 1 1 6 1   Altered sleeping 0 0 0 3 0  Tired, decreased energy 1 2 1 3 2   Change in appetite 0 0 0 3 0  Feeling bad or failure about yourself  0 0 1 2 0  Trouble concentrating 0 2 0 0 0  Moving slowly or fidgety/restless 0 0 0 0 0  Suicidal thoughts 0 0 0 0 0  PHQ-9 Score 1 5 3 17 3   Difficult doing work/chores Somewhat difficult Somewhat difficult Somewhat difficult Somewhat difficult Somewhat difficult    phq 9 is negative   Fall Risk: Fall Risk  02/05/2020 08/09/2019 11/29/2018 08/23/2018 09/03/2017  Falls in the past year? 0 0 0 No No  Number falls in past yr: 0 0 0 - -  Injury with Fall? 0 0 0 - -  Risk for fall due to : - - - Impaired vision;Impaired balance/gait;Medication side effect -  Risk for fall due to: Comment - - - wears eyeglasses; macular degeneration; avoids stairs; knee pain; ambulates with cane -    Functional Status Survey: Is the patient deaf or have difficulty hearing?: No Does the patient have difficulty seeing, even when wearing glasses/contacts?: Yes Does the patient have difficulty concentrating, remembering, or making decisions?: No Does the patient have difficulty walking or climbing stairs?: No Does the patient have difficulty dressing or bathing?: No Does the patient have difficulty doing errands alone such as visiting a doctor's office or shopping?: No   Assessment & Plan  1. Morbid obesity (HCC)  She is doing good   2. Vitamin D deficiency  Continue supplementation   3. Asthma, moderate persistent, well-controlled  - fluticasone furoate-vilanterol (BREO ELLIPTA) 100-25 MCG/INH AEPB; Inhale 1 puff into the lungs daily.  Dispense: 60 each; Refill: 5  4. Vitamin B12 deficiency  - CBC with Differential/Platelet  5. Senile purpura (HCC)  Stable   6. Depression, major, recurrent, mild (HCC)  Off medication  7. Peripheral polyneuropathy   8. Long-term use of high-risk medication  - COMPLETE METABOLIC PANEL WITH GFR - CBC with  Differential/Platelet

## 2020-02-15 DIAGNOSIS — M1711 Unilateral primary osteoarthritis, right knee: Secondary | ICD-10-CM | POA: Diagnosis not present

## 2020-02-15 DIAGNOSIS — M1712 Unilateral primary osteoarthritis, left knee: Secondary | ICD-10-CM | POA: Diagnosis not present

## 2020-02-23 DIAGNOSIS — Z6841 Body Mass Index (BMI) 40.0 and over, adult: Secondary | ICD-10-CM | POA: Insufficient documentation

## 2020-02-23 DIAGNOSIS — M170 Bilateral primary osteoarthritis of knee: Secondary | ICD-10-CM | POA: Diagnosis not present

## 2020-04-18 DIAGNOSIS — H353132 Nonexudative age-related macular degeneration, bilateral, intermediate dry stage: Secondary | ICD-10-CM | POA: Diagnosis not present

## 2020-08-03 ENCOUNTER — Other Ambulatory Visit: Payer: Self-pay | Admitting: Family Medicine

## 2020-08-03 DIAGNOSIS — K219 Gastro-esophageal reflux disease without esophagitis: Secondary | ICD-10-CM

## 2020-08-06 NOTE — Progress Notes (Addendum)
Name: Christie Hanson   MRN: [id number]    DOB: 1946/09/05   Date:08/07/2020       Progress Note  Subjective  Chief Complaint  Chief Complaint  Patient presents with  . Follow-up  . Depression  . Obesity    HPI   Metabolic Syndrome:last hgbA1C was 5.4%, she denies polyphagia, polydipsia or polyuria. A1C has been stable, we will recheck it today   Asthma Moderate:She is compliant with Breo.Shetakes loratadine prn only. She has occasional cough but no wheezing lately  Tolerating medication well. She has only used her rescue inhaler once in the past 6 months   GERD: taking Nexium daily,she has been unable to wean self off. She states over the past couple of weeks symptoms has gotten worse. She has been under more stress taking care of her sister that had a hip replacement in July and had some complications and she was the primary caregiver. She states she had stopped NSAID's because of diarrhea and discomfort about one month ago, the diarrhea resolved, but states currently having to eat every 2-3 hours otherwise pain on epigastric area hurts - described as burning sensation. She lost some weight but states drinking more water, she stopped drinking coffee, off nsaid's Discussed having EGD. She states symptoms gradually improving, we will try adding H2blockers . Her daughter is a physician and suggested Carafate   Neuropathy: she states she has burning sensation on both feet for many years, she is on B12 supplementation, she has lost weight and has noticed improvement of pain / burning on her feet   Meralgia paresthetica: it was present for many years, however this Summer the pain was daily and very intense, burning like , right lateral thigh, not associated with weakness, she states pain is a 3-4/10 even without duloxetine   Depression: she stopped taking all medication back in 2017.Took Prozac and Abilify for many yearsprior to that. She stopped because of side effects, like  tremors Tried cymbalta but it made her feel "weird" and stopped medication, it made her feel anxious, however decided to go back on medication in 07/2018 because of meralgia paresthetica , she stopped all medications again in 2020 again, she states she is snappy at times, stressed with her sister but does not want to go back on medication  Obesity: she lost 10  lbs since last visit , she has changed her diet, drinking more water, eating more fruit and vegetables.   Vitamin D and Vitamin B12 deficiency: back to normal still on supplementation. We will check B12 but not vitamin D because of cost   OA: she  has been less active and pain on both knees, currently seeing [facility] Landry Mellow [facility], she had hyaluronic acid injection and states doing better, on tylenol only for pain, off nsaid's because of GI symptoms Pain is described is dull, sometimes sharp but currently pain is zero. Aggravated by walking or standing   Senile purpura: both arms, stable. Reassurance given     Patient Active Problem List   Diagnosis Date Noted  . BMI 45.0-49.9, adult (HCC) 02/23/2020  . Senile purpura (HCC) 03/14/2018  . Age-related macular degeneration 03/14/2018  . Morbid obesity (HCC) 03/14/2018  . Peripheral polyneuropathy 03/02/2017  . Vitamin B12 deficiency 07/29/2015  . Carpal tunnel syndrome 07/27/2015  . Osteoarthritis 07/27/2015  . Depression, major, recurrent, mild (HCC) 07/27/2015  . Gastro-esophageal reflux disease without esophagitis 07/27/2015  . Displacement of cervical intervertebral disc without myelopathy 07/27/2015  . Blood glucose  elevated 07/27/2015  . Dysmetabolic syndrome 07/27/2015  . Vitamin D deficiency 07/27/2015  . Sprain of rotator cuff capsule 07/27/2015  . Urgency of urination 07/27/2015  . Allergic rhinitis 07/27/2015  . Asthma, moderate persistent 07/27/2015    Past Surgical History:  Procedure Laterality Date  . ABDOMINAL HYSTERECTOMY  1991  . BILATERAL  SALPINGOOPHORECTOMY    . CARPAL TUNNEL RELEASE Bilateral [id number]   [facility]  . CATARACT EXTRACTION W/PHACO Left 09/23/2015   Procedure: CATARACT EXTRACTION PHACO AND INTRAOCULAR LENS PLACEMENT (IOC);  Surgeon: Lockie Molahadwick Brasington, MD;  Location: [facility] ORS;  Service: Ophthalmology;  Laterality: Left;  [place] 00:57 AP% 12.2 6.98 CDE Fluid pack lot # [id number]  . CATARACT EXTRACTION W/PHACO Right 10/17/2015   Procedure: CATARACT EXTRACTION PHACO AND INTRAOCULAR LENS PLACEMENT (IOC);  Surgeon: Lockie Molahadwick Brasington, MD;  Location: [facility] ORS;  Service: Ophthalmology;  Laterality: Right;  [place]  00:30.5 AP   00:47.2 CDE  6.05 casette lot #[id number]  . DandC    . DILATION AND CURETTAGE OF UTERUS  1990  . HERNIA REPAIR  1992   ventral    Family History  Problem Relation Age of Onset  . Hypertension Mother   . Cancer Father        prostate  . CVA Father   . Depression Father   . Healthy Sister   . Cancer Brother        prostate    Social History   Tobacco Use  . Smoking status: Never Smoker  . Smokeless tobacco: Never Used  . Tobacco comment: smoking cessation materials not required  Substance Use Topics  . Alcohol use: No    Alcohol/week: 0.0 standard drinks     Current Outpatient Medications:  .  acetaminophen (TYLENOL) 500 MG tablet, Take 1,000 mg by mouth at bedtime. , Disp: , Rfl:  .  albuterol (PROAIR HFA) 108 (90 Base) MCG/ACT inhaler, INHALE TWO PUFFS EVERY 6 HOURS AS NEEDEDWHEEZING, Disp: 8.5 g, Rfl: 1 .  Cholecalciferol (VITAMIN D) 2000 UNITS tablet, Take 1 tablet by mouth daily. Bedtime, Disp: , Rfl:  .  esomeprazole (NEXIUM) 40 MG capsule, TAKE 1 CAPSULE BY MOUTH EVERY DAY, Disp: 90 capsule, Rfl: 1 .  fluticasone furoate-vilanterol (BREO ELLIPTA) 100-25 MCG/INH AEPB, Inhale 1 puff into the lungs daily., Disp: 60 each, Rfl: 5 .  Multiple Vitamins-Minerals (ICAPS AREDS 2) CAPS, Take 2 capsules by mouth daily. , Disp: , Rfl:  .  Probiotic Product (UP4 PROBIOTICS WOMENS PO), Take by  mouth., Disp: , Rfl:  .  vitamin B-12 (CYANOCOBALAMIN) 1000 MCG tablet, Take 1,000 mcg by mouth daily. Bedtime, Disp: , Rfl:  .  celecoxib (CELEBREX) 200 MG capsule, Take 1 capsule by mouth daily. (Patient not taking: Reported on 08/07/2020), Disp: , Rfl:   Allergies  Allergen Reactions  . Duloxetine     Mental fogginess   . Morphine   . Penicillins   . Sulfa Antibiotics     I personally reviewed active problem list, medication list, allergies, family history, social history, health maintenance with the patient/caregiver today.   ROS  Constitutional: Negative for fever or weight change.  Respiratory: Negative for cough and shortness of breath.   Cardiovascular: Negative for chest pain or palpitations.  Gastrointestinal: positive  For epigastric  abdominal pain, but no bowel changes.  Musculoskeletal: positive  for gait problem and intermittent  joint swelling.  Skin: Negative for rash.  Neurological: Negative for dizziness or headache.  No other specific complaints in a complete review of systems (  except as listed in HPI above).  Objective  Vitals:   08/07/20 1055  BP: 128/80  Pulse: 85  Resp: 18  Temp: 98 F (36.7 C)  TempSrc: Oral  SpO2: 98%  Weight: 272 lb 3.2 oz (123.5 kg)  Height: 5\' 4"  (1.626 m)    Body mass index is 46.72 kg/m.  Physical Exam  Constitutional: Patient appears well-developed and well-nourished. Obese  No distress.  HEENT: head atraumatic, normocephalic, pupils equal and reactive to light, , neck supple Cardiovascular: Normal rate, regular rhythm and normal heart sounds.  No murmur heard. Non pitting  BLE edema. Pulmonary/Chest: Effort normal and breath sounds normal. No respiratory distress. Abdominal: Soft.  There is no tenderness. Muscular Skeletal: crepitus with extension on both knees  Psychiatric: Patient has a normal mood and affect. behavior is normal. Judgment and thought content normal.  PHQ2/9: Depression screen [facility] 2/9 08/07/2020  02/05/2020 08/09/2019 11/29/2018 08/23/2018  Decreased Interest 1 0 0 1 3  Down, Depressed, Hopeless 0 0 1 0 3  PHQ - 2 Score 1 0 1 1 6   Altered sleeping 0 0 0 0 3  Tired, decreased energy 1 1 2 1 3   Change in appetite 0 0 0 0 3  Feeling bad or failure about yourself  1 0 0 1 2  Trouble concentrating 0 0 2 0 0  Moving slowly or fidgety/restless 0 0 0 0 0  Suicidal thoughts 0 0 0 0 0  PHQ-9 Score 3 1 5 3 17   Difficult doing work/chores Not difficult at all Somewhat difficult Somewhat difficult Somewhat difficult Somewhat difficult    phq 9 is positive   Fall Risk: Fall Risk  08/07/2020 02/05/2020 08/09/2019 11/29/2018 08/23/2018  Falls in the past year? 0 0 0 0 No  Number falls in past yr: 0 0 0 0 -  Injury with Fall? 0 0 0 0 -  Risk for fall due to : - - - - Impaired vision;Impaired balance/gait;Medication side effect  Risk for fall due to: Comment - - - - wears eyeglasses; macular degeneration; avoids stairs; knee pain; ambulates with cane     Functional Status Survey: Is the patient deaf or have difficulty hearing?: No Does the patient have difficulty seeing, even when wearing glasses/contacts?: No Does the patient have difficulty concentrating, remembering, or making decisions?: No Does the patient have difficulty walking or climbing stairs?: Yes Does the patient have difficulty dressing or bathing?: No Does the patient have difficulty doing errands alone such as visiting a doctor's office or shopping?: No   Assessment & Plan  1. Asthma, moderate persistent, well-controlled  - fluticasone furoate-vilanterol (BREO ELLIPTA) 100-25 MCG/INH AEPB; Inhale 1 puff into the lungs daily.  Dispense: 60 each; Refill: 5  2. Depression, major, recurrent, mild (HCC)  She will contact me if she has worsening of symptoms this Fall, she has a history of getting worse in the winter months   3. Morbid obesity (HCC)  Discussed with the patient the risk posed by an increased BMI. Discussed  importance of portion control, calorie counting and at least 150 minutes of physical activity weekly. Avoid sweet beverages and drink more water. Eat at least 6 servings of fruit and vegetables daily   4. Vitamin B12 deficiency  - Vitamin B12  5. Vitamin D deficiency   6. Senile purpura (HCC)  Reassurance given  7. Peripheral polyneuropathy   8. Gastro-esophageal reflux disease without esophagitis  - famotidine (PEPCID) 40 MG tablet; Take 1 tablet (40 mg  total) by mouth daily.  Dispense: 90 tablet; Refill: 1 And carafate   9. Primary osteoarthritis involving multiple joints   10. Hyperglycemia  - Hemoglobin A1c  11. Long-term use of high-risk medication  - COMPLETE METABOLIC PANEL WITH GFR - CBC with Differential/Platelet  12. Dyspepsia  - H. pylori breath test

## 2020-08-07 ENCOUNTER — Ambulatory Visit: Payer: Medicare PPO | Admitting: Family Medicine

## 2020-08-07 ENCOUNTER — Other Ambulatory Visit: Payer: Self-pay

## 2020-08-07 ENCOUNTER — Encounter: Payer: Self-pay | Admitting: Family Medicine

## 2020-08-07 VITALS — BP 128/80 | HR 85 | Temp 98.0°F | Resp 18 | Ht 64.0 in | Wt 272.2 lb

## 2020-08-07 DIAGNOSIS — R1013 Epigastric pain: Secondary | ICD-10-CM | POA: Diagnosis not present

## 2020-08-07 DIAGNOSIS — R739 Hyperglycemia, unspecified: Secondary | ICD-10-CM | POA: Diagnosis not present

## 2020-08-07 DIAGNOSIS — J4540 Moderate persistent asthma, uncomplicated: Secondary | ICD-10-CM

## 2020-08-07 DIAGNOSIS — G629 Polyneuropathy, unspecified: Secondary | ICD-10-CM

## 2020-08-07 DIAGNOSIS — E559 Vitamin D deficiency, unspecified: Secondary | ICD-10-CM | POA: Diagnosis not present

## 2020-08-07 DIAGNOSIS — D692 Other nonthrombocytopenic purpura: Secondary | ICD-10-CM | POA: Diagnosis not present

## 2020-08-07 DIAGNOSIS — Z79899 Other long term (current) drug therapy: Secondary | ICD-10-CM

## 2020-08-07 DIAGNOSIS — M8949 Other hypertrophic osteoarthropathy, multiple sites: Secondary | ICD-10-CM

## 2020-08-07 DIAGNOSIS — E538 Deficiency of other specified B group vitamins: Secondary | ICD-10-CM

## 2020-08-07 DIAGNOSIS — K219 Gastro-esophageal reflux disease without esophagitis: Secondary | ICD-10-CM | POA: Diagnosis not present

## 2020-08-07 DIAGNOSIS — F330 Major depressive disorder, recurrent, mild: Secondary | ICD-10-CM

## 2020-08-07 DIAGNOSIS — M159 Polyosteoarthritis, unspecified: Secondary | ICD-10-CM

## 2020-08-07 MED ORDER — SUCRALFATE 1 G PO TABS: 1.0000 g | ORAL_TABLET | Freq: Three times a day (TID) | ORAL | 0 refills | Status: DC

## 2020-08-07 MED ORDER — BREO ELLIPTA 100-25 MCG/INH IN AEPB: 1.0000 | INHALATION_SPRAY | Freq: Every day | RESPIRATORY_TRACT | 5 refills | Status: DC

## 2020-08-07 MED ORDER — FAMOTIDINE 40 MG PO TABS: 40.0000 mg | ORAL_TABLET | Freq: Every day | ORAL | 1 refills | Status: DC

## 2020-08-07 NOTE — Addendum Note (Signed)
Addended by: Alba Cory F on: 08/07/2020 11:32 AM   Modules accepted: Orders

## 2020-08-08 LAB — CBC WITH DIFFERENTIAL/PLATELET
Absolute Monocytes: 542 cells/uL (ref 200–950)
Basophils Absolute: 48 cells/uL (ref 0–200)
Basophils Relative: 0.5 %
Eosinophils Absolute: 133 cells/uL (ref 15–500)
Eosinophils Relative: 1.4 %
HCT: 40.5 % (ref 35.0–45.0)
Hemoglobin: 13.5 g/dL (ref 11.7–15.5)
Lymphs Abs: 2793 cells/uL (ref 850–3900)
MCH: 30.2 pg (ref 27.0–33.0)
MCHC: 33.3 g/dL (ref 32.0–36.0)
MCV: 90.6 fL (ref 80.0–100.0)
MPV: 11.3 fL (ref 7.5–12.5)
Monocytes Relative: 5.7 %
Neutro Abs: 5985 cells/uL (ref 1500–7800)
Neutrophils Relative %: 63 %
Platelets: 281 10*3/uL (ref 140–400)
RBC: 4.47 10*6/uL (ref 3.80–5.10)
RDW: 13.6 % (ref 11.0–15.0)
Total Lymphocyte: 29.4 %
WBC: 9.5 10*3/uL (ref 3.8–10.8)

## 2020-08-08 LAB — COMPLETE METABOLIC PANEL WITH GFR
AG Ratio: 1.6 (calc) (ref 1.0–2.5)
ALT: 9 U/L (ref 6–29)
AST: 15 U/L (ref 10–35)
Albumin: 4.3 g/dL (ref 3.6–5.1)
Alkaline phosphatase (APISO): 86 U/L (ref 37–153)
BUN: 17 mg/dL (ref 7–25)
CO2: 30 mmol/L (ref 20–32)
Calcium: 9.5 mg/dL (ref 8.6–10.4)
Chloride: 100 mmol/L (ref 98–110)
Creat: 0.74 mg/dL (ref 0.60–0.93)
GFR, Est African American: 92 mL/min/{1.73_m2} (ref >=60)
GFR, Est Non African American: 80 mL/min/{1.73_m2} (ref >=60)
Globulin: 2.7 g/dL (calc) (ref 1.9–3.7)
Glucose, Bld: 92 mg/dL (ref 65–99)
Potassium: 5.1 mmol/L (ref 3.5–5.3)
Sodium: 139 mmol/L (ref 135–146)
Total Bilirubin: 0.5 mg/dL (ref 0.2–1.2)
Total Protein: 7 g/dL (ref 6.1–8.1)

## 2020-08-08 LAB — HEMOGLOBIN A1C
Hgb A1c MFr Bld: 5.5 % of total Hgb (ref <5.7)
Mean Plasma Glucose: 111 (calc)
eAG (mmol/L): 6.2 (calc)

## 2020-08-08 LAB — H. PYLORI BREATH TEST: H. pylori Breath Test: NOT DETECTED

## 2020-08-08 LAB — VITAMIN B12: Vitamin B-12: 635 pg/mL (ref 200–1100)

## 2020-08-27 DIAGNOSIS — M25561 Pain in right knee: Secondary | ICD-10-CM | POA: Diagnosis not present

## 2020-08-27 DIAGNOSIS — M25562 Pain in left knee: Secondary | ICD-10-CM | POA: Diagnosis not present

## 2020-08-27 DIAGNOSIS — G8929 Other chronic pain: Secondary | ICD-10-CM | POA: Diagnosis not present

## 2020-08-27 DIAGNOSIS — M170 Bilateral primary osteoarthritis of knee: Secondary | ICD-10-CM | POA: Diagnosis not present

## 2020-10-03 ENCOUNTER — Other Ambulatory Visit: Payer: Self-pay | Admitting: Family Medicine

## 2020-10-03 DIAGNOSIS — Z1231 Encounter for screening mammogram for malignant neoplasm of breast: Secondary | ICD-10-CM

## 2020-10-15 DIAGNOSIS — H353132 Nonexudative age-related macular degeneration, bilateral, intermediate dry stage: Secondary | ICD-10-CM | POA: Diagnosis not present

## 2021-01-27 ENCOUNTER — Other Ambulatory Visit: Payer: Self-pay | Admitting: Family Medicine

## 2021-01-27 DIAGNOSIS — K219 Gastro-esophageal reflux disease without esophagitis: Secondary | ICD-10-CM

## 2021-02-03 NOTE — Progress Notes (Signed)
Name: Christie Hanson   MRN: [id number]    DOB: 04-24-1946   Date:02/04/2021       Progress Note  Subjective  Chief Complaint  Follow Up  HPI  Metabolic Syndrome:last hgbA1C was 5.5%, she denies polyphagia, polydipsia or polyuria.   Asthma Moderate:She is compliant with Breo daily .Shetakes loratadine prn only because it causes her to feel dried out, using saline spray.  She has occasional cough but no wheezing lately  Tolerating medication well. She has only used rescue inhaler about twice in the past 6 months   GERD: she was having severe symptoms about 7-8 months ago, she was given Nexium and carafate for about 6 weeks, she also cut down on caffeine and spicy food and symptoms improved, but he still needs to be very careful about her diet. She is still taking Nexium. She had a flare about one month ago because she was stressed and ate a lot of chocolate, but is back to baseline now.   Neuropathy: she states she has burning sensation on both feet for many years, she is on B12 supplementation, she states symptoms have been under control   Meralgia paresthetica: it was present for many years, however this Summer the pain was daily and very intense, burning like , right lateral thigh, not associated with weakness, she states pain is a 3-4/10 even without duloxetine   Depression: she stopped taking all medication back in 2017.Took Prozac and Abilify for many yearsprior to that. She stopped because of side effects, like tremors Tried cymbalta but it made her feel "weird" and stopped medication, it made her feel anxious, however decided to go back on medication in 07/2018 because of meralgia paresthetica , she stopped all medications again in 2020 again, she states she is snappy at times but not daily. She feels stressed but no changes and does not want to go back on medication   Obesity: she had  lost 10  lbs before last visit, lost one pound since last visit  , she has changed her  diet, drinking more water, eating more fruit and vegetables. Doing well   Vitamin D and Vitamin B12 deficiency: back to normal still on supplementation. We will check B12 but not vitamin D because of cost   OA: she  has been less active and pain on both knees, currently seeing Dr. Landry Mellow at [facility], she had hyaluronic acid injections and helped a little initially, but has been off NSAID's and has been taking Tylenol BID  Senile purpura: both arms, reassurance.    Patient Active Problem List   Diagnosis Date Noted  . BMI 45.0-49.9, adult (HCC) 02/23/2020  . Senile purpura (HCC) 03/14/2018  . Age-related macular degeneration 03/14/2018  . Morbid obesity (HCC) 03/14/2018  . Peripheral polyneuropathy 03/02/2017  . Vitamin B12 deficiency 07/29/2015  . Carpal tunnel syndrome 07/27/2015  . Osteoarthritis 07/27/2015  . Depression, major, recurrent, mild (HCC) 07/27/2015  . Gastro-esophageal reflux disease without esophagitis 07/27/2015  . Displacement of cervical intervertebral disc without myelopathy 07/27/2015  . Blood glucose elevated 07/27/2015  . Dysmetabolic syndrome 07/27/2015  . Vitamin D deficiency 07/27/2015  . Sprain of rotator cuff capsule 07/27/2015  . Urgency of urination 07/27/2015  . Allergic rhinitis 07/27/2015  . Asthma, moderate persistent 07/27/2015    Past Surgical History:  Procedure Laterality Date  . ABDOMINAL HYSTERECTOMY  1991  . BILATERAL SALPINGOOPHORECTOMY    . CARPAL TUNNEL RELEASE Bilateral [id number]   [facility]  . CATARACT EXTRACTION W/PHACO Left  09/23/2015   Procedure: CATARACT EXTRACTION PHACO AND INTRAOCULAR LENS PLACEMENT (IOC);  Surgeon: Lockie Mola, MD;  Location: [facility] ORS;  Service: Ophthalmology;  Laterality: Left;  [place] 00:57 AP% 12.2 6.98 CDE Fluid pack lot # [id number]  . CATARACT EXTRACTION W/PHACO Right 10/17/2015   Procedure: CATARACT EXTRACTION PHACO AND INTRAOCULAR LENS PLACEMENT (IOC);  Surgeon: Lockie Mola, MD;   Location: [facility] ORS;  Service: Ophthalmology;  Laterality: Right;  [place]  00:30.5 AP   00:47.2 CDE  6.05 casette lot #[id number]  . DandC    . DILATION AND CURETTAGE OF UTERUS  1990  . HERNIA REPAIR  1992   ventral    Family History  Problem Relation Age of Onset  . Hypertension Mother   . Cancer Father        prostate  . CVA Father   . Depression Father   . Healthy Sister   . Cancer Brother        prostate    Social History   Tobacco Use  . Smoking status: Never Smoker  . Smokeless tobacco: Never Used  . Tobacco comment: smoking cessation materials not required  Substance Use Topics  . Alcohol use: No    Alcohol/week: 0.0 standard drinks     Current Outpatient Medications:  .  acetaminophen (TYLENOL) 500 MG tablet, Take 1,000 mg by mouth daily., Disp: , Rfl:  .  albuterol (PROAIR HFA) 108 (90 Base) MCG/ACT inhaler, INHALE TWO PUFFS EVERY 6 HOURS AS NEEDEDWHEEZING, Disp: 8.5 g, Rfl: 1 .  Cholecalciferol (VITAMIN D) 2000 UNITS tablet, Take 1 tablet by mouth daily. Bedtime, Disp: , Rfl:  .  esomeprazole (NEXIUM) 40 MG capsule, TAKE 1 CAPSULE BY MOUTH EVERY DAY, Disp: 90 capsule, Rfl: 0 .  fluticasone furoate-vilanterol (BREO ELLIPTA) 100-25 MCG/INH AEPB, Inhale 1 puff into the lungs daily., Disp: 60 each, Rfl: 5 .  Multiple Vitamins-Minerals (ICAPS AREDS 2) CAPS, Take 2 capsules by mouth daily. , Disp: , Rfl:  .  Probiotic Product (UP4 PROBIOTICS WOMENS PO), Take by mouth., Disp: , Rfl:  .  sucralfate (CARAFATE) 1 g tablet, Take 1 tablet (1 g total) by mouth 4 (four) times daily -  with meals and at bedtime. (Patient taking differently: Take 1 g by mouth as needed.), Disp: 120 tablet, Rfl: 0 .  vitamin B-12 (CYANOCOBALAMIN) 1000 MCG tablet, Take 1,000 mcg by mouth daily. Bedtime, Disp: , Rfl:  .  famotidine (PEPCID) 40 MG tablet, Take 1 tablet (40 mg total) by mouth daily. (Patient not taking: Reported on 02/04/2021), Disp: 90 tablet, Rfl: 1  Allergies  Allergen Reactions  .  Duloxetine     Mental fogginess   . Morphine   . Penicillins   . Sulfa Antibiotics     I personally reviewed active problem list, medication list, allergies, family history, social history, health maintenance with the patient/caregiver today.   ROS  Constitutional: Negative for fever or weight change.  Respiratory: Negative for cough and shortness of breath.   Cardiovascular: Negative for chest pain or palpitations.  Gastrointestinal: Negative for abdominal pain, no bowel changes.  Musculoskeletal: Positive  for gait problem - uses a cane or uses a shopping cart as needed,  No swelling on joints  Skin: Negative for rash.  Neurological: Negative for dizziness or headache.  No other specific complaints in a complete review of systems (except as listed in HPI above).  Objective  Vitals:   02/04/21 1124  BP: 130/80  Pulse: 84  Resp: 16  Temp: 98.1  F (36.7 C)  TempSrc: Oral  SpO2: 97%  Weight: 269 lb 4.8 oz (122.2 kg)  Height: 5\' 4"  (1.626 m)    Body mass index is 46.23 kg/m.  Physical Exam  Constitutional: Patient appears well-developed and well-nourished. Obese  No distress.  HEENT: head atraumatic, normocephalic, pupils equal and reactive to light, , neck supple Cardiovascular: Normal rate, regular rhythm and normal heart sounds.  No murmur heard. No BLE edema. Pulmonary/Chest: Effort normal and breath sounds normal. No respiratory distress. Abdominal: Soft.  There is no tenderness. Muscular skeletal: crepitus with extension of both knees Psychiatric: Patient has a normal mood and affect. behavior is normal. Judgment and thought content normal.  PHQ2/9: Depression screen [facility] 2/9 02/04/2021 08/07/2020 02/05/2020 08/09/2019 11/29/2018  Decreased Interest 0 1 0 0 1  Down, Depressed, Hopeless 0 0 0 1 0  PHQ - 2 Score 0 1 0 1 1  Altered sleeping 0 0 0 0 0  Tired, decreased energy 1 1 1 2 1   Change in appetite 0 0 0 0 0  Feeling bad or failure about yourself  0 1 0 0 1   Trouble concentrating 0 0 0 2 0  Moving slowly or fidgety/restless 0 0 0 0 0  Suicidal thoughts 0 0 0 0 0  PHQ-9 Score 1 3 1 5 3   Difficult doing work/chores - Not difficult at all Somewhat difficult Somewhat difficult Somewhat difficult  Some recent data might be hidden    phq 9 is negative   Fall Risk: Fall Risk  02/04/2021 08/07/2020 02/05/2020 08/09/2019 11/29/2018  Falls in the past year? 0 0 0 0 0  Number falls in past yr: 0 0 0 0 0  Injury with Fall? 0 0 0 0 0  Risk for fall due to : - - - - -  Risk for fall due to: Comment - - - - -     Functional Status Survey: Is the patient deaf or have difficulty hearing?: No Does the patient have difficulty seeing, even when wearing glasses/contacts?: No Does the patient have difficulty concentrating, remembering, or making decisions?: No Does the patient have difficulty walking or climbing stairs?: Yes Does the patient have difficulty dressing or bathing?: No Does the patient have difficulty doing errands alone such as visiting a doctor's office or shopping?: No    Assessment & Plan  1. Morbid obesity (HCC)  Discussed with the patient the risk posed by an increased BMI. Discussed importance of portion control, calorie counting and at least 150 minutes of physical activity weekly. Avoid sweet beverages and drink more water. Eat at least 6 servings of fruit and vegetables daily   2. Senile purpura (HCC)   3. Primary osteoarthritis involving multiple joints   4. Vitamin B12 deficiency  Continue B12 supplementation   5. Peripheral polyneuropathy   6. Gastro-esophageal reflux disease without esophagitis  - esomeprazole (NEXIUM) 40 MG capsule; Take 1 capsule (40 mg total) by mouth daily.  Dispense: 90 capsule; Refill: 1  7. Depression, major, recurrent, mild (HCC)  Stable   8. Asthma, moderate persistent, well-controlled  - fluticasone furoate-vilanterol (BREO ELLIPTA) 100-25 MCG/INH AEPB; Inhale 1 puff into the lungs  daily.  Dispense: 60 each; Refill: 5  9. Vitamin D deficiency   10. Hyperglycemia

## 2021-02-04 ENCOUNTER — Ambulatory Visit: Payer: Medicare PPO | Admitting: Family Medicine

## 2021-02-04 ENCOUNTER — Encounter: Payer: Self-pay | Admitting: Family Medicine

## 2021-02-04 ENCOUNTER — Other Ambulatory Visit: Payer: Self-pay

## 2021-02-04 DIAGNOSIS — M159 Polyosteoarthritis, unspecified: Secondary | ICD-10-CM

## 2021-02-04 DIAGNOSIS — G629 Polyneuropathy, unspecified: Secondary | ICD-10-CM

## 2021-02-04 DIAGNOSIS — K219 Gastro-esophageal reflux disease without esophagitis: Secondary | ICD-10-CM

## 2021-02-04 DIAGNOSIS — J4540 Moderate persistent asthma, uncomplicated: Secondary | ICD-10-CM

## 2021-02-04 DIAGNOSIS — D692 Other nonthrombocytopenic purpura: Secondary | ICD-10-CM

## 2021-02-04 DIAGNOSIS — E559 Vitamin D deficiency, unspecified: Secondary | ICD-10-CM | POA: Diagnosis not present

## 2021-02-04 DIAGNOSIS — E538 Deficiency of other specified B group vitamins: Secondary | ICD-10-CM

## 2021-02-04 DIAGNOSIS — F330 Major depressive disorder, recurrent, mild: Secondary | ICD-10-CM

## 2021-02-04 DIAGNOSIS — M8949 Other hypertrophic osteoarthropathy, multiple sites: Secondary | ICD-10-CM | POA: Diagnosis not present

## 2021-02-04 DIAGNOSIS — M150 Primary generalized (osteo)arthritis: Secondary | ICD-10-CM

## 2021-02-04 DIAGNOSIS — R739 Hyperglycemia, unspecified: Secondary | ICD-10-CM

## 2021-02-04 MED ORDER — BREO ELLIPTA 100-25 MCG/INH IN AEPB: 1.0000 | INHALATION_SPRAY | Freq: Every day | RESPIRATORY_TRACT | 5 refills | Status: DC

## 2021-02-04 MED ORDER — ESOMEPRAZOLE MAGNESIUM 40 MG PO CPDR: 40.0000 mg | DELAYED_RELEASE_CAPSULE | Freq: Every day | ORAL | 1 refills | Status: DC

## 2021-03-05 DIAGNOSIS — G8929 Other chronic pain: Secondary | ICD-10-CM | POA: Diagnosis not present

## 2021-03-05 DIAGNOSIS — M170 Bilateral primary osteoarthritis of knee: Secondary | ICD-10-CM | POA: Diagnosis not present

## 2021-03-05 DIAGNOSIS — M25561 Pain in right knee: Secondary | ICD-10-CM | POA: Diagnosis not present

## 2021-03-05 DIAGNOSIS — M25562 Pain in left knee: Secondary | ICD-10-CM | POA: Diagnosis not present

## 2021-03-05 DIAGNOSIS — M25461 Effusion, right knee: Secondary | ICD-10-CM | POA: Diagnosis not present

## 2021-03-06 ENCOUNTER — Encounter: Payer: Self-pay | Admitting: Family Medicine

## 2021-03-07 ENCOUNTER — Emergency Department
Admission: EM | Admit: 2021-03-07 | Discharge: 2021-03-07 | Disposition: A | Discharge status: Home / Self Care | Payer: Medicare PPO | Attending: Emergency Medicine | Admitting: Emergency Medicine

## 2021-03-07 ENCOUNTER — Other Ambulatory Visit: Payer: Self-pay

## 2021-03-07 ENCOUNTER — Ambulatory Visit: Payer: Self-pay | Admitting: *Deleted

## 2021-03-07 DIAGNOSIS — Z7951 Long term (current) use of inhaled steroids: Secondary | ICD-10-CM | POA: Diagnosis not present

## 2021-03-07 DIAGNOSIS — I10 Essential (primary) hypertension: Secondary | ICD-10-CM | POA: Diagnosis not present

## 2021-03-07 DIAGNOSIS — F17210 Nicotine dependence, cigarettes, uncomplicated: Secondary | ICD-10-CM | POA: Diagnosis not present

## 2021-03-07 DIAGNOSIS — R030 Elevated blood-pressure reading, without diagnosis of hypertension: Secondary | ICD-10-CM | POA: Diagnosis present

## 2021-03-07 DIAGNOSIS — J45909 Unspecified asthma, uncomplicated: Secondary | ICD-10-CM | POA: Diagnosis not present

## 2021-03-07 LAB — CBC
HCT: 40.3 % (ref 36.0–46.0)
Hemoglobin: 13.1 g/dL (ref 12.0–15.0)
MCH: 30 pg (ref 26.0–34.0)
MCHC: 32.5 g/dL (ref 30.0–36.0)
MCV: 92.4 fL (ref 80.0–100.0)
Platelets: 228 10*3/uL (ref 150–400)
RBC: 4.36 MIL/uL (ref 3.87–5.11)
RDW: 13.3 % (ref 11.5–15.5)
WBC: 8.4 10*3/uL (ref 4.0–10.5)
nRBC: 0 % (ref 0.0–0.2)

## 2021-03-07 LAB — BASIC METABOLIC PANEL
Anion gap: 8 (ref 5–15)
BUN: 14 mg/dL (ref 8–23)
CO2: 28 mmol/L (ref 22–32)
Calcium: 9.1 mg/dL (ref 8.9–10.3)
Chloride: 102 mmol/L (ref 98–111)
Creatinine, Ser: 0.71 mg/dL (ref 0.44–1.00)
GFR, Estimated: >60 mL/min (ref >60)
Glucose, Bld: 110 mg/dL — ABNORMAL HIGH (ref 70–99)
Potassium: 4.4 mmol/L (ref 3.5–5.1)
Sodium: 138 mmol/L (ref 135–145)

## 2021-03-07 LAB — TSH: TSH: 1.57 u[IU]/mL (ref 0.350–4.500)

## 2021-03-07 MED ORDER — CHLORTHALIDONE 25 MG PO TABS: 25.0000 mg | ORAL_TABLET | Freq: Every day | ORAL | 1 refills | Status: DC

## 2021-03-07 NOTE — Telephone Encounter (Signed)
Per Christie Hanson pt decided to go to urgent care.

## 2021-03-07 NOTE — Telephone Encounter (Signed)
No openings with Dr Carlynn Purl until Tuesday 03/11/21

## 2021-03-07 NOTE — Telephone Encounter (Signed)
Pt called with complaints of blood pressure of 170/102 on 03/05/21 at the orthopedist's office and 208/110 03/06/21 left upper arm using home BP cuff; her normal BP <140/80; she has heard unusual loud noises in ears; in early AM 03/06/21 she had heart palpitations on 03/06/21, pulse 140 that decreased to the 80s;  recommendations made per nurse triage protocol; she verbalized understanding; the pt sees Dr Carlynn Purl at [facility]; there is no availability in the office within time frame per guidelines; pt advised to seek evaluation at Urgent Care/ED; decision tree completed; pt also offered and accepted appt with Danelle Berry 03/10/21 at 1520; pt also placed on wait list in case there are cancellations today; she can be contacted at [phone]; will route to office for notification.  Reason for Disposition . Systolic BP  >= 180 OR Diastolic >= 110  Answer Assessment - Initial Assessment Questions 1. BLOOD PRESSURE: "What is the blood pressure?" "Did you take at least two measurements 5 minutes apart?"     170/102 03/05/21 at orthopedist's office; 208/110 03/06/21 home cuff 2. ONSET: "When did you take your blood pressure?"     03/05/21 and 03/06/21 3. HOW: "How did you obtain the blood pressure?" (e.g., visiting nurse, automatic home BP monitor)     03/06/21 home cuff left upper arm 4. HISTORY: "Do you have a history of high blood pressure?"   no 5. MEDICATIONS: "Are you taking any medications for blood pressure?" "Have you missed any doses recently?"     no 6. OTHER SYMPTOMS: "Do you have any symptoms?" (e.g., headache, chest pain, blurred vision, difficulty breathing, weakness)  heart palpitations on 03/06/21, pulse 140 7. PREGNANCY: "Is there any chance you are pregnant?" "When was your last menstrual period?"  Protocols used: BLOOD PRESSURE - HIGH-A-AH

## 2021-03-07 NOTE — ED Provider Notes (Signed)
[facility] Emergency Department Provider Note   ____________________________________________    I have reviewed the triage vital signs and the nursing notes.   HISTORY  Chief Complaint Hypertension and Dizziness     HPI Christie Hanson is a 75 y.o. female who presents with complaints of high blood pressure.  Patient reports she had injections in her knee recently at the orthopedist's office and they told her blood pressure was elevated.  She does not have a history of high blood pressure and does not take anything for blood pressure.  She became concerned when she had some dizziness this morning as well which is now resolved.  She does note a history of vertigo.  Her sister checked her blood pressure at home and it was over 200 systolic.  She denies chest pain.  No headache or neurodeficits.  Past Medical History:  Diagnosis Date  . Allergy   . Asthma    wheezing  . Blurred vision   . Candidiasis of mouth   . Depression    hx of  . GERD (gastroesophageal reflux disease)   . Herniated disc, cervical   . History of carpal tunnel release   . Hyperglycemia   . Metabolic syndrome   . Obesity   . Osteoarthritis   . Ovarian deficiency   . Vitamin D deficiency     Patient Active Problem List   Diagnosis Date Noted  . BMI 45.0-49.9, adult (HCC) 02/23/2020  . Senile purpura (HCC) 03/14/2018  . Age-related macular degeneration 03/14/2018  . Morbid obesity (HCC) 03/14/2018  . Peripheral polyneuropathy 03/02/2017  . Vitamin B12 deficiency 07/29/2015  . Carpal tunnel syndrome 07/27/2015  . Osteoarthritis 07/27/2015  . Depression, major, recurrent, mild (HCC) 07/27/2015  . Gastro-esophageal reflux disease without esophagitis 07/27/2015  . Displacement of cervical intervertebral disc without myelopathy 07/27/2015  . Blood glucose elevated 07/27/2015  . Dysmetabolic syndrome 07/27/2015  . Vitamin D deficiency 07/27/2015  . Sprain of rotator  cuff capsule 07/27/2015  . Urgency of urination 07/27/2015  . Allergic rhinitis 07/27/2015  . Asthma, moderate persistent 07/27/2015    Past Surgical History:  Procedure Laterality Date  . ABDOMINAL HYSTERECTOMY  1991  . BILATERAL SALPINGOOPHORECTOMY    . CARPAL TUNNEL RELEASE Bilateral [id number]   [facility]  . CATARACT EXTRACTION W/PHACO Left 09/23/2015   Procedure: CATARACT EXTRACTION PHACO AND INTRAOCULAR LENS PLACEMENT (IOC);  Surgeon: Lockie Mola, MD;  Location: [facility] ORS;  Service: Ophthalmology;  Laterality: Left;  [place] 00:57 AP% 12.2 6.98 CDE Fluid pack lot # [id number]  . CATARACT EXTRACTION W/PHACO Right 10/17/2015   Procedure: CATARACT EXTRACTION PHACO AND INTRAOCULAR LENS PLACEMENT (IOC);  Surgeon: Lockie Mola, MD;  Location: [facility] ORS;  Service: Ophthalmology;  Laterality: Right;  [place]  00:30.5 AP   00:47.2 CDE  6.05 casette lot #[id number]  . DandC    . DILATION AND CURETTAGE OF UTERUS  1990  . HERNIA REPAIR  1992   ventral    Prior to Admission medications   Medication Sig Start Date End Date Taking? Authorizing Provider  chlorthalidone (HYGROTON) 25 MG tablet Take 1 tablet (25 mg total) by mouth daily. 03/07/21  Yes Jene Every, MD  acetaminophen (TYLENOL) 500 MG tablet Take 1,000 mg by mouth daily. 07/06/13   [provider]  albuterol (PROAIR HFA) 108 (90 Base) MCG/ACT inhaler INHALE TWO PUFFS EVERY 6 HOURS AS NEEDEDWHEEZING 03/14/18   Alba Cory, MD  Cholecalciferol (VITAMIN D) 2000 UNITS tablet Take 1 tablet by mouth  daily. Bedtime    [provider]  esomeprazole (NEXIUM) 40 MG capsule Take 1 capsule (40 mg total) by mouth daily. 02/04/21   Alba Cory, MD  fluticasone furoate-vilanterol (BREO ELLIPTA) 100-25 MCG/INH AEPB Inhale 1 puff into the lungs daily. 02/04/21   Alba Cory, MD  Multiple Vitamins-Minerals (ICAPS AREDS 2) CAPS Take 2 capsules by mouth daily.     [provider]  Probiotic Product (UP4 PROBIOTICS WOMENS  PO) Take by mouth.    [provider]  sucralfate (CARAFATE) 1 g tablet Take 1 tablet (1 g total) by mouth 4 (four) times daily -  with meals and at bedtime. Patient taking differently: Take 1 g by mouth as needed. 08/07/20   Alba Cory, MD  vitamin B-12 (CYANOCOBALAMIN) 1000 MCG tablet Take 1,000 mcg by mouth daily. Bedtime    [provider]     Allergies Duloxetine, Morphine, Penicillins, and Sulfa antibiotics  Family History  Problem Relation Age of Onset  . Hypertension Mother   . Cancer Father        prostate  . CVA Father   . Depression Father   . Healthy Sister   . Cancer Brother        prostate    Social History Social History   Tobacco Use  . Smoking status: Never Smoker  . Smokeless tobacco: Never Used  . Tobacco comment: smoking cessation materials not required  Vaping Use  . Vaping Use: Never used  Substance Use Topics  . Alcohol use: No    Alcohol/week: 0.0 standard drinks  . Drug use: No    Review of Systems  Constitutional: No fever/chills Eyes: No visual changes.  ENT: No sore throat. Cardiovascular: Denies chest pain. Respiratory: Denies shortness of breath. Gastrointestinal: No abdominal pain.  No nausea, no vomiting.   Genitourinary: Negative for dysuria. Musculoskeletal: Negative for back pain. Skin: Negative for rash. Neurological: Negative for headaches.  Vertigo as above   ____________________________________________   PHYSICAL EXAM:  VITAL SIGNS: ED Triage Vitals  Enc Vitals Group     BP 03/07/21 1012 (!) 162/81     Pulse Rate 03/07/21 1012 67     Resp 03/07/21 1012 18     Temp 03/07/21 1012 98.5 F (36.9 C)     Temp Source 03/07/21 1012 Oral     SpO2 03/07/21 1012 98 %     Weight 03/07/21 1023 122 kg (269 lb)     Height 03/07/21 1023 1.626 m (5\' 4" )     Head Circumference --      Peak Flow --      Pain Score 03/07/21 1023 0     Pain Loc --      Pain Edu? --      Excl. in GC? --      Constitutional: Alert and oriented. No acute distress. Pleasant and interactive Eyes: Conjunctivae are normal.   Nose: No congestion/rhinnorhea. Mouth/Throat: Mucous membranes are moist.    Cardiovascular: Normal rate, regular rhythm. Grossly normal heart sounds.  Good peripheral circulation. Respiratory: Normal respiratory effort.  No retractions. Lungs CTAB. Gastrointestinal: Soft and nontender. No distention.  No CVA tenderness.  Musculoskeletal: Warm and well perfused Neurologic:  Normal speech and language. No gross focal neurologic deficits are appreciated.  Skin:  Skin is warm, dry and intact. No rash noted. Psychiatric: Mood and affect are normal. Speech and behavior are normal.  ____________________________________________   LABS (all labs ordered are listed, but only abnormal results are displayed)  Labs Reviewed  BASIC METABOLIC PANEL - Abnormal; Notable for the following components:      Result Value   Glucose, Bld 110 (*)    All other components within normal limits  CBC  TSH   ____________________________________________  EKG  ED ECG REPORT I, Jene Every, the attending physician, personally viewed and interpreted this ECG.  Date: 03/07/2021  Rhythm: normal sinus rhythm QRS Axis: normal Intervals: normal ST/T Wave abnormalities: normal Narrative Interpretation: no evidence of acute ischemia  ____________________________________________  RADIOLOGY  None ____________________________________________   PROCEDURES  Procedure(s) performed: No  Procedures   Critical Care performed: No ____________________________________________   INITIAL IMPRESSION / ASSESSMENT AND PLAN / ED COURSE  Pertinent labs & imaging results that were available during my care of the patient were reviewed by me and considered in my medical decision making (see chart for details).  Patient well-appearing and in no acute distress, blood pressure mildly elevated  here, reviewed blood pressures over the last year and typically patient is within the normal range.  TSH, CBC BMP are reassuring.  We will start the patient on chlorthalidone, she does have follow-up with her PCP in 2 days for further evaluation    ____________________________________________   FINAL CLINICAL IMPRESSION(S) / ED DIAGNOSES  Final diagnoses:  Hypertension, unspecified type        Note:  This document was prepared using Dragon voice recognition software and may include unintentional dictation errors.   Jene Every, MD 03/07/21 1339

## 2021-03-07 NOTE — ED Triage Notes (Signed)
Pt to ED for HTN and dizziness for over a week. States no hx of htn . Denies cp.

## 2021-03-07 NOTE — ED Notes (Signed)
Says at home her bp was 204/108.  Felt okay.

## 2021-03-07 NOTE — Progress Notes (Signed)
Name: Christie Hanson   MRN: [id number]    DOB: Mar 07, 1946   Date:03/10/2021       Progress Note  Subjective  Chief Complaint  Hypertension  HPI  HTN new onset: patient has a history of vertigo and tinnitus, however over the past couple of weeks symptoms had been more intense and not triggered just by head movement. She went to Ortho for knee OA and bp was very high, her sister rechecked and remained elevated. Last week on 04/20 th she woke up at 4 am to use the bathroom and noticed palpitation, she checked her pulse ox and heart rate was 140 while at rest. She rested that day on Thursday her sister checked her bp again and it was in the 170 range. Her family asked her to go to Urgent Care and she was sent to [facility] from there. She denies any headaches, chest pain, change in appetite. She has a history of depression but has been off medication for months. She has been under more stress. Two weeks ago she found out her roof had a leak and it will be costly to repair, her husband has memory loss and is always watching the news ( the sound bothers her), she has been more snappy and nervous lately. Explained that depression, anxiety could be the cause of bp elevation and advised to resume SSRI, she would like to try Prozac again. Also explained that labs done in [facility] and EKG were normal but heart rate was very high and I would like to refer her to cardiologist to rule out cardiac cause as the reason for new onset hypertension.    Patient Active Problem List   Diagnosis Date Noted  . BMI 45.0-49.9, adult (HCC) 02/23/2020  . Senile purpura (HCC) 03/14/2018  . Age-related macular degeneration 03/14/2018  . Morbid obesity (HCC) 03/14/2018  . Peripheral polyneuropathy 03/02/2017  . Vitamin B12 deficiency 07/29/2015  . Carpal tunnel syndrome 07/27/2015  . Osteoarthritis 07/27/2015  . Depression, major, recurrent, mild (HCC) 07/27/2015  . Gastro-esophageal reflux disease without esophagitis 07/27/2015  .  Displacement of cervical intervertebral disc without myelopathy 07/27/2015  . Blood glucose elevated 07/27/2015  . Dysmetabolic syndrome 07/27/2015  . Vitamin D deficiency 07/27/2015  . Sprain of rotator cuff capsule 07/27/2015  . Urgency of urination 07/27/2015  . Allergic rhinitis 07/27/2015  . Asthma, moderate persistent 07/27/2015    Past Surgical History:  Procedure Laterality Date  . ABDOMINAL HYSTERECTOMY  1991  . BILATERAL SALPINGOOPHORECTOMY    . CARPAL TUNNEL RELEASE Bilateral [id number]   [facility]  . CATARACT EXTRACTION W/PHACO Left 09/23/2015   Procedure: CATARACT EXTRACTION PHACO AND INTRAOCULAR LENS PLACEMENT (IOC);  Surgeon: Lockie Mola, MD;  Location: [facility] ORS;  Service: Ophthalmology;  Laterality: Left;  [place] 00:57 AP% 12.2 6.98 CDE Fluid pack lot # [id number]  . CATARACT EXTRACTION W/PHACO Right 10/17/2015   Procedure: CATARACT EXTRACTION PHACO AND INTRAOCULAR LENS PLACEMENT (IOC);  Surgeon: Lockie Mola, MD;  Location: [facility] ORS;  Service: Ophthalmology;  Laterality: Right;  [place]  00:30.5 AP   00:47.2 CDE  6.05 casette lot #[id number]  . DandC    . DILATION AND CURETTAGE OF UTERUS  1990  . HERNIA REPAIR  1992   ventral    Family History  Problem Relation Age of Onset  . Hypertension Mother   . Cancer Father        prostate  . CVA Father   . Depression Father   . Healthy Sister   . Cancer  Brother        prostate    Social History   Tobacco Use  . Smoking status: Never Smoker  . Smokeless tobacco: Never Used  . Tobacco comment: smoking cessation materials not required  Substance Use Topics  . Alcohol use: No    Alcohol/week: 0.0 standard drinks     Current Outpatient Medications:  .  acetaminophen (TYLENOL) 500 MG tablet, Take 1,000 mg by mouth daily., Disp: , Rfl:  .  albuterol (PROAIR HFA) 108 (90 Base) MCG/ACT inhaler, INHALE TWO PUFFS EVERY 6 HOURS AS NEEDEDWHEEZING, Disp: 8.5 g, Rfl: 1 .  chlorthalidone (HYGROTON) 25 MG tablet, Take 1  tablet (25 mg total) by mouth daily., Disp: 30 tablet, Rfl: 1 .  Cholecalciferol (VITAMIN D) 2000 UNITS tablet, Take 1 tablet by mouth daily. Bedtime, Disp: , Rfl:  .  esomeprazole (NEXIUM) 40 MG capsule, Take 1 capsule (40 mg total) by mouth daily., Disp: 90 capsule, Rfl: 1 .  fluticasone furoate-vilanterol (BREO ELLIPTA) 100-25 MCG/INH AEPB, Inhale 1 puff into the lungs daily., Disp: 60 each, Rfl: 5 .  Multiple Vitamins-Minerals (ICAPS AREDS 2) CAPS, Take 2 capsules by mouth daily. , Disp: , Rfl:  .  Probiotic Product (UP4 PROBIOTICS WOMENS PO), Take by mouth., Disp: , Rfl:  .  sucralfate (CARAFATE) 1 g tablet, Take 1 tablet (1 g total) by mouth 4 (four) times daily -  with meals and at bedtime. (Patient taking differently: Take 1 g by mouth as needed.), Disp: 120 tablet, Rfl: 0 .  vitamin B-12 (CYANOCOBALAMIN) 1000 MCG tablet, Take 1,000 mcg by mouth daily. Bedtime, Disp: , Rfl:   Allergies  Allergen Reactions  . Duloxetine     Mental fogginess   . Morphine   . Penicillins   . Sulfa Antibiotics     I personally reviewed active problem list, medication list, allergies, family history, social history, health maintenance with the patient/caregiver today.   ROS  Constitutional: Negative for fever or weight change.  Respiratory: Negative for cough and shortness of breath.   Cardiovascular: Negative for chest pain or palpitations.  Gastrointestinal: Negative for abdominal pain, no bowel changes.  Musculoskeletal: positive for gait problem and intermittent  joint swelling.  Skin: Negative for rash.  Neurological: positive  for dizziness but and intermittent   headache.  No other specific complaints in a complete review of systems (except as listed in HPI above).  Objective  Vitals:   03/10/21 1407 03/10/21 1418  BP: (!) 168/94 (!) 154/96  Pulse: (!) 106   Resp: 16   Temp: 98.5 F (36.9 C)   TempSrc: Oral   SpO2: 97%   Weight: 265 lb (120.2 kg)   Height: 5\' 4"  (1.626 m)      Body mass index is 45.49 kg/m.  Physical Exam  Constitutional: Patient appears well-developed and well-nourished. Obese No distress.  HEENT: head atraumatic, normocephalic, pupils equal and reactive to light, ears normal tM bilatearlly , neck supple Cardiovascular: Normal rate, regular rhythm and normal heart sounds.  No murmur heard. No BLE edema. Pulmonary/Chest: Effort normal and breath sounds normal. No respiratory distress. Abdominal: Soft.  There is no tenderness. Muscular skeletal: using a cane , effusion right knee  Psychiatric: Patient has a normal mood and affect. behavior is normal. Judgment and thought content normal.  Recent Results (from the past 2160 hour(s))  CBC     Status: None   Collection Time: 03/07/21 10:42 AM  Result Value Ref Range   WBC 8.4 4.0 -  10.5 K/uL   RBC 4.36 3.87 - 5.11 MIL/uL   Hemoglobin 13.1 12.0 - 15.0 g/dL   HCT 98.9 21.1 - 94.1 %   MCV 92.4 80.0 - 100.0 fL   MCH 30.0 26.0 - 34.0 pg   MCHC 32.5 30.0 - 36.0 g/dL   RDW 74.0 81.4 - 48.1 %   Platelets 228 150 - 400 K/uL   nRBC 0.0 0.0 - 0.2 %    Comment: Performed at [facility], [address] [place], [place] [address]  Basic metabolic panel     Status: Abnormal   Collection Time: 03/07/21 10:42 AM  Result Value Ref Range   Sodium 138 135 - 145 mmol/L   Potassium 4.4 3.5 - 5.1 mmol/L   Chloride 102 98 - 111 mmol/L   CO2 28 22 - 32 mmol/L   Glucose, Bld 110 (H) 70 - 99 mg/dL    Comment: Glucose reference range applies only to samples taken after fasting for at least 8 hours.   BUN 14 8 - 23 mg/dL   Creatinine, Ser 4.97 0.44 - 1.00 mg/dL   Calcium 9.1 8.9 - 02.6 mg/dL   GFR, Estimated >37 >85 mL/min    Comment: (NOTE) Calculated using the CKD-EPI Creatinine Equation (2021)    Anion gap 8 5 - 15    Comment: Performed at [facility], [address] [place], [place] [address]  TSH     Status: None   Collection Time: 03/07/21 10:42 AM  Result Value Ref Range    TSH 1.570 0.350 - 4.500 uIU/mL    Comment: Performed by a 3rd Generation assay with a functional sensitivity of <=0.01 uIU/mL. Performed at [facility], [address] [place], [place] [address]       PHQ2/9: Depression screen [facility] 2/9 03/10/2021 02/04/2021 08/07/2020 02/05/2020 08/09/2019  Decreased Interest 1 0 1 0 0  Down, Depressed, Hopeless 0 0 0 0 1  PHQ - 2 Score 1 0 1 0 1  Altered sleeping 0 0 0 0 0  Tired, decreased energy 1 1 1 1 2   Change in appetite 0 0 0 0 0  Feeling bad or failure about yourself  0 0 1 0 0  Trouble concentrating 0 0 0 0 2  Moving slowly or fidgety/restless 0 0 0 0 0  Suicidal thoughts 0 0 0 0 0  PHQ-9 Score 2 1 3 1 5   Difficult doing work/chores - - Not difficult at all Somewhat difficult Somewhat difficult  Some recent data might be hidden    phq 9 is negative   Fall Risk: Fall Risk  03/10/2021 02/04/2021 08/07/2020 02/05/2020 08/09/2019  Falls in the past year? 0 0 0 0 0  Number falls in past yr: 0 0 0 0 0  Injury with Fall? 0 0 0 0 0  Risk for fall due to : - - - - -  Risk for fall due to: Comment - - - - -     Functional Status Survey: Is the patient deaf or have difficulty hearing?: No Does the patient have difficulty seeing, even when wearing glasses/contacts?: No Does the patient have difficulty concentrating, remembering, or making decisions?: Yes Does the patient have difficulty walking or climbing stairs?: Yes Does the patient have difficulty dressing or bathing?: No Does the patient have difficulty doing errands alone such as visiting a doctor's office or shopping?: No   Assessment & Plan  1. Hypertension, unspecified type  - nebivolol (BYSTOLIC) 10  MG tablet; Take 1 tablet (10 mg total) by mouth daily.  Dispense: 30 tablet; Refill: 0 - Ambulatory referral to Cardiology - chlorthalidone (HYGROTON) 25 MG tablet; Take 0.5 tablets (12.5 mg total) by mouth daily.  Dispense: 30 tablet; Refill: 0  2. Depression, major,  recurrent, mild (HCC)  - FLUoxetine (PROZAC) 20 MG tablet; Take 1 tablet (20 mg total) by mouth daily.  Dispense: 90 tablet; Refill: 1  3. Stress  She is willing to try prozac again

## 2021-03-07 NOTE — ED Notes (Signed)
Says woke with heart palpitations last night.

## 2021-03-10 ENCOUNTER — Other Ambulatory Visit: Payer: Self-pay

## 2021-03-10 ENCOUNTER — Encounter: Payer: Self-pay | Admitting: Family Medicine

## 2021-03-10 ENCOUNTER — Ambulatory Visit: Payer: Self-pay | Admitting: Family Medicine

## 2021-03-10 ENCOUNTER — Ambulatory Visit: Payer: Medicare PPO | Admitting: Family Medicine

## 2021-03-10 VITALS — BP 154/96 | HR 106 | Temp 98.5°F | Resp 16 | Ht 64.0 in | Wt 265.0 lb

## 2021-03-10 DIAGNOSIS — I10 Essential (primary) hypertension: Secondary | ICD-10-CM | POA: Diagnosis not present

## 2021-03-10 DIAGNOSIS — F330 Major depressive disorder, recurrent, mild: Secondary | ICD-10-CM | POA: Diagnosis not present

## 2021-03-10 DIAGNOSIS — F439 Reaction to severe stress, unspecified: Secondary | ICD-10-CM | POA: Diagnosis not present

## 2021-03-10 MED ORDER — FLUOXETINE HCL 20 MG PO TABS: 20.0000 mg | ORAL_TABLET | Freq: Every day | ORAL | 1 refills | Status: DC

## 2021-03-10 MED ORDER — CHLORTHALIDONE 25 MG PO TABS: 12.5000 mg | ORAL_TABLET | Freq: Every day | ORAL | 0 refills | Status: DC

## 2021-03-10 MED ORDER — NEBIVOLOL HCL 10 MG PO TABS: 10.0000 mg | ORAL_TABLET | Freq: Every day | ORAL | 0 refills | Status: DC

## 2021-03-24 ENCOUNTER — Encounter: Payer: Self-pay | Admitting: Family Medicine

## 2021-03-24 ENCOUNTER — Other Ambulatory Visit: Payer: Self-pay | Admitting: Family Medicine

## 2021-03-24 DIAGNOSIS — Z79899 Other long term (current) drug therapy: Secondary | ICD-10-CM

## 2021-03-25 DIAGNOSIS — Z79899 Other long term (current) drug therapy: Secondary | ICD-10-CM | POA: Diagnosis not present

## 2021-03-26 LAB — MAGNESIUM: Magnesium: 2 mg/dL (ref 1.5–2.5)

## 2021-03-26 LAB — POTASSIUM: Potassium: 4.2 mmol/L (ref 3.5–5.3)

## 2021-04-01 ENCOUNTER — Other Ambulatory Visit: Payer: Self-pay | Admitting: Family Medicine

## 2021-04-01 DIAGNOSIS — I10 Essential (primary) hypertension: Secondary | ICD-10-CM

## 2021-04-01 NOTE — Telephone Encounter (Signed)
Today patient reports a bp of 136/60 with pulse of 60 bpm. She states she sees cardiology on Friday. Reports she has been feeling more tired than normal and has noticed her resting HR has been around 56/57. She expressed she did not think an increase would be beneficial at this time. Advised to contact us with any further questions or concerns or if any changes in status are noted. She verbalized understanding and compliance.

## 2021-04-04 ENCOUNTER — Ambulatory Visit: Payer: Medicare PPO | Admitting: Internal Medicine

## 2021-04-04 ENCOUNTER — Encounter: Payer: Self-pay | Admitting: Internal Medicine

## 2021-04-04 ENCOUNTER — Ambulatory Visit (INDEPENDENT_AMBULATORY_CARE_PROVIDER_SITE_OTHER): Payer: Medicare PPO

## 2021-04-04 ENCOUNTER — Other Ambulatory Visit: Payer: Self-pay

## 2021-04-04 VITALS — BP 136/70 | HR 59 | Ht 64.0 in | Wt 271.0 lb

## 2021-04-04 DIAGNOSIS — I10 Essential (primary) hypertension: Secondary | ICD-10-CM | POA: Diagnosis not present

## 2021-04-04 DIAGNOSIS — R002 Palpitations: Secondary | ICD-10-CM

## 2021-04-04 MED ORDER — NEBIVOLOL HCL 5 MG PO TABS: 5.0000 mg | ORAL_TABLET | Freq: Every day | ORAL | 6 refills | Status: DC

## 2021-04-04 NOTE — Patient Instructions (Signed)
Medication Instructions:  - Your physician has recommended you make the following change in your medication:   1) DECREASE bystolic to 5 mg- take 1 tablet by mouth once daily   - you may use up your 10 mg bystolic tablets by taking 0.5 tablet once daily until they are gone  *If you need a refill on your cardiac medications before your next appointment, please call your pharmacy*   Lab Work: - Your physician recommends that you have lab work today: BMP  If you have labs (blood work) drawn today and your tests are completely normal, you will receive your results only by: Marland Kitchen MyChart Message (if you have MyChart) OR . A paper copy in the mail If you have any lab test that is abnormal or we need to change your treatment, we will call you to review the results.   Testing/Procedures: - Your physician has recommended that you wear a Zio monitor.   This monitor is a medical device that records the heart's electrical activity. Doctors most often use these monitors to diagnose arrhythmias. Arrhythmias are problems with the speed or rhythm of the heartbeat. The monitor is a small device applied to your chest. You can wear one while you do your normal daily activities. While wearing this monitor if you have any symptoms to push the button and record what you felt. Once you have worn this monitor for the period of time provider prescribed (Usually 14 days), you will return the monitor device in the postage paid box. Once it is returned they will download the data collected and provide [place] with a report which the provider will then review and we will call you with those results. Important tips:  1. Avoid showering during the first 24 hours of wearing the monitor. 2. Avoid excessive sweating to help maximize wear time. 3. Do not submerge the device, no hot tubs, and no swimming pools. 4. Keep any lotions or oils away from the patch. 5. After 24 hours you may shower with the patch on. Take brief showers  with your back facing the shower head.  6. Do not remove patch once it has been placed because that will interrupt data and decrease adhesive wear time. 7. Push the button when you have any symptoms and write down what you were feeling. 8. Once you have completed wearing your monitor, remove and place into box which has postage paid and place in your outgoing mailbox.  9. If for some reason you have misplaced your box then call our office and we can provide another box and/or mail it off for you.   Follow-Up: At [facility], you and your health needs are our priority.  As part of our continuing mission to provide you with exceptional heart care, we have created designated Provider Care Teams.  These Care Teams include your primary Cardiologist (physician) and Advanced Practice Providers (APPs -  Physician Assistants and Nurse Practitioners) who all work together to provide you with the care you need, when you need it.  We recommend signing up for the patient portal called "MyChart".  Sign up information is provided on this After Visit Summary.  MyChart is used to connect with patients for Virtual Visits (Telemedicine).  Patients are able to view lab/test results, encounter notes, upcoming appointments, etc.  Non-urgent messages can be sent to your provider as well.   To learn more about what you can do with MyChart, go to [email or web address].    Your next appointment:  6 week(s)  The format for your next appointment:   In Person  Provider:   You may see Yvonne Kendallhristopher End, MD or one of the following Advanced Practice Providers on your designated Care Team:    Nicolasa Duckinghristopher Berge, NP  Eula Listenyan Dunn, PA-C  Marisue IvanJacquelyn Visser, PA-C  Cadence MattoonFurth, [place] JerseyPA-C  Gillian Shieldsaitlin Walker, NP    Other Instructions    https://www.mata.com/https://www.nhlbi.nih.gov/files/docs/public/heart/dash_brief.pdf">  DASH Eating Plan DASH stands for Dietary Approaches to Stop Hypertension. The DASH eating plan is a healthy eating plan  that has been shown to:  Reduce high blood pressure (hypertension).  Reduce your risk for type 2 diabetes, heart disease, and stroke.  Help with weight loss. What are tips for following this plan? Reading food labels  Check food labels for the amount of salt (sodium) per serving. Choose foods with less than 5 percent of the Daily Value of sodium. Generally, foods with less than 300 milligrams (mg) of sodium per serving fit into this eating plan.  To find whole grains, look for the word "whole" as the first word in the ingredient list. Shopping  Buy products labeled as "low-sodium" or "no salt added."  Buy fresh foods. Avoid canned foods and pre-made or frozen meals. Cooking  Avoid adding salt when cooking. Use salt-free seasonings or herbs instead of table salt or sea salt. Check with your health care provider or pharmacist before using salt substitutes.  Do not fry foods. Cook foods using healthy methods such as baking, boiling, grilling, roasting, and broiling instead.  Cook with heart-healthy oils, such as olive, canola, avocado, soybean, or sunflower oil. Meal planning  Eat a balanced diet that includes: ? 4 or more servings of fruits and 4 or more servings of vegetables each day. Try to fill one-half of your plate with fruits and vegetables. ? 6-8 servings of whole grains each day. ? Less than 6 oz (170 g) of lean meat, poultry, or fish each day. A 3-oz (85-g) serving of meat is about the same size as a deck of cards. One egg equals 1 oz (28 g). ? 2-3 servings of low-fat dairy each day. One serving is 1 cup (237 mL). ? 1 serving of nuts, seeds, or beans 5 times each week. ? 2-3 servings of heart-healthy fats. Healthy fats called omega-3 fatty acids are found in foods such as walnuts, flaxseeds, fortified milks, and eggs. These fats are also found in cold-water fish, such as sardines, salmon, and mackerel.  Limit how much you eat of: ? Canned or prepackaged foods. ? Food that  is high in trans fat, such as some fried foods. ? Food that is high in saturated fat, such as fatty meat. ? Desserts and other sweets, sugary drinks, and other foods with added sugar. ? Full-fat dairy products.  Do not salt foods before eating.  Do not eat more than 4 egg yolks a week.  Try to eat at least 2 vegetarian meals a week.  Eat more home-cooked food and less restaurant, buffet, and fast food.   Lifestyle  When eating at a restaurant, ask that your food be prepared with less salt or no salt, if possible.  If you drink alcohol: ? Limit how much you use to:  0-1 drink a day for women who are not pregnant.  0-2 drinks a day for men. ? Be aware of how much alcohol is in your drink. In the [place] one drink equals one 12 oz bottle of beer (355 mL), one 5 oz glass of  wine (148 mL), or one 1 oz glass of hard liquor (44 mL). General information  Avoid eating more than 2,300 mg of salt a day. If you have hypertension, you may need to reduce your sodium intake to 1,500 mg a day.  Work with your health care provider to maintain a healthy body weight or to lose weight. Ask what an ideal weight is for you.  Get at least 30 minutes of exercise that causes your heart to beat faster (aerobic exercise) most days of the week. Activities may include walking, swimming, or biking.  Work with your health care provider or dietitian to adjust your eating plan to your individual calorie needs. What foods should I eat? Fruits All fresh, dried, or frozen fruit. Canned fruit in natural juice (without added sugar). Vegetables Fresh or frozen vegetables (raw, steamed, roasted, or grilled). Low-sodium or reduced-sodium tomato and vegetable juice. Low-sodium or reduced-sodium tomato sauce and tomato paste. Low-sodium or reduced-sodium canned vegetables. Grains Whole-grain or whole-wheat bread. Whole-grain or whole-wheat pasta. Brown rice. Orpah Cobb. Bulgur. Whole-grain and low-sodium cereals.  Pita bread. Low-fat, low-sodium crackers. Whole-wheat flour tortillas. Meats and other proteins Skinless chicken or [place]. Ground chicken or [place]. Pork with fat trimmed off. Fish and seafood. Egg whites. Dried beans, peas, or lentils. Unsalted nuts, nut butters, and seeds. Unsalted canned beans. Lean cuts of beef with fat trimmed off. Low-sodium, lean precooked or cured meat, such as sausages or meat loaves. Dairy Low-fat (1%) or fat-free (skim) milk. Reduced-fat, low-fat, or fat-free cheeses. Nonfat, low-sodium ricotta or cottage cheese. Low-fat or nonfat yogurt. Low-fat, low-sodium cheese. Fats and oils Soft margarine without trans fats. Vegetable oil. Reduced-fat, low-fat, or light mayonnaise and salad dressings (reduced-sodium). Canola, safflower, olive, avocado, soybean, and sunflower oils. Avocado. Seasonings and condiments Herbs. Spices. Seasoning mixes without salt. Other foods Unsalted popcorn and pretzels. Fat-free sweets. The items listed above may not be a complete list of foods and beverages you can eat. Contact a dietitian for more information. What foods should I avoid? Fruits Canned fruit in a light or heavy syrup. Fried fruit. Fruit in cream or butter sauce. Vegetables Creamed or fried vegetables. Vegetables in a cheese sauce. Regular canned vegetables (not low-sodium or reduced-sodium). Regular canned tomato sauce and paste (not low-sodium or reduced-sodium). Regular tomato and vegetable juice (not low-sodium or reduced-sodium). Rosita Fire. Olives. Grains Baked goods made with fat, such as croissants, muffins, or some breads. Dry pasta or rice meal packs. Meats and other proteins Fatty cuts of meat. Ribs. Fried meat. Tomasa Blase. Bologna, salami, and other precooked or cured meats, such as sausages or meat loaves. Fat from the back of a pig (fatback). Bratwurst. Salted nuts and seeds. Canned beans with added salt. Canned or smoked fish. Whole eggs or egg yolks. Chicken or [place] with  skin. Dairy Whole or 2% milk, cream, and half-and-half. Whole or full-fat cream cheese. Whole-fat or sweetened yogurt. Full-fat cheese. Nondairy creamers. Whipped toppings. Processed cheese and cheese spreads. Fats and oils Butter. Stick margarine. Lard. Shortening. Ghee. Bacon fat. Tropical oils, such as coconut, palm kernel, or palm oil. Seasonings and condiments Onion salt, garlic salt, seasoned salt, table salt, and sea salt. Worcestershire sauce. Tartar sauce. Barbecue sauce. Teriyaki sauce. Soy sauce, including reduced-sodium. Steak sauce. Canned and packaged gravies. Fish sauce. Oyster sauce. Cocktail sauce. Store-bought horseradish. Ketchup. Mustard. Meat flavorings and tenderizers. Bouillon cubes. Hot sauces. Pre-made or packaged marinades. Pre-made or packaged taco seasonings. Relishes. Regular salad dressings. Other foods Salted popcorn and pretzels. The items  listed above may not be a complete list of foods and beverages you should avoid. Contact a dietitian for more information. Where to find more information  National Heart, Lung, and Blood Institute: PopSteam.is  American Heart Association: www.heart.org  Academy of Nutrition and Dietetics: www.eatright.org  National Kidney Foundation: www.kidney.org Summary  The DASH eating plan is a healthy eating plan that has been shown to reduce high blood pressure (hypertension). It may also reduce your risk for type 2 diabetes, heart disease, and stroke.  When on the DASH eating plan, aim to eat more fresh fruits and vegetables, whole grains, lean proteins, low-fat dairy, and heart-healthy fats.  With the DASH eating plan, you should limit salt (sodium) intake to 2,300 mg a day. If you have hypertension, you may need to reduce your sodium intake to 1,500 mg a day.  Work with your health care provider or dietitian to adjust your eating plan to your individual calorie needs. This information is not intended to replace advice  given to you by your health care provider. Make sure you discuss any questions you have with your health care provider. Document Revised: 10/06/2019 Document Reviewed: 10/06/2019 Elsevier Patient Education  2021 [employer].

## 2021-04-04 NOTE — Progress Notes (Signed)
New Outpatient Visit Date: 04/04/2021  Referring Provider: Alba Cory, MD [address] [place],  [place] [address]  Chief Complaint: Hypertension  HPI:  Ms. Tussey is a 75 y.o. female who is being seen today for the evaluation of hypertension at the request of Dr. Carlynn Purl. She has a history of recently diagnosed hypertension, hyperglycemia, asthma, GERD, depression, and obesity.  Today, Ms. Rohman reports that she feels fairly well.  She notes that elevated blood pressure was just diagnosed recently when she presented to an orthopedist for joint injections.  She notes, in hindsight, that her home blood pressures had been elevated over the last few months as well.  Ms. Westermeyer presented to the ED on 03/07/2021 due to a home systolic blood pressure reading greater than 200 mmHg.  In the ED, blood pressure was less elevated with reassuring work-up.  Ongoing management was deferred to her PCP.    Ms. Strayer reports sporadic palpitations with her heart rates increasing up to 140 bpm.  Typically, the episodes are short-lived (approximately 15 seconds) though one episode that occurred at night lasted about 40 minutes before resolving on its own.  In the past, palpitations have been associated with excess caffeine intake, though this has not been the case recently.  Ms. Townsend denies a history of prior heart disease or testing.  She has not had any chest pain, shortness of breath, lightheadedness, edema, or orthopnea.  She was started on nebivolol and HCTZ 3 weeks ago by her PCP and has subsequently developed some fatigue.  She also notes resting heart rates in the low 50s at times.  She denies a history of sleep apnea but has never undergone a sleep study.  Up until the last few weeks when she was started on nebivolol and HCTZ, Ms. Michae Kava was not particularly fatigued and denies daytime somnolence and frequent  napping.  --------------------------------------------------------------------------------------------------  Cardiovascular History & Procedures: Cardiovascular Problems:  Hypertension  Palpitations  Risk Factors:  Hypertension, hyperglycemia, morbid obesity and age greater than 78  Cath/PCI:  None  CV Surgery:  None  EP Procedures and Devices:  None  Non-Invasive Evaluation(s):  None  Recent CV Pertinent Labs: Lab Results  Component Value Date   CHOL 156 01/24/2016   HDL 45 01/24/2016   LDLCALC 94 01/24/2016   TRIG 86 01/24/2016   CHOLHDL 3.5 01/24/2016   K 4.2 03/25/2021   MG 2.0 03/25/2021   BUN 14 03/07/2021   BUN 14 01/24/2016   CREATININE 0.71 03/07/2021   CREATININE 0.74 08/07/2020    --------------------------------------------------------------------------------------------------  Past Medical History:  Diagnosis Date  . Allergy   . Asthma    wheezing  . Blurred vision   . Candidiasis of mouth   . Depression    hx of  . GERD (gastroesophageal reflux disease)   . Herniated disc, cervical   . History of carpal tunnel release   . Hyperglycemia   . Metabolic syndrome   . Obesity   . Osteoarthritis   . Ovarian deficiency   . Vitamin D deficiency     Past Surgical History:  Procedure Laterality Date  . ABDOMINAL HYSTERECTOMY  1991  . BILATERAL SALPINGOOPHORECTOMY    . CARPAL TUNNEL RELEASE Bilateral [id number]   [facility]  . CATARACT EXTRACTION W/PHACO Left 09/23/2015   Procedure: CATARACT EXTRACTION PHACO AND INTRAOCULAR LENS PLACEMENT (IOC);  Surgeon: Lockie Mola, MD;  Location: [facility] ORS;  Service: Ophthalmology;  Laterality: Left;  [place] 00:57 AP% 12.2 6.98 CDE Fluid pack lot #  [id number]  . CATARACT EXTRACTION W/PHACO Right 10/17/2015   Procedure: CATARACT EXTRACTION PHACO AND INTRAOCULAR LENS PLACEMENT (IOC);  Surgeon: Lockie Mola, MD;  Location: [facility] OR[facility];  Service: Ophthalmology;  Laterality: Right;  [place]  00:30.5 AP    00:47.2 CDE  6.05 casette lot #[id number]  . DandC    . DILATION AND CURETTAGE OF UTERUS  1990  . HERNIA REPAIR  1992   ventral    Current Meds  Medication Sig  . acetaminophen (TYLENOL) 500 MG tablet Take 1,000 mg by mouth 2 (two) times daily.  Marland Kitchen albuterol (PROAIR HFA) 108 (90 Base) MCG/ACT inhaler INHALE TWO PUFFS EVERY 6 HOURS AS NEEDEDWHEEZING  . chlorthalidone (HYGROTON) 25 MG tablet Take 0.5 tablets (12.5 mg total) by mouth daily.  . Cholecalciferol (VITAMIN D) 2000 UNITS tablet Take 1 tablet by mouth daily. Bedtime  . esomeprazole (NEXIUM) 40 MG capsule Take 1 capsule (40 mg total) by mouth daily.  Marland Kitchen FLUoxetine (PROZAC) 20 MG tablet Take 1 tablet (20 mg total) by mouth daily.  . fluticasone furoate-vilanterol (BREO ELLIPTA) 100-25 MCG/INH AEPB Inhale 1 puff into the lungs daily.  . Multiple Vitamins-Minerals (ICAPS AREDS 2) CAPS Take 2 capsules by mouth daily.   . nebivolol (BYSTOLIC) 10 MG tablet TAKE 1 TABLET BY MOUTH EVERY DAY  . Probiotic Product (UP4 PROBIOTICS WOMENS PO) Take by mouth daily.  . sucralfate (CARAFATE) 1 g tablet Take 1 g by mouth 4 (four) times daily as needed. With meals  . vitamin B-12 (CYANOCOBALAMIN) 1000 MCG tablet Take 1,000 mcg by mouth daily. Bedtime    Allergies: Duloxetine, Morphine, Penicillins, and Sulfa antibiotics  Social History   Tobacco Use  . Smoking status: Never Smoker  . Smokeless tobacco: Never Used  . Tobacco comment: smoking cessation materials not required  Vaping Use  . Vaping Use: Never used  Substance Use Topics  . Alcohol use: No    Alcohol/week: 0.0 standard drinks  . Drug use: No    Family History  Problem Relation Age of Onset  . Hypertension Mother   . Cancer Father        prostate  . CVA Father   . Depression Father   . Healthy Sister   . Cancer Brother        prostate    Review of Systems: A 12-system review of systems was performed and was negative except as noted in the  HPI.  --------------------------------------------------------------------------------------------------  Physical Exam: BP 136/70 (BP Location: Left Arm, Patient Position: Sitting, Cuff Size: Large)   Pulse (!) 59   Ht 5\' 4"  (1.626 m)   Wt 271 lb (122.9 kg)   SpO2 95%   BMI 46.52 kg/m   General: Morbidly obese woman, seated comfortably in the exam room. HEENT: No conjunctival pallor or scleral icterus. Facemask in place. Neck: Supple without lymphadenopathy, thyromegaly, JVD, or HJR, though body habitus limits evaluation. No carotid bruit. Lungs: Normal work of breathing. Clear to auscultation bilaterally without wheezes or crackles. Heart: Bradycardic but regular without murmurs, rubs, or gallops.  Unable to assess PMI due to body habitus. Abd: Bowel sounds present. Soft, NT/ND.  Unable to assess HSM due to body habitus. Ext: No lower extremity edema. Radial, PT, and DP pulses are 2+ bilaterally Skin: Warm and dry without rash. Neuro: CNIII-XII intact. Strength and fine-touch sensation intact in upper and lower extremities bilaterally. Psych: Normal mood and affect.  EKG: Sinus bradycardia with borderline LVH.  Otherwise, no significant abnormality.  Lab Results  Component Value  Date   WBC 8.4 03/07/2021   HGB 13.1 03/07/2021   HCT 40.3 03/07/2021   MCV 92.4 03/07/2021   PLT 228 03/07/2021    Lab Results  Component Value Date   NA 138 03/07/2021   K 4.2 03/25/2021   CL 102 03/07/2021   CO2 28 03/07/2021   BUN 14 03/07/2021   CREATININE 0.71 03/07/2021   GLUCOSE 110 (H) 03/07/2021   ALT 9 08/07/2020    Lab Results  Component Value Date   CHOL 156 01/24/2016   HDL 45 01/24/2016   LDLCALC 94 01/24/2016   TRIG 86 01/24/2016   CHOLHDL 3.5 01/24/2016     --------------------------------------------------------------------------------------------------  ASSESSMENT AND PLAN: Hypertension: Blood pressure upper normal today with initiation of nebivolol and HCTZ 3  weeks ago.  Ms. Bellucci has noticed increased fatigue since starting these medications, most likely associated with beta-blockade (resting heart rates have been as low as 52 per her report).  I have recommended decreasing nebivolol to 5 mg daily.  Ultimately, it may be worthwhile to transition her away from beta-blockers altogether if she continues to have symptoms.  We will continue current dose of HCTZ.  Importance of sodium restriction as well as weight loss through diet and exercise were discussed.  Sleep study may be useful in the future, particularly if fatigue persist despite de-escalation/discontinuation of beta-blockade.  Palpitations: These have been sporadically present for years, usually associated with caffeine consumption.  More recently, palpitations have been more pronounced, with 1 episode lasting up to 40 minutes.  We have agreed to obtain a 14-day event monitor for further characterization.  Morbid obesity: Weight loss encouraged through diet and exercise, with BMI currently greater than 40.  Follow-up: Return to clinic in 6 weeks.  Yvonne Kendall, MD 04/04/2021 2:24 PM

## 2021-04-05 LAB — BASIC METABOLIC PANEL
BUN/Creatinine Ratio: 28 (ref 12–28)
BUN: 24 mg/dL (ref 8–27)
CO2: 26 mmol/L (ref 20–29)
Calcium: 9.2 mg/dL (ref 8.7–10.3)
Chloride: 94 mmol/L — ABNORMAL LOW (ref 96–106)
Creatinine, Ser: 0.87 mg/dL (ref 0.57–1.00)
Glucose: 90 mg/dL (ref 65–99)
Potassium: 4.5 mmol/L (ref 3.5–5.2)
Sodium: 136 mmol/L (ref 134–144)
eGFR: 70 mL/min/{1.73_m2} (ref >59)

## 2021-04-06 ENCOUNTER — Encounter: Payer: Self-pay | Admitting: Internal Medicine

## 2021-04-06 DIAGNOSIS — I10 Essential (primary) hypertension: Secondary | ICD-10-CM | POA: Insufficient documentation

## 2021-04-06 DIAGNOSIS — R002 Palpitations: Secondary | ICD-10-CM | POA: Insufficient documentation

## 2021-04-18 DIAGNOSIS — R002 Palpitations: Secondary | ICD-10-CM | POA: Diagnosis not present

## 2021-04-25 DIAGNOSIS — R002 Palpitations: Secondary | ICD-10-CM | POA: Diagnosis not present

## 2021-04-26 ENCOUNTER — Other Ambulatory Visit: Payer: Self-pay | Admitting: Family Medicine

## 2021-04-26 DIAGNOSIS — I10 Essential (primary) hypertension: Secondary | ICD-10-CM

## 2021-04-26 NOTE — Telephone Encounter (Signed)
dc'd 04/01/21 #30 Dose changed Dr Carlynn Purl

## 2021-04-29 DIAGNOSIS — H353132 Nonexudative age-related macular degeneration, bilateral, intermediate dry stage: Secondary | ICD-10-CM | POA: Diagnosis not present

## 2021-04-29 DIAGNOSIS — Z0100 Encounter for examination of eyes and vision without abnormal findings: Secondary | ICD-10-CM | POA: Diagnosis not present

## 2021-05-18 NOTE — Progress Notes (Signed)
Cardiology Office Note    Date:  05/20/2021   ID:  Christie Hanson, DOB Mar 26, 1946, MRN [id number]  PCP:  Alba Cory, MD  Cardiologist:  Yvonne Kendall, MD  Electrophysiologist:  None   Chief Complaint: Follow up  History of Present Illness:   Christie Hanson is a 75 y.o. female with history of HTN, atrial tachycardia, PACs, hyperglycemia, asthma, GERD, depression, and obesity who presents for follow up of Zio patch.   She was evaluated as a new patient by Dr. Okey Dupre on 04/04/2021 at the request of her PCP for hypertension. At that visit, she noted elevated BP was just recently diagnosed when she presented to her orthopedist for joint injections, though did report, in hindsight, her BPs had been elevated for several months at home. She had been seen in the ED in 02/2021 with elevated BP of > 200 mmHg at home with reassuring work up and recommendation for her to follow up with her PCP. She also noted sporadic palpitations with her heart rate increasing up to 140 bpm, with episodes usually lasting 15 seconds in duration, with 1 episode lasting approximately 40 minutes before spontaneous resolution. She noted, in the past, her palpitations were associated with excess caffeine intake, though this had not been the case more recently. With regards to her BP, she was noted to have been recently started on HCTZ and Bystolic by her PCP in early 03/2021, with subsequent development of fatigue and heart rates in the 50s bpm at times. At her visit with Dr. Okey Dupre, her BP was reasonably controlled at 136/70 with a heart rate of 59 bpm. Her Bystolic was decreased to 5 mg and sodium restriction was advised. Subsequent outpatient cardiac monitoring showed a predominant rhythm of sinus with an average heart rate of 55 bpm (range 43-96 bpm in sinus), occasional PACs representing a 3.9% burden, rare PVCs, 4 atrial runs lasting up to 7 beats with a maximum rate of 148 bpm, and no sustained arrhythmias or prolonged  pauses noted. Patient triggered events corresponded to sinus rhythm, PACs, and PVCs.   She comes in today noting significant fatigue and somnolence since initiating Bystolic.  She reports prior to this medication her activity level was much better.  Home BPs have predominantly been in the 1 teens to 130s over 60s to 70s with an isolated reading of 160/60 on 7/4.  No chest pain or dyspnea.  Her palpitations remain largely unchanged despite cessation of caffeine.  She notes these are more so noted in the evening hours.  No dizziness, presyncope, or syncope.  No lower extremity swelling.   Labs independently reviewed: 03/2021 - BUN 24, SCr 0.87, potassium 4.5, magnesium 2.0 02/2021 - TSH normal 07/2020 - albumin 4.3, AST/ALT normal, A1c 5.5 01/2016 - TC 156, TG 86, HDL 45, LDL 94  Past Medical History:  Diagnosis Date   Allergy    Asthma    wheezing   Blurred vision    Candidiasis of mouth    Depression    hx of   GERD (gastroesophageal reflux disease)    Herniated disc, cervical    History of carpal tunnel release    Hyperglycemia    Metabolic syndrome    Obesity    Osteoarthritis    Ovarian deficiency    Vitamin D deficiency     Past Surgical History:  Procedure Laterality Date   ABDOMINAL HYSTERECTOMY  1991   BILATERAL SALPINGOOPHORECTOMY     CARPAL TUNNEL RELEASE Bilateral [id number]  [facility]   CATARACT EXTRACTION W/PHACO Left 09/23/2015   Procedure: CATARACT EXTRACTION PHACO AND INTRAOCULAR LENS PLACEMENT (IOC);  Surgeon: Lockie Mola, MD;  Location: [facility];  Service: Ophthalmology;  Laterality: Left;  [place] 00:57 AP% 12.2 6.98 CDE Fluid pack lot # [id number]   CATARACT EXTRACTION W/PHACO Right 10/17/2015   Procedure: CATARACT EXTRACTION PHACO AND INTRAOCULAR LENS PLACEMENT (IOC);  Surgeon: Lockie Mola, MD;  Location: [facility];  Service: Ophthalmology;  Laterality: Right;  [place]  00:30.5 AP   00:47.2 CDE  6.05 casette lot #[id number]   DandC     DILATION AND CURETTAGE OF  UTERUS  1990   HERNIA REPAIR  1992   ventral    Current Medications: Current Meds  Medication Sig   acetaminophen (TYLENOL) 500 MG tablet Take 1,000 mg by mouth 2 (two) times daily.   albuterol (PROAIR HFA) 108 (90 Base) MCG/ACT inhaler INHALE TWO PUFFS EVERY 6 HOURS AS NEEDEDWHEEZING   Cholecalciferol (VITAMIN D) 2000 UNITS tablet Take 1 tablet by mouth daily. Bedtime   diltiazem (CARDIZEM LA) 120 MG 24 hr tablet Take 1 tablet (120 mg total) by mouth daily.   esomeprazole (NEXIUM) 40 MG capsule Take 1 capsule (40 mg total) by mouth daily.   FLUoxetine (PROZAC) 20 MG tablet Take 1 tablet (20 mg total) by mouth daily.   Multiple Vitamins-Minerals (ICAPS AREDS 2) CAPS Take 2 capsules by mouth daily.    Probiotic Product (UP4 PROBIOTICS WOMENS PO) Take by mouth daily.   sucralfate (CARAFATE) 1 g tablet Take 1 g by mouth 4 (four) times daily as needed. With meals   vitamin B-12 (CYANOCOBALAMIN) 1000 MCG tablet Take 1,000 mcg by mouth daily. Bedtime   [DISCONTINUED] chlorthalidone (HYGROTON) 25 MG tablet Take 0.5 tablets (12.5 mg total) by mouth daily.   [DISCONTINUED] nebivolol (BYSTOLIC) 5 MG tablet Take 1 tablet (5 mg total) by mouth daily.    Allergies:   Duloxetine, Morphine, Penicillins, and Sulfa antibiotics   Social History   Socioeconomic History   Marital status: Married    Spouse name: Christie Hanson   Number of children: 4   Years of education: Not on file   Highest education level: Associate degree: occupational, Scientist, product/process development, or vocational program  Occupational History   Occupation: Retired  Tobacco Use   Smoking status: Never   Smokeless tobacco: Never   Tobacco comments:    smoking cessation materials not required  Vaping Use   Vaping Use: Never used  Substance and Sexual Activity   Alcohol use: No    Alcohol/week: 0.0 standard drinks   Drug use: No   Sexual activity: Yes    Partners: Male  Other Topics Concern   Not on file  Social History Narrative   Not on file    Social Determinants of Health   Financial Resource Strain: Not on file  Food Insecurity: Not on file  Transportation Needs: Not on file  Physical Activity: Not on file  Stress: Not on file  Social Connections: Not on file     Family History:  The patient's family history includes CVA in her father; Cancer in her brother and father; Depression in her father; Healthy in her sister; Hypertension in her mother.  ROS:   Review of Systems  Constitutional:  Positive for malaise/fatigue. Negative for chills, diaphoresis, fever and weight loss.       Increased somnolence  HENT:  Negative for congestion.   Eyes:  Negative for discharge and redness.  Respiratory:  Negative for cough, sputum  production, shortness of breath and wheezing.   Cardiovascular:  Positive for palpitations. Negative for chest pain, orthopnea, claudication, leg swelling and PND.  Gastrointestinal:  Negative for abdominal pain, heartburn, nausea and vomiting.  Musculoskeletal:  Negative for falls and myalgias.  Skin:  Negative for rash.  Neurological:  Negative for dizziness, tingling, tremors, sensory change, speech change, focal weakness, loss of consciousness and weakness.  Endo/Heme/Allergies:  Does not bruise/bleed easily.  Psychiatric/Behavioral:  Negative for substance abuse. The patient is not nervous/anxious.   All other systems reviewed and are negative.   EKGs/Labs/Other Studies Reviewed:    Studies reviewed were summarized above. The additional studies were reviewed today:  Zio patch 03/2021: The patient was monitored for 14 days. The predominant rhythm was sinus with an average rate of 55 bpm (range 43-96 bpm and sinus). There were occasional PACs (3.9% burden) and rare PVCs. 4 atrial runs lasting up to 7 beats with a maximum rate of 148 bpm were observed. No sustained arrhythmia or prolonged pause was seen. Patient triggered events correspond to sinus rhythm, PACs, and PVCs.   Predominantly sinus  rhythm with occasional PACs and rare PVCs.  A few brief episodes of PSVT also noted.   EKG:  EKG is ordered today.  The EKG ordered today demonstrates NSR with sinus arrhythmia, 60 bpm, and no acute ST-T changes  Recent Labs: 08/07/2020: ALT 9 03/07/2021: Hemoglobin 13.1; Platelets 228; TSH 1.570 03/25/2021: Magnesium 2.0 04/04/2021: BUN 24; Creatinine, Ser 0.87; Potassium 4.5; Sodium 136  Recent Lipid Panel    Component Value Date/Time   CHOL 156 01/24/2016 0920   TRIG 86 01/24/2016 0920   HDL 45 01/24/2016 0920   CHOLHDL 3.5 01/24/2016 0920   LDLCALC 94 01/24/2016 0920    PHYSICAL EXAM:    VS:  BP (!) 158/80 (BP Location: Left Arm, Patient Position: Sitting, Cuff Size: Large)   Pulse 60   Ht 5\' 4"  (1.626 m)   Wt 269 lb 2 oz (122.1 kg)   SpO2 98%   BMI 46.20 kg/m   BMI: Body mass index is 46.2 kg/m.  Physical Exam Vitals reviewed.  Constitutional:      Appearance: She is well-developed.  HENT:     Head: Normocephalic and atraumatic.  Eyes:     General:        Right eye: No discharge.        Left eye: No discharge.  Neck:     Vascular: No JVD.  Cardiovascular:     Rate and Rhythm: Normal rate and regular rhythm.     Pulses:          Posterior tibial pulses are 2+ on the right side and 2+ on the left side.     Heart sounds: Normal heart sounds, S1 normal and S2 normal. Heart sounds not distant. No midsystolic click and no opening snap. No murmur heard.   No friction rub.  Pulmonary:     Effort: Pulmonary effort is normal. No respiratory distress.     Breath sounds: Normal breath sounds. No decreased breath sounds, wheezing or rales.  Chest:     Chest wall: No tenderness.  Abdominal:     General: There is no distension.     Palpations: Abdomen is soft.     Tenderness: There is no abdominal tenderness.  Musculoskeletal:     Cervical back: Normal range of motion.     Right lower leg: No edema.     Left lower leg: No edema.  Skin:  General: Skin is warm and  dry.     Nails: There is no clubbing.  Neurological:     Mental Status: She is alert and oriented to person, place, and time.  Psychiatric:        Speech: Speech normal.        Behavior: Behavior normal.        Thought Content: Thought content normal.        Judgment: Judgment normal.    Wt Readings from Last 3 Encounters:  05/20/21 269 lb 2 oz (122.1 kg)  04/04/21 271 lb (122.9 kg)  03/10/21 265 lb (120.2 kg)     ASSESSMENT & PLAN:   HTN: Blood pressure is elevated in the office today though has been better controlled at home with readings predominantly in the 1 teens to 130s systolic.  Due to intolerance we have elected to discontinue Bystolic as outlined below.  We will add Cardizem LA 120 mg daily in its place in an effort to minimize palpitation burden, assist with BP control, and improve in adverse effect associated with beta-blockade.  Should her fatigue/somnolence persist despite discontinuation of beta-blocker, would recommend a sleep study at that time.  Atrial tachycardia/PACs: No significant sustained arrhythmias noted on recent outpatient cardiac monitoring.  We are discontinuing Bystolic secondary to intolerance with fatigue.  In its place we will initiate Cardizem LA 120 mg daily.  Minimization of caffeine should be continued.  Recent electrolytes at goal and normal TSH.  Morbid obesity: Weight loss continues to be encouraged through heart healthy diet and regular exercise.  Fatigue: Onset appears to be associated with initiation of Bystolic.  This medication has been discontinued today.  The symptoms of fatigue persist, despite discontinuation of beta-blocker, sleep study would be indicated.  Recent TSH normal.  Disposition: F/u with Dr. Okey DupreEnd or an APP in 1 month.   Medication Adjustments/Labs and Tests Ordered: Current medicines are reviewed at length with the patient today.  Concerns regarding medicines are outlined above. Medication changes, Labs and Tests ordered  today are summarized above and listed in the Patient Instructions accessible in Encounters.   Signed, Eula Listenyan Phuc Kluttz, PA-C 05/20/2021 12:25 PM     [facility] [place] [address] [place], [place] [address] [phone]

## 2021-05-20 ENCOUNTER — Ambulatory Visit: Payer: Medicare PPO | Admitting: Physician Assistant

## 2021-05-20 ENCOUNTER — Encounter: Payer: Self-pay | Admitting: Physician Assistant

## 2021-05-20 ENCOUNTER — Other Ambulatory Visit: Payer: Self-pay

## 2021-05-20 VITALS — BP 158/80 | HR 60 | Ht 64.0 in | Wt 269.1 lb

## 2021-05-20 DIAGNOSIS — I10 Essential (primary) hypertension: Secondary | ICD-10-CM

## 2021-05-20 DIAGNOSIS — I491 Atrial premature depolarization: Secondary | ICD-10-CM

## 2021-05-20 DIAGNOSIS — R5383 Other fatigue: Secondary | ICD-10-CM

## 2021-05-20 DIAGNOSIS — I471 Supraventricular tachycardia: Secondary | ICD-10-CM

## 2021-05-20 MED ORDER — CHLORTHALIDONE 25 MG PO TABS: 12.5000 mg | ORAL_TABLET | Freq: Every day | ORAL | 2 refills | Status: DC

## 2021-05-20 MED ORDER — DILTIAZEM HCL ER COATED BEADS 120 MG PO TB24: 120.0000 mg | ORAL_TABLET | Freq: Every day | ORAL | 3 refills | Status: DC

## 2021-05-20 NOTE — Patient Instructions (Signed)
Medication Instructions:  Your physician has recommended you make the following change in your medication:   STOP Bystolic START Cardizem LA 120 MG once daily  *If you need a refill on your cardiac medications before your next appointment, please call your pharmacy*   Lab Work: None  If you have labs (blood work) drawn today and your tests are completely normal, you will receive your results only by: MyChart Message (if you have MyChart) OR A paper copy in the mail If you have any lab test that is abnormal or we need to change your treatment, we will call you to review the results.   Testing/Procedures: None   Follow-Up: At [facility], you and your health needs are our priority.  As part of our continuing mission to provide you with exceptional heart care, we have created designated Provider Care Teams.  These Care Teams include your primary Cardiologist (physician) and Advanced Practice Providers (APPs -  Physician Assistants and Nurse Practitioners) who all work together to provide you with the care you need, when you need it.   Your next appointment:   1 month(s)  The format for your next appointment:   In Person  Provider:   Yvonne Kendall, MD or Eula Listen, PA-C

## 2021-06-04 ENCOUNTER — Telehealth: Payer: Self-pay | Admitting: Physician Assistant

## 2021-06-04 NOTE — Telephone Encounter (Signed)
Please call to discuss Cardizem. Patient will be out before her appointment but would not like to fill again in case this needs to be changed. Patient states she still does not feel well after having started taking this. States she is feeling fatigued. Please call to discuss.

## 2021-06-04 NOTE — Telephone Encounter (Signed)
Spoke  with the patient. Patient sts there was a miscommunication in the message. She is having some fatigue on diltiazem but it has improved since switching from bystolic. She was calling to see if her appt with Eula Listen, PA could possibly be moved up, she was willing to wait and discuss her medication concern with Alycia Rossetti. She just did not want to fill 30 days of diltiazem if her medication was going to be changed.  Adv the patient that her scheduled appt on 06/25/21 is gong to be the earliest appt available. Patient sts that she will keep the appt. Suggested that for her next refill she ask her pharmacy to fill 7 days vs 30 days if that would be more cost effective for her. Patient verbalized understanding and voiced appreciation for the call back.

## 2021-06-19 NOTE — Progress Notes (Signed)
Cardiology Office Note    Date:  06/25/2021   ID:  Christie Hanson, DOB 19-Jan-1946, MRN [id number]  PCP:  Alba Cory, MD  Cardiologist:  Yvonne Kendall, MD  Electrophysiologist:  None   Chief Complaint: Follow-up  History of Present Illness:   Christie Hanson is a 75 y.o. female with history of HTN, atrial tachycardia, PACs, hyperglycemia, asthma, vertigo, GERD, depression, and obesity who presents for follow up of HTN and palpitations.   She was evaluated as a new patient by Dr. Okey Dupre on 04/04/2021 at the request of her PCP for hypertension. At that visit, she noted elevated BP was just recently diagnosed when she presented to her orthopedist for joint injections, though did report, in hindsight, her BPs had been elevated for several months at home. She had been seen in the ED in 02/2021 with elevated BP of > 200 mmHg at home with reassuring work up and recommendation for her to follow up with her PCP. She also noted sporadic palpitations with her heart rate increasing up to 140 bpm, with episodes usually lasting 15 seconds in duration, with 1 episode lasting approximately 40 minutes before spontaneous resolution. She noted, in the past, her palpitations were associated with excess caffeine intake, though this had not been the case more recently. With regards to her BP, she had been recently started on HCTZ and Bystolic by her PCP in early 03/2021, with subsequent development of fatigue and heart rates in the 50s bpm at times. At her visit with Dr. Okey Dupre, her BP was reasonably controlled at 136/70 with a heart rate of 59 bpm. Her Bystolic was decreased to 5 mg and sodium restriction was advised. Subsequent outpatient cardiac monitoring showed a predominant rhythm of sinus with an average heart rate of 55 bpm (range 43-96 bpm in sinus), occasional PACs representing a 3.9% burden, rare PVCs, 4 atrial runs lasting up to 7 beats with a maximum rate of 148 bpm, and no sustained arrhythmias or  prolonged pauses noted. Patient triggered events corresponded to sinus rhythm, PACs, and PVCs.   She was seen in follow-up on 05/20/2021, and continued to note significant fatigue and somnolence since initiating Bystolic.  She reported prior to initiating Bystolic, her activity level was much better.  Home BPs were in the 1 teens to 130s over 60s to 70s.  Her palpitations remain largely unchanged despite cessation of caffeine.  Given continued fatigue, Bystolic was discontinued.  She was placed on Cardizem LA 120 mg daily.  She comes in doing reasonably well today from a cardiac perspective.  Since discontinuing beta-blocker, she notes an improvement in her fatigue she does feel like Cardizem has helped with her palpitations.  However, since starting Cardizem, she has had an increase in nightmares and difficulty falling asleep with noted mind racing.  She also feels like there has been some degree of lower extremity swelling following the initiation of Cardizem.  She would like to have 1 cup of half-caffeine coffee daily.  She also notes an increase in her vertigo.  Since starting chlorthalidone she has noted a significant increase in her thirst.  BP at home has largely been well controlled in the 130s to 140s systolic.  Heart rates have improved to the 60s to 70s bpm off beta-blocker.  Since she was last seen, she did have 1 mechanical fall and did not hit her head or suffer LOC.     Labs independently reviewed: 03/2021 - BUN 24, SCr 0.87, potassium 4.5, magnesium 2.0  02/2021 - TSH normal 07/2020 - albumin 4.3, AST/ALT normal, A1c 5.5 01/2016 - TC 156, TG 86, HDL 45, LDL 94    Past Medical History:  Diagnosis Date   Allergy    Asthma    wheezing   Blurred vision    Candidiasis of mouth    Depression    hx of   GERD (gastroesophageal reflux disease)    Herniated disc, cervical    History of carpal tunnel release    Hyperglycemia    Metabolic syndrome    Obesity    Osteoarthritis    Ovarian  deficiency    Vitamin D deficiency     Past Surgical History:  Procedure Laterality Date   ABDOMINAL HYSTERECTOMY  1991   BILATERAL SALPINGOOPHORECTOMY     CARPAL TUNNEL RELEASE Bilateral [id number]   [facility]   CATARACT EXTRACTION W/PHACO Left 09/23/2015   Procedure: CATARACT EXTRACTION PHACO AND INTRAOCULAR LENS PLACEMENT (IOC);  Surgeon: Lockie Mola, MD;  Location: [facility] ORS;  Service: Ophthalmology;  Laterality: Left;  [place] 00:57 AP% 12.2 6.98 CDE Fluid pack lot # [id number]   CATARACT EXTRACTION W/PHACO Right 10/17/2015   Procedure: CATARACT EXTRACTION PHACO AND INTRAOCULAR LENS PLACEMENT (IOC);  Surgeon: Lockie Mola, MD;  Location: [facility] ORS;  Service: Ophthalmology;  Laterality: Right;  [place]  00:30.5 AP   00:47.2 CDE  6.05 casette lot #[id number]   DandC     DILATION AND CURETTAGE OF UTERUS  1990   HERNIA REPAIR  1992   ventral    Current Medications: Current Meds  Medication Sig   acetaminophen (TYLENOL) 500 MG tablet Take 1,000 mg by mouth 2 (two) times daily.   albuterol (PROAIR HFA) 108 (90 Base) MCG/ACT inhaler INHALE TWO PUFFS EVERY 6 HOURS AS NEEDEDWHEEZING   Cholecalciferol (VITAMIN D) 2000 UNITS tablet Take 1 tablet by mouth daily. Bedtime   diltiazem (CARDIZEM) 30 MG tablet Take 1 tablet (30 mg total) by mouth 2 (two) times daily as needed (As needed for fast heart rates or palpitations).   esomeprazole (NEXIUM) 40 MG capsule Take 1 capsule (40 mg total) by mouth daily.   famotidine (PEPCID) 40 MG tablet Take 40 mg by mouth daily.   FLUoxetine (PROZAC) 20 MG tablet Take 1 tablet (20 mg total) by mouth daily.   losartan (COZAAR) 25 MG tablet Take 1 tablet (25 mg total) by mouth daily.   Multiple Vitamins-Minerals (ICAPS AREDS 2) CAPS Take 2 capsules by mouth daily.    Probiotic Product (UP4 PROBIOTICS WOMENS PO) Take by mouth daily.   sucralfate (CARAFATE) 1 g tablet Take 1 g by mouth 4 (four) times daily as needed. With meals   vitamin B-12 (CYANOCOBALAMIN) 1000  MCG tablet Take 1,000 mcg by mouth daily. Bedtime   [DISCONTINUED] chlorthalidone (HYGROTON) 25 MG tablet Take 0.5 tablets (12.5 mg total) by mouth daily.   [DISCONTINUED] diltiazem (CARDIZEM LA) 120 MG 24 hr tablet Take 1 tablet (120 mg total) by mouth daily.    Allergies:   Duloxetine, Morphine, Penicillins, and Sulfa antibiotics   Social History   Socioeconomic History   Marital status: Married    Spouse name: Maisie Fus   Number of children: 4   Years of education: Not on file   Highest education level: Associate degree: occupational, Scientist, product/process development, or vocational program  Occupational History   Occupation: Retired  Tobacco Use   Smoking status: Never   Smokeless tobacco: Never   Tobacco comments:    smoking cessation materials not required  Vaping Use  Vaping Use: Never used  Substance and Sexual Activity   Alcohol use: No    Alcohol/week: 0.0 standard drinks   Drug use: No   Sexual activity: Yes    Partners: Male  Other Topics Concern   Not on file  Social History Narrative   Not on file   Social Determinants of Health   Financial Resource Strain: Not on file  Food Insecurity: Not on file  Transportation Needs: Not on file  Physical Activity: Not on file  Stress: Not on file  Social Connections: Not on file     Family History:  The patient's family history includes CVA in her father; Cancer in her brother and father; Depression in her father; Healthy in her sister; Hypertension in her mother.  ROS:   Review of Systems  Constitutional:  Positive for malaise/fatigue. Negative for chills, diaphoresis, fever and weight loss.  HENT:  Negative for congestion.   Eyes:  Negative for discharge and redness.  Respiratory:  Negative for cough, sputum production, shortness of breath and wheezing.   Cardiovascular:  Negative for chest pain, palpitations, orthopnea, claudication, leg swelling and PND.  Gastrointestinal:  Negative for abdominal pain, heartburn, nausea and  vomiting.  Musculoskeletal:  Positive for back pain, falls and joint pain. Negative for myalgias.  Skin:  Negative for rash.  Neurological:  Positive for dizziness and weakness. Negative for tingling, tremors, sensory change, speech change, focal weakness and loss of consciousness.       Nightmares (occur in < 2% of patients on Cardizem) Vertigo  Endo/Heme/Allergies:  Does not bruise/bleed easily.  Psychiatric/Behavioral:  Negative for substance abuse. The patient is not nervous/anxious.   All other systems reviewed and are negative.   EKGs/Labs/Other Studies Reviewed:    Studies reviewed were summarized above. The additional studies were reviewed today:  Zio patch 03/2021: The patient was monitored for 14 days. The predominant rhythm was sinus with an average rate of 55 bpm (range 43-96 bpm and sinus). There were occasional PACs (3.9% burden) and rare PVCs. 4 atrial runs lasting up to 7 beats with a maximum rate of 148 bpm were observed. No sustained arrhythmia or prolonged pause was seen. Patient triggered events correspond to sinus rhythm, PACs, and PVCs.   Predominantly sinus rhythm with occasional PACs and rare PVCs.  A few brief episodes of PSVT also noted.   EKG:  EKG is ordered today.  The EKG ordered today demonstrates NSR, 66 bpm, frequent PACs in a pattern of atrial bigeminy, no acute ST-T changes  Recent Labs: 08/07/2020: ALT 9 03/07/2021: Hemoglobin 13.1; Platelets 228; TSH 1.570 03/25/2021: Magnesium 2.0 04/04/2021: BUN 24; Creatinine, Ser 0.87; Potassium 4.5; Sodium 136  Recent Lipid Panel    Component Value Date/Time   CHOL 156 01/24/2016 0920   TRIG 86 01/24/2016 0920   HDL 45 01/24/2016 0920   CHOLHDL 3.5 01/24/2016 0920   LDLCALC 94 01/24/2016 0920    PHYSICAL EXAM:    VS:  BP 136/70 (BP Location: Left Arm, Patient Position: Sitting, Cuff Size: Large)   Pulse 66   Ht 5\' 4"  (1.626 m)   Wt 262 lb 2 oz (118.9 kg)   SpO2 98%   BMI 44.99 kg/m   BMI: Body  mass index is 44.99 kg/m.  Physical Exam Vitals reviewed.  Constitutional:      Appearance: She is well-developed.  HENT:     Head: Normocephalic and atraumatic.  Eyes:     General:  Right eye: No discharge.        Left eye: No discharge.  Neck:     Vascular: No JVD.  Cardiovascular:     Rate and Rhythm: Normal rate and regular rhythm.     Pulses:          Posterior tibial pulses are 2+ on the right side and 2+ on the left side.     Heart sounds: Normal heart sounds, S1 normal and S2 normal. Heart sounds not distant. No midsystolic click and no opening snap. No murmur heard.   No friction rub.  Pulmonary:     Effort: Pulmonary effort is normal. No respiratory distress.     Breath sounds: Normal breath sounds. No decreased breath sounds, wheezing or rales.  Chest:     Chest wall: No tenderness.  Abdominal:     General: There is no distension.     Palpations: Abdomen is soft.     Tenderness: There is no abdominal tenderness.  Musculoskeletal:     Cervical back: Normal range of motion.     Right lower leg: No edema.     Left lower leg: No edema.  Skin:    General: Skin is warm and dry.     Nails: There is no clubbing.  Neurological:     Mental Status: She is alert and oriented to person, place, and time.  Psychiatric:        Speech: Speech normal.        Behavior: Behavior normal.        Thought Content: Thought content normal.        Judgment: Judgment normal.    Wt Readings from Last 3 Encounters:  06/25/21 262 lb 2 oz (118.9 kg)  05/20/21 269 lb 2 oz (122.1 kg)  04/04/21 271 lb (122.9 kg)     Orthostatic vital signs: Lying: 145/86, 70 bpm Sitting: 141/70, 70 bpm, dizzy Standing: 135/78, 78 bpm Standing time 3 minutes: 142/74, 79 bpm  ASSESSMENT & PLAN:   HTN: Blood pressure is reasonably controlled in the office today at 136/70.  With noted xerostomia and difficulty in splitting the pill, we will discontinue chlorthalidone.  Start losartan 25 mg  daily.  Check BMP 1 week after starting ARB.  Atrial tachycardia/PACs: Prior outpatient cardiac monitoring showed no significant sustained arrhythmias.  Intolerance to Bystolic secondary to profound fatigue.  Did not tolerate Cardizem secondary to nightmares.  Given lack of sustained arrhythmias, and noted improvement in symptoms following improvement in underlying anxiety, we have elected to discontinue Cardizem CD and not place a standing AV nodal blocking medication in its place.  She will take short acting diltiazem 30 mg 3 times daily as needed for sustained tachypalpitations.  If needed, moving forward, could consider trial of alternative beta-blocker such as low-dose carvedilol.  Check BMP, TSH, magnesium, and CBC, to be drawn in 1 week to minimize number of venipunctures.  Morbid obesity: Weight loss is encouraged through heart healthy diet and regular exercise.  Fatigue: Improved off beta-blocker.  Hyperglycemia: Check A1c at her request.  Dizziness: Appears to be consistent with vertigo.  Orthostatics normal in the office.  She will discuss with PCP at her next visit  Disposition: F/u with Dr. Okey Dupre or an APP in 1 month.   Medication Adjustments/Labs and Tests Ordered: Current medicines are reviewed at length with the patient today.  Concerns regarding medicines are outlined above. Medication changes, Labs and Tests ordered today are summarized above and listed in the Patient  Instructions accessible in Encounters.   Signed, Eula Listen, PA-C 06/25/2021 1:11 PM     [facility] [place] [address] [place], [place] [address] [phone]

## 2021-06-25 ENCOUNTER — Encounter: Payer: Self-pay | Admitting: Physician Assistant

## 2021-06-25 ENCOUNTER — Other Ambulatory Visit: Payer: Self-pay

## 2021-06-25 ENCOUNTER — Ambulatory Visit: Payer: Medicare PPO | Admitting: Physician Assistant

## 2021-06-25 VITALS — BP 136/70 | HR 66 | Ht 64.0 in | Wt 262.1 lb

## 2021-06-25 DIAGNOSIS — R002 Palpitations: Secondary | ICD-10-CM

## 2021-06-25 DIAGNOSIS — I491 Atrial premature depolarization: Secondary | ICD-10-CM | POA: Diagnosis not present

## 2021-06-25 DIAGNOSIS — R739 Hyperglycemia, unspecified: Secondary | ICD-10-CM

## 2021-06-25 DIAGNOSIS — R5383 Other fatigue: Secondary | ICD-10-CM

## 2021-06-25 DIAGNOSIS — I471 Supraventricular tachycardia: Secondary | ICD-10-CM

## 2021-06-25 DIAGNOSIS — R42 Dizziness and giddiness: Secondary | ICD-10-CM | POA: Diagnosis not present

## 2021-06-25 DIAGNOSIS — I10 Essential (primary) hypertension: Secondary | ICD-10-CM

## 2021-06-25 MED ORDER — DILTIAZEM HCL 30 MG PO TABS: 30.0000 mg | ORAL_TABLET | Freq: Two times a day (BID) | ORAL | 3 refills | Status: DC | PRN

## 2021-06-25 MED ORDER — LOSARTAN POTASSIUM 25 MG PO TABS: 25.0000 mg | ORAL_TABLET | Freq: Every day | ORAL | 3 refills | Status: DC

## 2021-06-25 NOTE — Patient Instructions (Addendum)
Medication Instructions:  Your physician has recommended you make the following change in your medication:   STOP Chlorthalidone STOP Diltiazem (Cardizem LA) START Losartan 25 mg once daily START Diltiazem 30 mg three times a day as needed for fast heart rates of palpitations.   *If you need a refill on your cardiac medications before your next appointment, please call your pharmacy*   Lab Work: BMET, TSH, CBC, MAG, and A1C in one week (August 17 th)   No appointment is needed. Go to Medical Mall Entrance at [facility] and check in at 1st desk on the right to check in, past the screening table Lab hours: Monday- Friday (7:30 am- 5:30 pm)  If you have labs (blood work) drawn today and your tests are completely normal, you will receive your results only by: MyChart Message (if you have MyChart) OR A paper copy in the mail If you have any lab test that is abnormal or we need to change your treatment, we will call you to review the results.   Testing/Procedures: None   Follow-Up: At [facility], you and your health needs are our priority.  As part of our continuing mission to provide you with exceptional heart care, we have created designated Provider Care Teams.  These Care Teams include your primary Cardiologist (physician) and Advanced Practice Providers (APPs -  Physician Assistants and Nurse Practitioners) who all work together to provide you with the care you need, when you need it.   Your next appointment:   1 month(s)  The format for your next appointment:   In Person  Provider:   Yvonne Kendall, MD or Eula Listen, PA-C

## 2021-07-02 ENCOUNTER — Other Ambulatory Visit
Admission: RE | Admit: 2021-07-02 | Discharge: 2021-07-02 | Disposition: A | Discharge status: Home / Self Care | Payer: Medicare PPO | Attending: Physician Assistant | Admitting: Physician Assistant

## 2021-07-02 DIAGNOSIS — I491 Atrial premature depolarization: Secondary | ICD-10-CM | POA: Diagnosis not present

## 2021-07-02 DIAGNOSIS — I471 Supraventricular tachycardia: Secondary | ICD-10-CM | POA: Insufficient documentation

## 2021-07-02 DIAGNOSIS — R002 Palpitations: Secondary | ICD-10-CM | POA: Diagnosis not present

## 2021-07-02 DIAGNOSIS — R739 Hyperglycemia, unspecified: Secondary | ICD-10-CM | POA: Insufficient documentation

## 2021-07-02 LAB — BASIC METABOLIC PANEL
Anion gap: 8 (ref 5–15)
BUN: 17 mg/dL (ref 8–23)
CO2: 32 mmol/L (ref 22–32)
Calcium: 8.8 mg/dL — ABNORMAL LOW (ref 8.9–10.3)
Chloride: 94 mmol/L — ABNORMAL LOW (ref 98–111)
Creatinine, Ser: 0.83 mg/dL (ref 0.44–1.00)
GFR, Estimated: >60 mL/min (ref >60)
Glucose, Bld: 105 mg/dL — ABNORMAL HIGH (ref 70–99)
Potassium: 4.2 mmol/L (ref 3.5–5.1)
Sodium: 134 mmol/L — ABNORMAL LOW (ref 135–145)

## 2021-07-02 LAB — TSH: TSH: 1.6 u[IU]/mL (ref 0.350–4.500)

## 2021-07-02 LAB — CBC
HCT: 40.1 % (ref 36.0–46.0)
Hemoglobin: 13.5 g/dL (ref 12.0–15.0)
MCH: 31.4 pg (ref 26.0–34.0)
MCHC: 33.7 g/dL (ref 30.0–36.0)
MCV: 93.3 fL (ref 80.0–100.0)
Platelets: 261 10*3/uL (ref 150–400)
RBC: 4.3 MIL/uL (ref 3.87–5.11)
RDW: 13.5 % (ref 11.5–15.5)
WBC: 8.3 10*3/uL (ref 4.0–10.5)
nRBC: 0 % (ref 0.0–0.2)

## 2021-07-02 LAB — HEMOGLOBIN A1C
Hgb A1c MFr Bld: 5.5 % (ref 4.8–5.6)
Mean Plasma Glucose: 111.15 mg/dL

## 2021-07-02 LAB — MAGNESIUM: Magnesium: 2.2 mg/dL (ref 1.7–2.4)

## 2021-07-26 NOTE — Progress Notes (Signed)
Cardiology Office Note    Date:  07/31/2021   ID:  Christie Hanson, DOB 11/09/1946, MRN [id number]  PCP:  Alba Cory, MD  Cardiologist:  Yvonne Kendall, MD  Electrophysiologist:  None   Chief Complaint: Follow-up  History of Present Illness:   Christie Hanson is a 74 y.o. female with history of HTN, atrial tachycardia, PACs, hyperglycemia, asthma, vertigo, GERD, depression, and obesity who presents for follow up of HTN.   She was evaluated as a new patient by Dr. Okey Dupre on 04/04/2021 at the request of her PCP for hypertension. At that visit, she noted elevated BP was just recently diagnosed when she presented to her orthopedist for joint injections, though did report, in hindsight, her BPs had been elevated for several months at home. She had been seen in the ED in 02/2021 with elevated BP of > 200 mmHg at home with reassuring work up and recommendation for her to follow up with her PCP. She also noted sporadic palpitations with her heart rate increasing up to 140 bpm, with episodes usually lasting 15 seconds in duration, with 1 episode lasting approximately 40 minutes before spontaneous resolution. She noted, in the past, her palpitations were associated with excess caffeine intake, though this had not been the case more recently. With regards to her BP, she had been recently started on HCTZ and Bystolic by her PCP in early 03/2021, with subsequent development of fatigue and heart rates in the 50s bpm at times. At her visit with Dr. Okey Dupre, her BP was reasonably controlled at 136/70 with a heart rate of 59 bpm. Her Bystolic was decreased to 5 mg and sodium restriction was advised. Subsequent outpatient cardiac monitoring showed a predominant rhythm of sinus with an average heart rate of 55 bpm (range 43-96 bpm in sinus), occasional PACs representing a 3.9% burden, rare PVCs, 4 atrial runs lasting up to 7 beats with a maximum rate of 148 bpm, and no sustained arrhythmias or prolonged pauses  noted. Patient triggered events corresponded to sinus rhythm, PACs, and PVCs.    She was seen in follow-up on 05/20/2021, and continued to note significant fatigue and somnolence since initiating Bystolic.  She reported prior to initiating Bystolic, her activity level was much better.  Home BPs were in the 1 teens to 130s over 60s to 70s.  Her palpitations remain largely unchanged despite cessation of caffeine.  Given continued fatigue, Bystolic was discontinued.  She was placed on Cardizem LA 120 mg daily.  She was last seen in the office in 06/2021 and noted improvement in her underlying fatigue following discontinuation of beta-blocker.  She did feel like Cardizem has helped with her palpitations, however since initiating this medication she noted an increase in nightmares and difficulty falling asleep with noted mind racing.  She also noted some lower extremity swelling following the initiation of Cardizem.  There was an increase in her vertigo.  BP at home has been well controlled in the 130s to 140s mmHg systolic with heart rates in the 60s to 70s bpm off beta-blocker.  She noted xerostomia with chlorthalidone.  Given nightmares, Cardizem was discontinued with recommendation for watchful waiting regarding palpitations and recommendation for her to take as needed short acting diltiazem for sustained tachypalpitations.  With noted xerostomia, chlorthalidone was discontinued and she was started on losartan 25 mg.  She comes in doing well from a cardiac perspective.  Since she was last seen she has been under increased stress at home with the help  of several family members.  With this, she has noted an uptrend in her BP.  Home BP readings were initially in the 130s to 140s systolic, however over the past several days, with increased stress, she has noted her BP in the 140s to 150s systolic.  She has not needed any as needed diltiazem.  She has tolerated the addition of losartan without issues.  With the  discontinuation of chlorthalidone on a daily basis, she has noted resolution of her xerostomia, though has put on approximately 4 pounds since she was last seen.  She does not have any chest pain, dyspnea, palpitations, dizziness, presyncope, or syncope.  Overall, she is pleased with her improvement.     Labs independently reviewed: 06/2021 - potassium 4.2, BUN 17, serum creatinine 0.83, TSH normal, Hgb 13.5, PLT 261, magnesium 2.2, A1c 5.5 07/2020 - albumin 4.3, AST/ALT normal 01/2016 - TC 156, TG 86, HDL 45, LDL 94    Past Medical History:  Diagnosis Date   Allergy    Asthma    wheezing   Blurred vision    Candidiasis of mouth    Depression    hx of   GERD (gastroesophageal reflux disease)    Herniated disc, cervical    History of carpal tunnel release    Hyperglycemia    Metabolic syndrome    Obesity    Osteoarthritis    Ovarian deficiency    Vitamin D deficiency     Past Surgical History:  Procedure Laterality Date   ABDOMINAL HYSTERECTOMY  1991   BILATERAL SALPINGOOPHORECTOMY     CARPAL TUNNEL RELEASE Bilateral [id number]   [facility]   CATARACT EXTRACTION W/PHACO Left 09/23/2015   Procedure: CATARACT EXTRACTION PHACO AND INTRAOCULAR LENS PLACEMENT (IOC);  Surgeon: Lockie Molahadwick Brasington, MD;  Location: [facility] ORS;  Service: Ophthalmology;  Laterality: Left;  US 00:57 AP% 12.2 6.98 CDE Fluid pack lot # [id number]   CATARACT EXTRACTION W/PHACO Right 10/17/2015   Procedure: CATARACT EXTRACTION PHACO AND INTRAOCULAR LENS PLACEMENT (IOC);  Surgeon: Lockie Molahadwick Brasington, MD;  Location: [facility] ORS;  Service: Ophthalmology;  Laterality: Right;  US  00:30.5 AP   00:47.2 CDE  6.05 casette lot #[id number]   DandC     DILATION AND CURETTAGE OF UTERUS  1990   HERNIA REPAIR  1992   ventral    Current Medications: Current Meds  Medication Sig   acetaminophen (TYLENOL) 500 MG tablet Take 1,000 mg by mouth 2 (two) times daily.   albuterol (PROAIR HFA) 108 (90 Base) MCG/ACT inhaler INHALE TWO PUFFS  EVERY 6 HOURS AS NEEDEDWHEEZING   Cholecalciferol (VITAMIN D) 2000 UNITS tablet Take 1 tablet by mouth daily. Bedtime   diltiazem (CARDIZEM) 30 MG tablet Take 1 tablet (30 mg total) by mouth 2 (two) times daily as needed (As needed for fast heart rates or palpitations).   esomeprazole (NEXIUM) 40 MG capsule Take 1 capsule (40 mg total) by mouth daily.   famotidine (PEPCID) 40 MG tablet Take 40 mg by mouth daily.   FLUoxetine (PROZAC) 20 MG tablet Take 1 tablet (20 mg total) by mouth daily.   losartan (COZAAR) 25 MG tablet Take 1 tablet (25 mg total) by mouth daily.   Multiple Vitamins-Minerals (ICAPS AREDS 2) CAPS Take 2 capsules by mouth daily.    Probiotic Product (UP4 PROBIOTICS WOMENS PO) Take by mouth daily.   sucralfate (CARAFATE) 1 g tablet Take 1 g by mouth 4 (four) times daily as needed. With meals   vitamin B-12 (CYANOCOBALAMIN) 1000 MCG tablet  Take 1,000 mcg by mouth daily. Bedtime   [DISCONTINUED] chlorthalidone (HYGROTON) 25 MG tablet Take 0.5 tablets (12.5 mg total) by mouth daily.    Allergies:   Duloxetine, Morphine, Penicillins, and Sulfa antibiotics   Social History   Socioeconomic History   Marital status: Married    Spouse name: Maisie Fus   Number of children: 4   Years of education: Not on file   Highest education level: Associate degree: occupational, Scientist, product/process development, or vocational program  Occupational History   Occupation: Retired  Tobacco Use   Smoking status: Never   Smokeless tobacco: Never   Tobacco comments:    smoking cessation materials not required  Vaping Use   Vaping Use: Never used  Substance and Sexual Activity   Alcohol use: No    Alcohol/week: 0.0 standard drinks   Drug use: No   Sexual activity: Yes    Partners: Male  Other Topics Concern   Not on file  Social History Narrative   Not on file   Social Determinants of Health   Financial Resource Strain: Not on file  Food Insecurity: Not on file  Transportation Needs: Not on file  Physical  Activity: Not on file  Stress: Not on file  Social Connections: Not on file     Family History:  The patient's family history includes CVA in her father; Cancer in her brother and father; Depression in her father; Healthy in her sister; Hypertension in her mother.  ROS:   Review of Systems  Constitutional:  Positive for malaise/fatigue. Negative for chills, diaphoresis, fever and weight loss.  HENT:  Negative for congestion.   Eyes:  Negative for discharge and redness.  Respiratory:  Negative for cough, sputum production, shortness of breath and wheezing.   Cardiovascular:  Negative for chest pain, palpitations, orthopnea, claudication, leg swelling and PND.  Gastrointestinal:  Negative for abdominal pain, heartburn, nausea and vomiting.  Musculoskeletal:  Negative for falls and myalgias.  Skin:  Negative for rash.  Neurological:  Negative for dizziness, tingling, tremors, sensory change, speech change, focal weakness, loss of consciousness and weakness.  Endo/Heme/Allergies:  Does not bruise/bleed easily.  Psychiatric/Behavioral:  Negative for substance abuse. The patient is not nervous/anxious.   All other systems reviewed and are negative.   EKGs/Labs/Other Studies Reviewed:    Studies reviewed were summarized above. The additional studies were reviewed today:  Zio patch 03/2021: The patient was monitored for 14 days. The predominant rhythm was sinus with an average rate of 55 bpm (range 43-96 bpm and sinus). There were occasional PACs (3.9% burden) and rare PVCs. 4 atrial runs lasting up to 7 beats with a maximum rate of 148 bpm were observed. No sustained arrhythmia or prolonged pause was seen. Patient triggered events correspond to sinus rhythm, PACs, and PVCs.   Predominantly sinus rhythm with occasional PACs and rare PVCs.  A few brief episodes of PSVT also noted.   EKG:  EKG is not ordered today.    Recent Labs: 08/07/2020: ALT 9 07/02/2021: BUN 17; Creatinine, Ser  0.83; Hemoglobin 13.5; Magnesium 2.2; Platelets 261; Potassium 4.2; Sodium 134; TSH 1.600  Recent Lipid Panel    Component Value Date/Time   CHOL 156 01/24/2016 0920   TRIG 86 01/24/2016 0920   HDL 45 01/24/2016 0920   CHOLHDL 3.5 01/24/2016 0920   LDLCALC 94 01/24/2016 0920    PHYSICAL EXAM:    VS:  BP (!) 160/90 (BP Location: Right Arm, Patient Position: Sitting, Cuff Size: Large)   Pulse 63  Ht 5\' 4"  (1.626 m)   Wt 266 lb (120.7 kg)   SpO2 97%   BMI 45.66 kg/m   BMI: Body mass index is 45.66 kg/m.  Physical Exam Vitals reviewed.  Constitutional:      Appearance: She is well-developed.  HENT:     Head: Normocephalic and atraumatic.  Eyes:     General:        Right eye: No discharge.        Left eye: No discharge.  Neck:     Vascular: No JVD.  Cardiovascular:     Rate and Rhythm: Normal rate and regular rhythm.     Pulses:          Posterior tibial pulses are 2+ on the right side and 2+ on the left side.     Heart sounds: Normal heart sounds, S1 normal and S2 normal. Heart sounds not distant. No midsystolic click and no opening snap. No murmur heard.   No friction rub.  Pulmonary:     Effort: Pulmonary effort is normal. No respiratory distress.     Breath sounds: Normal breath sounds. No decreased breath sounds, wheezing or rales.  Chest:     Chest wall: No tenderness.  Abdominal:     General: There is no distension.     Palpations: Abdomen is soft.     Tenderness: There is no abdominal tenderness.  Musculoskeletal:     Cervical back: Normal range of motion.     Right lower leg: No edema.     Left lower leg: No edema.  Skin:    General: Skin is warm and dry.     Nails: There is no clubbing.  Neurological:     Mental Status: She is alert and oriented to person, place, and time.  Psychiatric:        Speech: Speech normal.        Behavior: Behavior normal.        Thought Content: Thought content normal.        Judgment: Judgment normal.    Wt Readings  from Last 3 Encounters:  07/31/21 266 lb (120.7 kg)  06/25/21 262 lb 2 oz (118.9 kg)  05/20/21 269 lb 2 oz (122.1 kg)     ASSESSMENT & PLAN:   Atrial tachycardia/PACs: Quiescent.  She has not needed any as needed short acting diltiazem.  Not currently on beta-blocker or nondihydropyridine calcium channel blocker secondary to bradycardia, fatigue, and night terrors respectively.  Continue to monitor.  Minimization of caffeine encouraged.  HTN: Blood pressure is mildly elevated in the office today, though this appears to be in the setting of increased stress surrounding several family members at home.  We have added back chlorthalidone 12.5 mg every other day.  She will continue losartan 25 mg daily.  In follow-up, if BP remains elevated would titrate losartan to 50 mg daily.  Low-sodium diet recommended.  Fatigue: Resolved following discontinuation of beta-blocker.  Morbid obesity: Weight loss is encouraged through heart healthy diet and regular exercise.   Disposition: F/u with Dr. 07/21/21 or an APP in 1 month to reassess blood pressure.   Medication Adjustments/Labs and Tests Ordered: Current medicines are reviewed at length with the patient today.  Concerns regarding medicines are outlined above. Medication changes, Labs and Tests ordered today are summarized above and listed in the Patient Instructions accessible in Encounters.   Signed, Okey Dupre, PA-C 07/31/2021 12:39 PM     [facility] [place] [address]  [place], [place] [address] [phone]

## 2021-07-31 ENCOUNTER — Encounter: Payer: Self-pay | Admitting: Physician Assistant

## 2021-07-31 ENCOUNTER — Other Ambulatory Visit: Payer: Self-pay

## 2021-07-31 ENCOUNTER — Ambulatory Visit: Payer: Medicare PPO | Admitting: Physician Assistant

## 2021-07-31 VITALS — BP 160/90 | HR 63 | Ht 64.0 in | Wt 266.0 lb

## 2021-07-31 DIAGNOSIS — I10 Essential (primary) hypertension: Secondary | ICD-10-CM | POA: Diagnosis not present

## 2021-07-31 DIAGNOSIS — I471 Supraventricular tachycardia: Secondary | ICD-10-CM | POA: Diagnosis not present

## 2021-07-31 DIAGNOSIS — I491 Atrial premature depolarization: Secondary | ICD-10-CM

## 2021-07-31 DIAGNOSIS — R002 Palpitations: Secondary | ICD-10-CM | POA: Diagnosis not present

## 2021-07-31 DIAGNOSIS — R5383 Other fatigue: Secondary | ICD-10-CM | POA: Diagnosis not present

## 2021-07-31 MED ORDER — CHLORTHALIDONE 25 MG PO TABS: 12.5000 mg | ORAL_TABLET | Freq: Every day | ORAL | 3 refills | Status: DC

## 2021-07-31 MED ORDER — CHLORTHALIDONE 25 MG PO TABS: 12.5000 mg | ORAL_TABLET | ORAL | 3 refills | Status: DC

## 2021-07-31 NOTE — Patient Instructions (Signed)
Medication Instructions:   Your physician has recommended you make the following change in your medication:   START Chlorthalidone 12.5 mg EVERY OTHER DAY  *If you need a refill on your cardiac medications before your next appointment, please call your pharmacy*   Lab Work:  None ordered  Testing/Procedures:  None ordered   Follow-Up: At [facility], you and your health needs are our priority.  As part of our continuing mission to provide you with exceptional heart care, we have created designated Provider Care Teams.  These Care Teams include your primary Cardiologist (physician) and Advanced Practice Providers (APPs -  Physician Assistants and Nurse Practitioners) who all work together to provide you with the care you need, when you need it.  We recommend signing up for the patient portal called "MyChart".  Sign up information is provided on this After Visit Summary.  MyChart is used to connect with patients for Virtual Visits (Telemedicine).  Patients are able to view lab/test results, encounter notes, upcoming appointments, etc.  Non-urgent messages can be sent to your provider as well.   To learn more about what you can do with MyChart, go to [email or web address].    Your next appointment:   1 month(s)  The format for your next appointment:   In Person  Provider:   Eula Listen, PA-C

## 2021-08-08 ENCOUNTER — Other Ambulatory Visit: Payer: Self-pay

## 2021-08-08 ENCOUNTER — Ambulatory Visit: Payer: Medicare PPO | Admitting: Family Medicine

## 2021-08-08 ENCOUNTER — Encounter: Payer: Self-pay | Admitting: Family Medicine

## 2021-08-08 VITALS — BP 130/84 | HR 88 | Temp 98.1°F | Resp 16 | Ht 64.0 in | Wt 263.0 lb

## 2021-08-08 DIAGNOSIS — G629 Polyneuropathy, unspecified: Secondary | ICD-10-CM | POA: Diagnosis not present

## 2021-08-08 DIAGNOSIS — K219 Gastro-esophageal reflux disease without esophagitis: Secondary | ICD-10-CM

## 2021-08-08 DIAGNOSIS — Z23 Encounter for immunization: Secondary | ICD-10-CM

## 2021-08-08 DIAGNOSIS — M159 Polyosteoarthritis, unspecified: Secondary | ICD-10-CM

## 2021-08-08 DIAGNOSIS — E559 Vitamin D deficiency, unspecified: Secondary | ICD-10-CM

## 2021-08-08 DIAGNOSIS — I10 Essential (primary) hypertension: Secondary | ICD-10-CM

## 2021-08-08 DIAGNOSIS — J4540 Moderate persistent asthma, uncomplicated: Secondary | ICD-10-CM

## 2021-08-08 DIAGNOSIS — D692 Other nonthrombocytopenic purpura: Secondary | ICD-10-CM

## 2021-08-08 DIAGNOSIS — M8949 Other hypertrophic osteoarthropathy, multiple sites: Secondary | ICD-10-CM | POA: Diagnosis not present

## 2021-08-08 DIAGNOSIS — R739 Hyperglycemia, unspecified: Secondary | ICD-10-CM | POA: Diagnosis not present

## 2021-08-08 DIAGNOSIS — I491 Atrial premature depolarization: Secondary | ICD-10-CM

## 2021-08-08 DIAGNOSIS — M545 Low back pain, unspecified: Secondary | ICD-10-CM

## 2021-08-08 DIAGNOSIS — F330 Major depressive disorder, recurrent, mild: Secondary | ICD-10-CM | POA: Diagnosis not present

## 2021-08-08 DIAGNOSIS — E538 Deficiency of other specified B group vitamins: Secondary | ICD-10-CM

## 2021-08-08 DIAGNOSIS — G8929 Other chronic pain: Secondary | ICD-10-CM

## 2021-08-08 MED ORDER — ESOMEPRAZOLE MAGNESIUM 40 MG PO CPDR: 40.0000 mg | DELAYED_RELEASE_CAPSULE | Freq: Every day | ORAL | 1 refills | Status: DC

## 2021-08-08 MED ORDER — FLUOXETINE HCL 20 MG PO TABS: 20.0000 mg | ORAL_TABLET | Freq: Every day | ORAL | 1 refills | Status: DC

## 2021-08-08 MED ORDER — FAMOTIDINE 40 MG PO TABS: 40.0000 mg | ORAL_TABLET | Freq: Every day | ORAL | 1 refills | Status: DC

## 2021-08-08 MED ORDER — ASMANEX (60 METERED DOSES) 220 MCG/INH IN AEPB: 2.0000 | INHALATION_SPRAY | Freq: Every day | RESPIRATORY_TRACT | 12 refills | Status: DC

## 2021-08-08 MED ORDER — SUCRALFATE 1 G PO TABS: 1.0000 g | ORAL_TABLET | Freq: Three times a day (TID) | ORAL | 0 refills | Status: DC

## 2021-08-08 MED ORDER — MONTELUKAST SODIUM 10 MG PO TABS: 10.0000 mg | ORAL_TABLET | Freq: Every day | ORAL | 1 refills | Status: DC

## 2021-08-08 MED ORDER — BACLOFEN 10 MG PO TABS: 10.0000 mg | ORAL_TABLET | Freq: Three times a day (TID) | ORAL | 0 refills | Status: DC | PRN

## 2021-08-08 NOTE — Progress Notes (Signed)
Name: Christie Hanson   MRN: [id number]    DOB: 1945/11/28   Date:08/08/2021       Progress Note  Subjective  Chief Complaint  Follow Up  HPI  Metabolic Syndrome: last hgbA1C was 5.5%, she denies polyphagia, polydipsia or polyuria.   Hypertension Benign: she could not tolerate Bystolic, she is now on Losartan and chlorthalidone half a pill every other day , on cardizem only prn for palpitation and not very often. BP is at goal   Zio patch 03/2021: The patient was monitored for 14 days. The predominant rhythm was sinus with an average rate of 55 bpm (range 43-96 bpm and sinus). There were occasional PACs (3.9% burden) and rare PVCs. 4 atrial runs lasting up to 7 beats with a maximum rate of 148 bpm were observed. No sustained arrhythmia or prolonged pause was seen. Patient triggered events correspond to sinus rhythm, PACs, and PVCs.   Predominantly sinus rhythm with occasional PACs and rare PVCs.  A few brief episodes of PSVT also noted.   Asthma Moderate:She was taking Breo daily but since seen by cardiologist for palpiation she stopped taking medication. She woul dlike to resume Singulair, we will also try switching to Asmanex and avoid beta agonist component she has nocturnal wheezing since stopped Breo, SOB with activity is stable.    GERD: she was seen for worsening of Symptoms one year ago, she was given 6 weeks of carafate, PPI and H2 blockers, she has been using Carafate prn since and needs a refill. She has changed her diet, denies pain or lack of appetite, no blood in stools, but has regurgitation. Discussed referral to GI to rule out an ulcer but she states she does not have time a this time and wants to hold off.    Neuropathy: she states she has burning sensation on both feet for many years, she is on B12 supplementation, she states symptoms have been under control . Unchanged    Meralgia paresthetica: it was present for many years, however this Summer the pain was daily  and very intense, burning like , right lateral thigh, not associated with weakness, she states pain is a 3-4/10 even without duloxetine    Depression: she stopped taking all medication back in 2017.Took Prozac and Abilify for many years prior to that . She stopped because of side effects, like tremors Tried cymbalta but it made her feel "weird" and stopped medication, it made her feel anxious, however decided to go back on medication in 07/2018 because of meralgia paresthetica , she stopped all medications again in 2020 again, she states she is snappy at times but not daily. She is under a lot of stress with family members being sick, but Prozac has helped.    Obesity: she had  lost 10  lbs before last visit, lost one pound since last visit  , she has changed her diet, drinking more water, eating more fruit and vegetables. Doing well    Vitamin D and Vitamin B12 deficiency: back to normal still on supplementation. We will check B12 but not vitamin D because of cost    OA: she  has been less active and pain on both knees, currently seeing Dr. Landry Mellow at [facility], she had hyaluronic acid injections and helped a little initially, but has been off NSAID's and has been taking Tylenol BID   Senile purpura: both arms, reassurance.   Chronic low back pain:  with a flare, she states tripped and fell at home  in July and again about one month ago since that time she has noticed dull ache on lumbar spine, no radiculitis , denies bowel or bladder incontinence   Vertigo: states when she fell backwards from a lawn chair, she hit her head on the grass, she did not develop headaches , she has a history of BPV and she noticed a flare that evening when she went to bed, she tried home Epley maneuver but caused nausea, she does not want to see ENT at this time   Patient Active Problem List   Diagnosis Date Noted   Essential hypertension 04/06/2021   Palpitations 04/06/2021   BMI 45.0-49.9, adult (HCC)  02/23/2020   Senile purpura (HCC) 03/14/2018   Age-related macular degeneration 03/14/2018   Morbid obesity (HCC) 03/14/2018   Peripheral polyneuropathy 03/02/2017   Vitamin B12 deficiency 07/29/2015   Carpal tunnel syndrome 07/27/2015   Osteoarthritis 07/27/2015   Depression, major, recurrent, mild (HCC) 07/27/2015   Gastro-esophageal reflux disease without esophagitis 07/27/2015   Displacement of cervical intervertebral disc without myelopathy 07/27/2015   Blood glucose elevated 07/27/2015   Dysmetabolic syndrome 07/27/2015   Vitamin D deficiency 07/27/2015   Sprain of rotator cuff capsule 07/27/2015   Urgency of urination 07/27/2015   Allergic rhinitis 07/27/2015   Asthma, moderate persistent 07/27/2015    Past Surgical History:  Procedure Laterality Date   ABDOMINAL HYSTERECTOMY  1991   BILATERAL SALPINGOOPHORECTOMY     CARPAL TUNNEL RELEASE Bilateral [id number]   [facility]   CATARACT EXTRACTION W/PHACO Left 09/23/2015   Procedure: CATARACT EXTRACTION PHACO AND INTRAOCULAR LENS PLACEMENT (IOC);  Surgeon: Lockie Mola, MD;  Location: [facility] ORS;  Service: Ophthalmology;  Laterality: Left;  [place] 00:57 AP% 12.2 6.98 CDE Fluid pack lot # [id number]   CATARACT EXTRACTION W/PHACO Right 10/17/2015   Procedure: CATARACT EXTRACTION PHACO AND INTRAOCULAR LENS PLACEMENT (IOC);  Surgeon: Lockie Mola, MD;  Location: [facility] ORS;  Service: Ophthalmology;  Laterality: Right;  [place]  00:30.5 AP   00:47.2 CDE  6.05 casette lot #[id number]   DandC     DILATION AND CURETTAGE OF UTERUS  1990   HERNIA REPAIR  1992   ventral    Family History  Problem Relation Age of Onset   Hypertension Mother    Cancer Father        prostate   CVA Father    Depression Father    Healthy Sister    Cancer Brother        prostate    Social History   Tobacco Use   Smoking status: Never   Smokeless tobacco: Never   Tobacco comments:    smoking cessation materials not required  Substance Use Topics    Alcohol use: No    Alcohol/week: 0.0 standard drinks     Current Outpatient Medications:    acetaminophen (TYLENOL) 500 MG tablet, Take 1,000 mg by mouth 2 (two) times daily., Disp: , Rfl:    albuterol (PROAIR HFA) 108 (90 Base) MCG/ACT inhaler, INHALE TWO PUFFS EVERY 6 HOURS AS NEEDEDWHEEZING, Disp: 8.5 g, Rfl: 1   chlorthalidone (HYGROTON) 25 MG tablet, Take 0.5 tablets (12.5 mg total) by mouth every other day., Disp: 7.5 tablet, Rfl: 3   Cholecalciferol (VITAMIN D) 2000 UNITS tablet, Take 1 tablet by mouth daily. Bedtime, Disp: , Rfl:    diltiazem (CARDIZEM) 30 MG tablet, Take 1 tablet (30 mg total) by mouth 2 (two) times daily as needed (As needed for fast heart rates or palpitations)., Disp: 90 tablet, Rfl: 3  esomeprazole (NEXIUM) 40 MG capsule, Take 1 capsule (40 mg total) by mouth daily., Disp: 90 capsule, Rfl: 1   famotidine (PEPCID) 40 MG tablet, Take 40 mg by mouth daily., Disp: , Rfl:    FLUoxetine (PROZAC) 20 MG tablet, Take 1 tablet (20 mg total) by mouth daily., Disp: 90 tablet, Rfl: 1   losartan (COZAAR) 25 MG tablet, Take 1 tablet (25 mg total) by mouth daily., Disp: 90 tablet, Rfl: 3   Multiple Vitamins-Minerals (ICAPS AREDS 2) CAPS, Take 2 capsules by mouth daily. , Disp: , Rfl:    Probiotic Product (UP4 PROBIOTICS WOMENS PO), Take by mouth daily., Disp: , Rfl:    sucralfate (CARAFATE) 1 g tablet, Take 1 g by mouth 4 (four) times daily as needed. With meals, Disp: , Rfl:    vitamin B-12 (CYANOCOBALAMIN) 1000 MCG tablet, Take 1,000 mcg by mouth daily. Bedtime, Disp: , Rfl:   Allergies  Allergen Reactions   Duloxetine     Mental fogginess    Morphine    Penicillins    Sulfa Antibiotics     I personally reviewed active problem list, medication list, allergies, family history, social history, health maintenance with the patient/caregiver today.   ROS  Constitutional: Negative for fever or weight change.  Respiratory: Negative for cough and shortness of breath.    Cardiovascular: Negative for chest pain or palpitations.  Gastrointestinal: Negative for abdominal pain, no bowel changes.  Musculoskeletal: Negative for gait problem or joint swelling.  Skin: Negative for rash.  Neurological: Negative for dizziness or headache.  No other specific complaints in a complete review of systems (except as listed in HPI above).   Objective  Vitals:   08/08/21 1118  BP: 130/84  Pulse: 88  Resp: 16  Temp: 98.1 F (36.7 C)  SpO2: 99%  Weight: 263 lb (119.3 kg)  Height: 5\' 4"  (1.626 m)    Body mass index is 45.14 kg/m.  Physical Exam  Constitutional: Patient appears well-developed and well-nourished. Obese  No distress.  HEENT: head atraumatic, normocephalic, pupils equal and reactive to light, neck supple,normal TM bilaterally  Cardiovascular: Normal rate, regular rhythm and normal heart sounds.  No murmur heard. Non pitting  BLE edema. Pulmonary/Chest: Effort normal and breath sounds normal. No respiratory distress. Abdominal: Soft.  There is no tenderness. Psychiatric: Patient has a normal mood and affect. behavior is normal. Judgment and thought content normal.  Muscular skeletal: using a cane   Recent Results (from the past 2160 hour(s))  HgB A1c     Status: None   Collection Time: 07/02/21  9:56 AM  Result Value Ref Range   Hgb A1c MFr Bld 5.5 4.8 - 5.6 %    Comment: (NOTE) Pre diabetes:          5.7%-6.4%  Diabetes:              >6.4%  Glycemic control for   <7.0% adults with diabetes    Mean Plasma Glucose 111.15 mg/dL    Comment: Performed at [facility], [address], [place], [place]  Magnesium     Status: None   Collection Time: 07/02/21  9:56 AM  Result Value Ref Range   Magnesium 2.2 1.7 - 2.4 mg/dL    Comment: Performed at [facility], [address] [place], [place]  CBC     Status: None   Collection Time: 07/02/21  9:56 AM  Result Value Ref Range   WBC 8.3 4.0 -  10.5 K/uL    RBC 4.30 3.87 - 5.11 MIL/uL   Hemoglobin 13.5 12.0 - 15.0 g/dL   HCT 16.1 09.6 - 04.5 %   MCV 93.3 80.0 - 100.0 fL   MCH 31.4 26.0 - 34.0 pg   MCHC 33.7 30.0 - 36.0 g/dL   RDW 40.9 81.1 - 91.4 %   Platelets 261 150 - 400 K/uL   nRBC 0.0 0.0 - 0.2 %    Comment: Performed at [facility], [address] [place], [place] [address]  TSH     Status: None   Collection Time: 07/02/21  9:56 AM  Result Value Ref Range   TSH 1.600 0.350 - 4.500 uIU/mL    Comment: Performed by a 3rd Generation assay with a functional sensitivity of <=0.01 uIU/mL. Performed at [facility], [address] [place], [place] [address]   Basic metabolic panel     Status: Abnormal   Collection Time: 07/02/21  9:56 AM  Result Value Ref Range   Sodium 134 (L) 135 - 145 mmol/L   Potassium 4.2 3.5 - 5.1 mmol/L   Chloride 94 (L) 98 - 111 mmol/L   CO2 32 22 - 32 mmol/L   Glucose, Bld 105 (H) 70 - 99 mg/dL    Comment: Glucose reference range applies only to samples taken after fasting for at least 8 hours.   BUN 17 8 - 23 mg/dL   Creatinine, Ser 8.65 0.44 - 1.00 mg/dL   Calcium 8.8 (L) 8.9 - 10.3 mg/dL   GFR, Estimated >78 >46 mL/min    Comment: (NOTE) Calculated using the CKD-EPI Creatinine Equation (2021)    Anion gap 8 5 - 15    Comment: Performed at [facility], [address] [place], [place] [address]     PHQ2/9: Depression screen [facility] 2/9 08/08/2021 03/10/2021 02/04/2021 08/07/2020 02/05/2020  Decreased Interest 1 1 0 1 0  Down, Depressed, Hopeless 1 0 0 0 0  PHQ - 2 Score 2 1 0 1 0  Altered sleeping 0 0 0 0 0  Tired, decreased energy Change in appetite 0 0 0 0 0  Feeling bad or failure about yourself  1 0 0 1 0  Trouble concentrating 0 0 0 0 0  Moving slowly or fidgety/restless 0 0 0 0 0  Suicidal thoughts 0 0 0 0 0  PHQ-9 Score Difficult doing work/chores - - - Not difficult at all Somewhat difficult  Some recent data might be hidden    phq 9 is  positive   Fall Risk: Fall Risk  08/08/2021 03/10/2021 02/04/2021 08/07/2020 02/05/2020  Falls in the past year? 1 0 0 0 0  Number falls in past yr: 1 0 0 0 0  Injury with Fall? 1 0 0 0 0  Risk for fall due to : Impaired mobility - - - -  Risk for fall due to: Comment - - - - -  Follow up Falls prevention discussed - - - -      Functional Status Survey: Is the patient deaf or have difficulty hearing?: No Does the patient have difficulty seeing, even when wearing glasses/contacts?: No Does the patient have difficulty concentrating, remembering, or making decisions?: Yes Does the patient have difficulty walking or climbing stairs?: Yes Does the patient have difficulty dressing or bathing?: No Does the patient have difficulty doing errands alone such as visiting a doctor's office or shopping?:  No    Assessment & Plan  1. Senile purpura (HCC)  On arms, reassurance given   2. Depression, major, recurrent, mild (HCC)  - FLUoxetine (PROZAC) 20 MG tablet; Take 1 tablet (20 mg total) by mouth daily.  Dispense: 90 tablet; Refill: 1  3. Primary osteoarthritis involving multiple joints  Using a cane   4. Vitamin B12 deficiency  Continue supplementation   5. Asthma, moderate persistent, well-controlled  She had to stop Breo e to palpitation, we will try Asmanex and add singular, she still has nocturnal wheezing, and sob with activity   6. Gastro-esophageal reflux disease without esophagitis  Discussed referral to GI again, to make sure she does not have an ulcer She has changed her diet, she takes carafate prn only, last fill was one year ago - esomeprazole (NEXIUM) 40 MG capsule; Take 1 capsule (40 mg total) by mouth daily.  Dispense: 90 capsule; Refill: 1 - sucralfate (CARAFATE) 1 g tablet; Take 1 tablet (1 g total) by mouth 4 (four) times daily -  with meals and at bedtime. With meals  Dispense: 90 tablet; Refill: 0 - famotidine (PEPCID) 40 MG tablet; Take 1 tablet (40 mg total)  by mouth at bedtime.  Dispense: 90 tablet; Refill: 1  7. Hyperglycemia  Last A1C was normal   8. Vitamin D deficiency   9. Peripheral polyneuropathy   10. Benign hypertension  At goal today   11. Morbid obesity (HCC)  Discussed with the patient the risk posed by an increased BMI. Discussed importance of portion control, calorie counting and at least 150 minutes of physical activity weekly. Avoid sweet beverages and drink more water. Eat at least 6 servings of fruit and vegetables daily    12. PAC (premature atrial contraction)   13. Chronic bilateral low back pain without sciatica    Two falls since July

## 2021-08-11 ENCOUNTER — Other Ambulatory Visit: Payer: Self-pay | Admitting: Family Medicine

## 2021-08-11 DIAGNOSIS — J4540 Moderate persistent asthma, uncomplicated: Secondary | ICD-10-CM

## 2021-08-11 MED ORDER — ARNUITY ELLIPTA 100 MCG/ACT IN AEPB: 1.0000 | INHALATION_SPRAY | Freq: Every day | RESPIRATORY_TRACT | 5 refills | Status: DC

## 2021-08-30 ENCOUNTER — Other Ambulatory Visit: Payer: Self-pay | Admitting: Family Medicine

## 2021-08-30 DIAGNOSIS — K219 Gastro-esophageal reflux disease without esophagitis: Secondary | ICD-10-CM

## 2021-08-30 NOTE — Telephone Encounter (Signed)
Requested Prescriptions  Pending Prescriptions Disp Refills  . sucralfate (CARAFATE) 1 g tablet [Pharmacy Med Name: SUCRALFATE 1 GM TABLET] 120 tablet 5    Sig: TAKE 1 TABLET (1 G TOTAL) BY MOUTH 4 (FOUR) TIMES DAILY - WITH MEALS AND AT BEDTIME. WITH MEALS     Gastroenterology: Antiacids Passed - 08/30/2021 12:48 AM      Passed - Valid encounter within last 12 months    Recent Outpatient Visits          3 weeks ago Senile purpura [facility])   [facility] Alba Cory, MD   5 months ago Hypertension, unspecified type   [facility] Alba Cory, MD   6 months ago Morbid obesity [facility])   [facility] Alba Cory, MD   1 year ago Asthma, moderate persistent, well-controlled   [facility] Alba Cory, MD   1 year ago Morbid obesity [facility])   [facility] Alba Cory, MD      Future Appointments            In 1 week Dunn, Raymon Mutton, PA-C [facility], [facility]   In 5 months Alba Cory, MD [facility], [facility]

## 2021-09-07 NOTE — Progress Notes (Signed)
Cardiology Office Note    Date:  09/08/2021   ID:  Christie Hanson, DOB [birth year], MRN [id number]  PCP:  Alba Cory, MD  Cardiologist:  Yvonne Kendall, MD  Electrophysiologist:  None   Chief Complaint: Follow up  History of Present Illness:   Christie Hanson is a 75 y.o. female with history of HTN, atrial tachycardia, PACs, hyperglycemia, asthma, vertigo, GERD, depression, and obesity who presents for follow up of HTN.   She was evaluated as a new patient by Dr. Okey Dupre on 04/04/2021 at the request of her PCP for hypertension. At that visit, she noted elevated BP was just recently diagnosed when she presented to her orthopedist for joint injections, though did report, in hindsight, her BPs had been elevated for several months at home. She had been seen in the ED in 02/2021 with elevated BP of > 200 mmHg at home with reassuring work up and recommendation for her to follow up with her PCP. She also noted sporadic palpitations with her heart rate increasing up to 140 bpm, with episodes usually lasting 15 seconds in duration, with 1 episode lasting approximately 40 minutes before spontaneous resolution. She noted, in the past, her palpitations were associated with excess caffeine intake, though this had not been the case more recently. With regards to her BP, she had been recently started on HCTZ and Bystolic by her PCP in early 03/2021, with subsequent development of fatigue and heart rates in the 50s bpm at times. At her visit with Dr. Okey Dupre, her BP was reasonably controlled at 136/70 with a heart rate of 59 bpm. Her Bystolic was decreased to 5 mg and sodium restriction was advised. Subsequent outpatient cardiac monitoring showed a predominant rhythm of sinus with an average heart rate of 55 bpm (range 43-96 bpm in sinus), occasional PACs representing a 3.9% burden, rare PVCs, 4 atrial runs lasting up to 7 beats with a maximum rate of 148 bpm, and no sustained arrhythmias or prolonged pauses  noted. Patient triggered events corresponded to sinus rhythm, PACs, and PVCs.    She was seen in follow-up on 05/20/2021, and continued to note significant fatigue and somnolence since initiating Bystolic.  She reported prior to initiating Bystolic, her activity level was much better.  Home BPs were in the 1 teens to 130s over 60s to 70s.  Her palpitations remain largely unchanged despite cessation of caffeine.  Given continued fatigue, Bystolic was discontinued.  She was placed on Cardizem LA 120 mg daily.   She was seen in the office in 06/2021 and noted improvement in her underlying fatigue following discontinuation of beta-blocker.  She did feel like Cardizem has helped with her palpitations, however since initiating this medication she noted an increase in nightmares and difficulty falling asleep with noted mind racing.  She also noted some lower extremity swelling following the initiation of Cardizem.  There was an increase in her vertigo.  BP at home has been well controlled in the 130s to 140s mmHg systolic with heart rates in the 60s to 70s bpm off beta-blocker.  She noted xerostomia with chlorthalidone.  Given nightmares, Cardizem was discontinued with recommendation for watchful waiting regarding palpitations and recommendation for her to take as needed short acting diltiazem for sustained tachypalpitations.  With noted xerostomia, chlorthalidone was discontinued and she was started on losartan 25 mg.  She was last seen in the office on 07/31/2021 and was doing well from a cardiac perspective.  She was under increased stress at home  with the help of several family members.  With this, she noted an uptrend in her BP into the 140s to 150s systolic.  She had not needed any as needed diltiazem.  She was tolerating losartan without issues.  With the discontinuation of chlorthalidone on a daily basis she noted resolution of xerostomia.  Her weight was up approximately 4 pounds since her prior visit.  She was  started back on chlorthalidone 12.5 mg every other day.  She comes in doing very well from a cardiac perspective.  Since she was last seen, she feels like she is back to her prior baseline and feels more like herself.  Home BPs have been in the 130s over 60s to 140s over 80s.  She is pleased with her current blood pressure readings.  She has not needed any as needed diltiazem.  She has had 1 episode of possible palpitations that improved with rest.  She is tolerating chlorthalidone on an every other day basis.  Her weight is down 3 pounds today when compared to her last visit.  No dizziness, presyncope, syncope.  No chest pain or dyspnea.  She is very pleased with her improvement and feels like she is now back to baseline.     Labs independently reviewed: 06/2021 - potassium 4.2, BUN 17, serum creatinine 0.83, TSH normal, Hgb 13.5, PLT 261, magnesium 2.2, A1c 5.5 07/2020 - albumin 4.3, AST/ALT normal 01/2016 - TC 156, TG 86, HDL 45, LDL 94    Past Medical History:  Diagnosis Date   Allergy    Asthma    wheezing   Blurred vision    Candidiasis of mouth    Depression    hx of   GERD (gastroesophageal reflux disease)    Herniated disc, cervical    History of carpal tunnel release    Hyperglycemia    Metabolic syndrome    Obesity    Osteoarthritis    Ovarian deficiency    Vitamin D deficiency     Past Surgical History:  Procedure Laterality Date   ABDOMINAL HYSTERECTOMY  1991   BILATERAL SALPINGOOPHORECTOMY     CARPAL TUNNEL RELEASE Bilateral [id number]   [facility]   CATARACT EXTRACTION W/PHACO Left 09/23/2015   Procedure: CATARACT EXTRACTION PHACO AND INTRAOCULAR LENS PLACEMENT (IOC);  Surgeon: Lockie Mola, MD;  Location: [facility] ORS;  Service: Ophthalmology;  Laterality: Left;  [place] 00:57 AP% 12.2 6.98 CDE Fluid pack lot # [id number]   CATARACT EXTRACTION W/PHACO Right 10/17/2015   Procedure: CATARACT EXTRACTION PHACO AND INTRAOCULAR LENS PLACEMENT (IOC);  Surgeon: Lockie Mola,  MD;  Location: [facility] ORS;  Service: Ophthalmology;  Laterality: Right;  [place]  00:30.5 AP   00:47.2 CDE  6.05 casette lot #[id number]   DandC     DILATION AND CURETTAGE OF UTERUS  1990   HERNIA REPAIR  1992   ventral    Current Medications: Current Meds  Medication Sig   acetaminophen (TYLENOL) 500 MG tablet Take 1,000 mg by mouth 2 (two) times daily.   albuterol (PROAIR HFA) 108 (90 Base) MCG/ACT inhaler INHALE TWO PUFFS EVERY 6 HOURS AS NEEDEDWHEEZING   baclofen (LIORESAL) 10 MG tablet Take 1 tablet (10 mg total) by mouth 3 (three) times daily as needed for muscle spasms.   chlorthalidone (HYGROTON) 25 MG tablet Take 0.5 tablets (12.5 mg total) by mouth every other day.   Cholecalciferol (VITAMIN D) 2000 UNITS tablet Take 1 tablet by mouth daily. Bedtime   diltiazem (CARDIZEM) 30 MG tablet Take 1 tablet (30  mg total) by mouth 2 (two) times daily as needed (As needed for fast heart rates or palpitations).   esomeprazole (NEXIUM) 40 MG capsule Take 1 capsule (40 mg total) by mouth daily.   famotidine (PEPCID) 40 MG tablet Take 1 tablet (40 mg total) by mouth at bedtime. (Patient taking differently: Take 40 mg by mouth as needed.)   FLUoxetine (PROZAC) 20 MG tablet Take 1 tablet (20 mg total) by mouth daily.   Fluticasone Furoate (ARNUITY ELLIPTA) 100 MCG/ACT AEPB Inhale 1 puff into the lungs daily.   losartan (COZAAR) 25 MG tablet Take 1 tablet (25 mg total) by mouth daily.   montelukast (SINGULAIR) 10 MG tablet Take 1 tablet (10 mg total) by mouth at bedtime. (Patient taking differently: Take 10 mg by mouth as needed.)   Multiple Vitamins-Minerals (ICAPS AREDS 2) CAPS Take 2 capsules by mouth daily.    Probiotic Product (UP4 PROBIOTICS WOMENS PO) Take by mouth daily.   sucralfate (CARAFATE) 1 g tablet TAKE 1 TABLET (1 G TOTAL) BY MOUTH 4 (FOUR) TIMES DAILY - WITH MEALS AND AT BEDTIME. WITH MEALS (Patient taking differently: Take 1 g by mouth as needed. With meals)   vitamin B-12  (CYANOCOBALAMIN) 1000 MCG tablet Take 1,000 mcg by mouth daily. Bedtime    Allergies:   Breo ellipta [fluticasone furoate-vilanterol], Duloxetine, Morphine, Penicillins, and Sulfa antibiotics   Social History   Socioeconomic History   Marital status: Married    Spouse name: Maisie Fus   Number of children: 4   Years of education: Not on file   Highest education level: Associate degree: occupational, Scientist, product/process development, or vocational program  Occupational History   Occupation: Retired  Tobacco Use   Smoking status: Never   Smokeless tobacco: Never   Tobacco comments:    smoking cessation materials not required  Vaping Use   Vaping Use: Never used  Substance and Sexual Activity   Alcohol use: No    Alcohol/week: 0.0 standard drinks   Drug use: No   Sexual activity: Yes    Partners: Male  Other Topics Concern   Not on file  Social History Narrative   Not on file   Social Determinants of Health   Financial Resource Strain: Not on file  Food Insecurity: Not on file  Transportation Needs: Not on file  Physical Activity: Not on file  Stress: Not on file  Social Connections: Not on file     Family History:  The patient's family history includes CVA in her father; Cancer in her brother and father; Depression in her father; Healthy in her sister; Hypertension in her mother.  ROS:   Full 12 point review of systems is negative unless noted in the HPI.   EKGs/Labs/Other Studies Reviewed:    Studies reviewed were summarized above. The additional studies were reviewed today:  Zio patch 03/2021: The patient was monitored for 14 days. The predominant rhythm was sinus with an average rate of 55 bpm (range 43-96 bpm and sinus). There were occasional PACs (3.9% burden) and rare PVCs. 4 atrial runs lasting up to 7 beats with a maximum rate of 148 bpm were observed. No sustained arrhythmia or prolonged pause was seen. Patient triggered events correspond to sinus rhythm, PACs, and PVCs.    Predominantly sinus rhythm with occasional PACs and rare PVCs.  A few brief episodes of PSVT also noted.   EKG:  EKG is ordered today.  The EKG ordered today demonstrates NSR with sinus arrhythmia, 64 bpm, baseline artifact, no acute  ST-T changes  Recent Labs: 07/02/2021: BUN 17; Creatinine, Ser 0.83; Hemoglobin 13.5; Magnesium 2.2; Platelets 261; Potassium 4.2; Sodium 134; TSH 1.600  Recent Lipid Panel    Component Value Date/Time   CHOL 156 01/24/2016 0920   TRIG 86 01/24/2016 0920   HDL 45 01/24/2016 0920   CHOLHDL 3.5 01/24/2016 0920   LDLCALC 94 01/24/2016 0920    PHYSICAL EXAM:    VS:  BP 140/80 (BP Location: Left Arm, Patient Position: Sitting, Cuff Size: Large)   Pulse 64   Ht  (1.626 m)   Wt 263 lb 4 oz (119.4 kg)   SpO2 98%   BMI 45.19 kg/m   BMI: Body mass index is 45.19 kg/m.  Physical Exam Vitals reviewed.  Constitutional:      Appearance: She is well-developed.  HENT:     Head: Normocephalic and atraumatic.  Eyes:     General:        Right eye: No discharge.        Left eye: No discharge.  Neck:     Vascular: No JVD.  Cardiovascular:     Rate and Rhythm: Normal rate and regular rhythm.     Pulses:          Posterior tibial pulses are 2+ on the right side and 2+ on the left side.     Heart sounds: Normal heart sounds, S1 normal and S2 normal. Heart sounds not distant. No midsystolic click and no opening snap. No murmur heard.   No friction rub.  Pulmonary:     Effort: Pulmonary effort is normal. No respiratory distress.     Breath sounds: Normal breath sounds. No decreased breath sounds, wheezing or rales.  Chest:     Chest wall: No tenderness.  Abdominal:     General: There is no distension.     Palpations: Abdomen is soft.     Tenderness: There is no abdominal tenderness.  Musculoskeletal:     Cervical back: Normal range of motion.     Right lower leg: No edema.     Left lower leg: No edema.  Skin:    General: Skin is warm and dry.      Nails: There is no clubbing.  Neurological:     Mental Status: She is alert and oriented to person, place, and time.  Psychiatric:        Speech: Speech normal.        Behavior: Behavior normal.        Thought Content: Thought content normal.        Judgment: Judgment normal.    Wt Readings from Last 3 Encounters:  09/08/21 263 lb 4 oz (119.4 kg)  08/08/21 263 lb (119.3 kg)  07/31/21 266 lb (120.7 kg)     ASSESSMENT & PLAN:   Atrial tachycardia/PACs: Quiescent.  She has not needed any as needed short acting diltiazem.  Not currently on standing beta-blocker or not on dihydropyridine calcium channel blocker secondary to bradycardia, fatigue, and night terrors respectively.  Minimization of caffeine intake is encouraged.  Continue to monitor.  HTN: Blood pressure is reasonably controlled with readings in the 130s at home.  Continue current medical therapy including losartan and chlorthalidone.  Recent labs stable as outlined above.  Low-sodium diet recommended.  Fatigue: Resolved following discontinuation of beta-blocker.  Morbid obesity: Weight loss is encouraged through healthy diet and regular exercise.  Disposition: F/u with Dr. Okey Dupre or an APP in 6 months, sooner if needed.  Medication Adjustments/Labs and Tests Ordered: Current medicines are reviewed at length with the patient today.  Concerns regarding medicines are outlined above. Medication changes, Labs and Tests ordered today are summarized above and listed in the Patient Instructions accessible in Encounters.   Signed, Eula Listen, PA-C 09/08/2021 11:59 AM     [facility] -  [address] [place], [place] [address] [phone]

## 2021-09-08 ENCOUNTER — Encounter: Payer: Self-pay | Admitting: Physician Assistant

## 2021-09-08 ENCOUNTER — Other Ambulatory Visit: Payer: Self-pay

## 2021-09-08 ENCOUNTER — Ambulatory Visit: Payer: Medicare PPO | Admitting: Physician Assistant

## 2021-09-08 VITALS — BP 140/80 | HR 64 | Ht 64.0 in | Wt 263.2 lb

## 2021-09-08 DIAGNOSIS — R002 Palpitations: Secondary | ICD-10-CM | POA: Diagnosis not present

## 2021-09-08 DIAGNOSIS — I10 Essential (primary) hypertension: Secondary | ICD-10-CM | POA: Diagnosis not present

## 2021-09-08 DIAGNOSIS — R5383 Other fatigue: Secondary | ICD-10-CM | POA: Diagnosis not present

## 2021-09-08 DIAGNOSIS — I491 Atrial premature depolarization: Secondary | ICD-10-CM | POA: Diagnosis not present

## 2021-09-08 DIAGNOSIS — I471 Supraventricular tachycardia: Secondary | ICD-10-CM

## 2021-09-08 NOTE — Patient Instructions (Signed)
Medication Instructions:  No changes at this time.  *If you need a refill on your cardiac medications before your next appointment, please call your pharmacy*   Lab Work: None  If you have labs (blood work) drawn today and your tests are completely normal, you will receive your results only by: MyChart Message (if you have MyChart) OR A paper copy in the mail If you have any lab test that is abnormal or we need to change your treatment, we will call you to review the results.   Testing/Procedures: None   Follow-Up: At [facility], you and your health needs are our priority.  As part of our continuing mission to provide you with exceptional heart care, we have created designated Provider Care Teams.  These Care Teams include your primary Cardiologist (physician) and Advanced Practice Providers (APPs -  Physician Assistants and Nurse Practitioners) who all work together to provide you with the care you need, when you need it.   Your next appointment:   6 month(s)  The format for your next appointment:   In Person  Provider:   You may see Yvonne Kendall, MD or one of the following Advanced Practice Providers on your designated Care Team:   Nicolasa Ducking, NP Eula Listen, PA-C Marisue Ivan, PA-C Cadence Blackwood, [place]

## 2021-10-28 ENCOUNTER — Other Ambulatory Visit: Payer: Self-pay | Admitting: Family Medicine

## 2021-10-28 ENCOUNTER — Encounter: Payer: Self-pay | Admitting: Family Medicine

## 2021-10-28 ENCOUNTER — Other Ambulatory Visit: Payer: Self-pay

## 2021-10-28 DIAGNOSIS — J4540 Moderate persistent asthma, uncomplicated: Secondary | ICD-10-CM

## 2021-10-28 MED ORDER — ALBUTEROL SULFATE HFA 108 (90 BASE) MCG/ACT IN AERS: INHALATION_SPRAY | RESPIRATORY_TRACT | 1 refills | Status: AC

## 2021-10-31 DIAGNOSIS — H524 Presbyopia: Secondary | ICD-10-CM | POA: Diagnosis not present

## 2021-10-31 DIAGNOSIS — H353132 Nonexudative age-related macular degeneration, bilateral, intermediate dry stage: Secondary | ICD-10-CM | POA: Diagnosis not present

## 2021-12-22 ENCOUNTER — Other Ambulatory Visit: Payer: Self-pay

## 2021-12-22 ENCOUNTER — Ambulatory Visit: Payer: Self-pay | Admitting: *Deleted

## 2021-12-22 ENCOUNTER — Encounter: Payer: Self-pay | Admitting: Internal Medicine

## 2021-12-22 ENCOUNTER — Ambulatory Visit: Payer: Medicare PPO | Admitting: Internal Medicine

## 2021-12-22 VITALS — BP 132/84 | HR 75 | Resp 16 | Ht 64.0 in | Wt 263.0 lb

## 2021-12-22 DIAGNOSIS — M545 Low back pain, unspecified: Secondary | ICD-10-CM

## 2021-12-22 DIAGNOSIS — S39012S Strain of muscle, fascia and tendon of lower back, sequela: Secondary | ICD-10-CM

## 2021-12-22 MED ORDER — METHYLPREDNISOLONE 4 MG PO TBPK: ORAL_TABLET | ORAL | 0 refills | Status: DC

## 2021-12-22 NOTE — Patient Instructions (Addendum)
It was great seeing you today!  Plan discussed at today's visit: -Medrol dose pack sent to pharamacy -Can use moist heat, gentle stretching, anti-inflammatories gels like Voltaren and the muscle relaxer as needed -Let me know if you want to do physical therapy.   Follow up in: already scheduled in March  Take care and let us know if you have any questions or concerns prior to your next visit.  Dr. Caralee Ates  Low Back Sprain or Strain Rehab Ask your health care provider which exercises are safe for you. Do exercises exactly as told by your health care provider and adjust them as directed. It is normal to feel mild stretching, pulling, tightness, or discomfort as you do these exercises. Stop right away if you feel sudden pain or your pain gets worse. Do not begin these exercises until told by your health care provider. Stretching and range-of-motion exercises These exercises warm up your muscles and joints and improve the movement and flexibility of your back. These exercises also help to relieve pain, numbness, and tingling. Lumbar rotation  Lie on your back on a firm bed or the floor with your knees bent. Straighten your arms out to your sides so each arm forms a 90-degree angle (right angle) with a side of your body. Slowly move (rotate) both of your knees to one side of your body until you feel a stretch in your lower back (lumbar). Try not to let your shoulders lift off the floor. Hold this position for __________ seconds. Tense your abdominal muscles and slowly move your knees back to the starting position. Repeat this exercise on the other side of your body. Repeat __________ times. Complete this exercise __________ times a day. Single knee to chest  Lie on your back on a firm bed or the floor with both legs straight. Bend one of your knees. Use your hands to move your knee up toward your chest until you feel a gentle stretch in your lower back and buttock. Hold your leg in this  position by holding on to the front of your knee. Keep your other leg as straight as possible. Hold this position for __________ seconds. Slowly return to the starting position. Repeat with your other leg. Repeat __________ times. Complete this exercise __________ times a day. Prone extension on elbows  Lie on your abdomen on a firm bed or the floor (prone position). Prop yourself up on your elbows. Use your arms to help lift your chest up until you feel a gentle stretch in your abdomen and your lower back. This will place some of your body weight on your elbows. If this is uncomfortable, try stacking pillows under your chest. Your hips should stay down, against the surface that you are lying on. Keep your hip and back muscles relaxed. Hold this position for __________ seconds. Slowly relax your upper body and return to the starting position. Repeat __________ times. Complete this exercise __________ times a day. Strengthening exercises These exercises build strength and endurance in your back. Endurance is the ability to use your muscles for a long time, even after they get tired. Pelvic tilt This exercise strengthens the muscles that lie deep in the abdomen. Lie on your back on a firm bed or the floor with your legs extended. Bend your knees so they are pointing toward the ceiling and your feet are flat on the floor. Tighten your lower abdominal muscles to press your lower back against the floor. This motion will tilt your pelvis so  your tailbone points up toward the ceiling instead of pointing to your feet or the floor. To help with this exercise, you may place a small towel under your lower back and try to push your back into the towel. Hold this position for __________ seconds. Let your muscles relax completely before you repeat this exercise. Repeat __________ times. Complete this exercise __________ times a day. Alternating arm and leg raises  Get on your hands and knees on a firm  surface. If you are on a hard floor, you may want to use padding, such as an exercise mat, to cushion your knees. Line up your arms and legs. Your hands should be directly below your shoulders, and your knees should be directly below your hips. Lift your left leg behind you. At the same time, raise your right arm and straighten it in front of you. Do not lift your leg higher than your hip. Do not lift your arm higher than your shoulder. Keep your abdominal and back muscles tight. Keep your hips facing the ground. Do not arch your back. Keep your balance carefully, and do not hold your breath. Hold this position for __________ seconds. Slowly return to the starting position. Repeat with your right leg and your left arm. Repeat __________ times. Complete this exercise __________ times a day. Abdominal set with straight leg raise  Lie on your back on a firm bed or the floor. Bend one of your knees and keep your other leg straight. Tense your abdominal muscles and lift your straight leg up, 4-6 inches (10-15 cm) off the ground. Keep your abdominal muscles tight and hold this position for __________ seconds. Do not hold your breath. Do not arch your back. Keep it flat against the ground. Keep your abdominal muscles tense as you slowly lower your leg back to the starting position. Repeat with your other leg. Repeat __________ times. Complete this exercise __________ times a day. Single leg lower with bent knees Lie on your back on a firm bed or the floor. Tense your abdominal muscles and lift your feet off the floor, one foot at a time, so your knees and hips are bent in 90-degree angles (right angles). Your knees should be over your hips and your lower legs should be parallel to the floor. Keeping your abdominal muscles tense and your knee bent, slowly lower one of your legs so your toe touches the ground. Lift your leg back up to return to the starting position. Do not hold your breath. Do  not let your back arch. Keep your back flat against the ground. Repeat with your other leg. Repeat __________ times. Complete this exercise __________ times a day. Posture and body mechanics Good posture and healthy body mechanics can help to relieve stress in your body's tissues and joints. Body mechanics refers to the movements and positions of your body while you do your daily activities. Posture is part of body mechanics. Good posture means: Your spine is in its natural S-curve position (neutral). Your shoulders are pulled back slightly. Your head is not tipped forward (neutral). Follow these guidelines to improve your posture and body mechanics in your everyday activities. Standing  When standing, keep your spine neutral and your feet about hip-width apart. Keep a slight bend in your knees. Your ears, shoulders, and hips should line up. When you do a task in which you stand in one place for a long time, place one foot up on a stable object that is 2-4 inches (5-10  cm) high, such as a footstool. This helps keep your spine neutral. Sitting  When sitting, keep your spine neutral and keep your feet flat on the floor. Use a footrest, if necessary, and keep your thighs parallel to the floor. Avoid rounding your shoulders, and avoid tilting your head forward. When working at a desk or a computer, keep your desk at a height where your hands are slightly lower than your elbows. Slide your chair under your desk so you are close enough to maintain good posture. When working at a computer, place your monitor at a height where you are looking straight ahead and you do not have to tilt your head forward or downward to look at the screen. Resting When lying down and resting, avoid positions that are most painful for you. If you have pain with activities such as sitting, bending, stooping, or squatting, lie in a position in which your body does not bend very much. For example, avoid curling up on your side  with your arms and knees near your chest (fetal position). If you have pain with activities such as standing for a long time or reaching with your arms, lie with your spine in a neutral position and bend your knees slightly. Try the following positions: Lying on your side with a pillow between your knees. Lying on your back with a pillow under your knees. Lifting  When lifting objects, keep your feet at least shoulder-width apart and tighten your abdominal muscles. Bend your knees and hips and keep your spine neutral. It is important to lift using the strength of your legs, not your back. Do not lock your knees straight out. Always ask for help to lift heavy or awkward objects. This information is not intended to replace advice given to you by your health care provider. Make sure you discuss any questions you have with your health care provider. Document Revised: 01/20/2021 Document [employer]

## 2021-12-22 NOTE — Progress Notes (Signed)
Acute Office Visit  Subjective:    Patient ID: Christie Hanson, female    DOB: [birth year], 76 y.o.   MRN: [id number]  Chief Complaint  Patient presents with   Back Pain    Worse over last month    HPI Patient is in today for back pain. Has chronic lumbar pain but fell flat on her bottom in July in her kitchen after a mechanical fall, now having worsening pain last month.Pain is located in the lumbar spine at the area of L4-L5 bilaterally but worse on the left.  Getting better over time, but has not gone away. Using heating pad and taking Tylenol. Working on core as well. Took Baclofen when it first happened in July but not since.  BACK PAIN Duration: 1 month  Mechanism of injury: unknown Location: bilateral and low back with numbness in left side down left leg  Onset: sudden Quality: dull and aching, sharp on the left  Frequency: constant Radiation: L leg below the knee Aggravating factors: lifting, movement, and bending Alleviating factors: heat and APAP Status: better Treatments attempted: rest, heat, and APAP  Relief with NSAIDs?: No NSAIDs Taken, history of gastric ulcer  Nighttime pain:  no Paresthesias / decreased sensation:  no Bowel / bladder incontinence:  no Fevers:  no Dysuria / urinary frequency:  no   Past Medical History:  Diagnosis Date   Allergy    Asthma    wheezing   Blurred vision    Candidiasis of mouth    Depression    hx of   GERD (gastroesophageal reflux disease)    Herniated disc, cervical    History of carpal tunnel release    Hyperglycemia    Metabolic syndrome    Obesity    Osteoarthritis    Ovarian deficiency    Vitamin D deficiency     Past Surgical History:  Procedure Laterality Date   ABDOMINAL HYSTERECTOMY  1991   BILATERAL SALPINGOOPHORECTOMY     CARPAL TUNNEL RELEASE Bilateral [id number]   [facility]   CATARACT EXTRACTION W/PHACO Left 09/23/2015   Procedure: CATARACT EXTRACTION PHACO AND INTRAOCULAR LENS PLACEMENT (IOC);   Surgeon: Leandrew Koyanagi, MD;  Location: [facility];  Service: Ophthalmology;  Laterality: Left;  [place] 00:57 AP% 12.2 6.98 CDE Fluid pack lot # [id number]   CATARACT EXTRACTION W/PHACO Right 10/17/2015   Procedure: CATARACT EXTRACTION PHACO AND INTRAOCULAR LENS PLACEMENT (IOC);  Surgeon: Leandrew Koyanagi, MD;  Location: [facility];  Service: Ophthalmology;  Laterality: Right;  [place]  00:30.5 AP   00:47.2 CDE  6.05 casette lot #[id number]   DandC     DILATION AND CURETTAGE OF UTERUS  1990   HERNIA REPAIR  1992   ventral    Family History  Problem Relation Age of Onset   Hypertension Mother    Cancer Father        prostate   CVA Father    Depression Father    Healthy Sister    Cancer Brother        prostate    Social History   Socioeconomic History   Marital status: Married    Spouse name: Marcello Moores   Number of children: 4   Years of education: Not on file   Highest education level: Associate degree: occupational, Hotel manager, or vocational program  Occupational History   Occupation: Retired  Tobacco Use   Smoking status: Never   Smokeless tobacco: Never   Tobacco comments:    smoking cessation materials not required  Vaping Use  Vaping Use: Never used  Substance and Sexual Activity   Alcohol use: No    Alcohol/week: 0.0 standard drinks   Drug use: No   Sexual activity: Yes    Partners: Male  Other Topics Concern   Not on file  Social History Narrative   Not on file   Social Determinants of Health   Financial Resource Strain: Not on file  Food Insecurity: Not on file  Transportation Needs: Not on file  Physical Activity: Not on file  Stress: Not on file  Social Connections: Not on file  Intimate Partner Violence: Not on file    Outpatient Medications Prior to Visit  Medication Sig Dispense Refill   acetaminophen (TYLENOL) 500 MG tablet Take 1,000 mg by mouth 2 (two) times daily.     albuterol (PROAIR HFA) 108 (90 Base) MCG/ACT inhaler INHALE TWO PUFFS EVERY 6  HOURS AS NEEDEDWHEEZING 8.5 g 1   baclofen (LIORESAL) 10 MG tablet Take 1 tablet (10 mg total) by mouth 3 (three) times daily as needed for muscle spasms. 60 each 0   chlorthalidone (HYGROTON) 25 MG tablet Take 0.5 tablets (12.5 mg total) by mouth every other day. 7.5 tablet 3   Cholecalciferol (VITAMIN D) 2000 UNITS tablet Take 1 tablet by mouth daily. Bedtime     diltiazem (CARDIZEM) 30 MG tablet Take 1 tablet (30 mg total) by mouth 2 (two) times daily as needed (As needed for fast heart rates or palpitations). 90 tablet 3   esomeprazole (NEXIUM) 40 MG capsule Take 1 capsule (40 mg total) by mouth daily. 90 capsule 1   FLUoxetine (PROZAC) 20 MG tablet Take 1 tablet (20 mg total) by mouth daily. 90 tablet 1   Fluticasone Furoate (ARNUITY ELLIPTA) 100 MCG/ACT AEPB Inhale 1 puff into the lungs daily. 30 each 5   losartan (COZAAR) 25 MG tablet Take 1 tablet (25 mg total) by mouth daily. 90 tablet 3   montelukast (SINGULAIR) 10 MG tablet Take 1 tablet (10 mg total) by mouth at bedtime. (Patient taking differently: Take 10 mg by mouth as needed.) 90 tablet 1   Multiple Vitamins-Minerals (ICAPS AREDS 2) CAPS Take 2 capsules by mouth daily.      Probiotic Product (UP4 PROBIOTICS WOMENS PO) Take by mouth daily.     sucralfate (CARAFATE) 1 g tablet TAKE 1 TABLET (1 G TOTAL) BY MOUTH 4 (FOUR) TIMES DAILY - WITH MEALS AND AT BEDTIME. WITH MEALS (Patient taking differently: Take 1 g by mouth as needed. With meals) 120 tablet 5   vitamin B-12 (CYANOCOBALAMIN) 1000 MCG tablet Take 1,000 mcg by mouth daily. Bedtime     No facility-administered medications prior to visit.    Allergies  Allergen Reactions   Breo Ellipta [Fluticasone Furoate-Vilanterol]     Palpitation    Duloxetine     Mental fogginess    Morphine    Penicillins    Sulfa Antibiotics     Review of Systems  Constitutional:  Negative for chills and fever.  Musculoskeletal:  Positive for back pain.  Skin: Negative.   Neurological:   Positive for numbness. Negative for weakness.      Objective:    Physical Exam Constitutional:      Appearance: Normal appearance.  HENT:     Head: Normocephalic and atraumatic.  Eyes:     Conjunctiva/sclera: Conjunctivae normal.  Cardiovascular:     Rate and Rhythm: Normal rate and regular rhythm.  Pulmonary:     Effort: Pulmonary effort is normal.  Breath sounds: Normal breath sounds.  Musculoskeletal:        General: Tenderness present.     Right lower leg: No edema.     Left lower leg: No edema.     Comments: At the level of L4-5 bilaterally, worse on left  Skin:    General: Skin is warm and dry.  Neurological:     General: No focal deficit present.     Mental Status: She is alert. Mental status is at baseline.  Psychiatric:        Mood and Affect: Mood normal.        Behavior: Behavior normal.   Back Exam:    Inspection:  Normal spinal curvature.  No deformity, ecchymosis, erythema, or lesions   Ecchymosis: no   Erythema:  no   Lesions: no    Palpation:     Midline spinal tenderness: no     Paralumbar tenderness: yes bilateral     Parathoracic tenderness: no        Buttocks tenderness: yesLeft     Range of Motion:      Flexion: Fingers to Knees     Extension:Normal     Lateral bending:Decreased    Rotation:Normal    BP 132/84    Pulse 75    Resp 16    Ht _0  (1.626 m)    Wt 263 lb (119.3 kg)    SpO2 97%    BMI 45.14 kg/m  Wt Readings from Last 3 Encounters:  09/08/21 263 lb 4 oz (119.4 kg)  08/08/21 263 lb (119.3 kg)  07/31/21 266 lb (120.7 kg)    Health Maintenance Due  Topic Date Due   MAMMOGRAM  02/02/2017   TETANUS/TDAP  11/16/2018   Fecal DNA (Cologuard)  04/20/2021   COVID-19 Vaccine (5 - Booster for Moderna series) 12/19/2021    There are no preventive care reminders to display for this patient.   Lab Results  Component Value Date   TSH 1.600 07/02/2021   Lab Results  Component Value Date   WBC 8.3 07/02/2021   HGB 13.5  07/02/2021   HCT 40.1 07/02/2021   MCV 93.3 07/02/2021   PLT 261 07/02/2021   Lab Results  Component Value Date   NA 134 (L) 07/02/2021   K 4.2 07/02/2021   CO2 32 07/02/2021   GLUCOSE 105 (H) 07/02/2021   BUN 17 07/02/2021   CREATININE 0.83 07/02/2021   BILITOT 0.5 08/07/2020   ALKPHOS 80 01/24/2016   AST 15 08/07/2020   ALT 9 08/07/2020   PROT 7.0 08/07/2020   ALBUMIN 4.1 01/24/2016   CALCIUM 8.8 (L) 07/02/2021   ANIONGAP 8 07/02/2021   EGFR 70 04/04/2021   Lab Results  Component Value Date   CHOL 156 01/24/2016   Lab Results  Component Value Date   HDL 45 01/24/2016   Lab Results  Component Value Date   LDLCALC 94 01/24/2016   Lab Results  Component Value Date   TRIG 86 01/24/2016   Lab Results  Component Value Date   CHOLHDL 3.5 01/24/2016   Lab Results  Component Value Date   HGBA1C 5.5 07/02/2021       Assessment & Plan:   1. Strain of lumbar region, sequela: Appears muscular in nature overlaying chronic osteoarthritis. She cannot take NSAIDs, will treat with Medrol dosepack, as well as gentle stretching, muscle relaxer as needed, moist heat and topical anti-inflammatories. Consider physical therapy if pain does not resolve.   - methylPREDNISolone (  MEDROL DOSEPAK) 4 MG TBPK tablet; Day 1: Take 8 mg (2 tablets) before breakfast, 4 mg (1 tablet) after lunch, 4 mg (1 tablet) after supper, and 8 mg (2 tablets) at bedtime. Day 2:Take 4 mg (1 tablet) before breakfast, 4 mg (1 tablet) after lunch, 4 mg (1 tablet) after supper, and 8 mg (2 tablets) at bedtime. Day 3: Take 4 mg (1 tablet) before breakfast, 4 mg (1 tablet) after lunch, 4 mg (1 tablet) after supper, and 4 mg (1 tablet) at bedtime. Day 4: Take 4 mg (1 tablet) before breakfast, 4 mg (1 tablet) after lunch, and 4 mg (1 tablet) at bedtime. Day 5: Take 4 mg (1 tablet) before breakfast and 4 mg (1 tablet) at bedtime. Day 6: Take 4 mg (1 tablet) before breakfast.  Dispense: 1 each; Refill: 0   Teodora Medici, DO

## 2021-12-22 NOTE — Telephone Encounter (Signed)
°  Chief Complaint: back pain Symptoms: shooting pain Frequency: off and on Pertinent Negatives: Patient denies bladder issues Disposition: [] ED /[] Urgent Care (no appt availability in office) / [x] Appointment(In office/virtual)/ []  [facility]/ [] Home Care/ [] Refused Recommended Disposition /[] [facility]/ []  Follow-up with PCP Additional Notes: able to get appt today.   Reason for Disposition  [1] MODERATE back pain (e.g., interferes with normal activities) AND [2] present > 3 days  Answer Assessment - Initial Assessment Questions 1. ONSET: "When did the pain begin?"      2 weeks 2. LOCATION: "Where does it hurt?" (upper, mid or lower back)     Left back runs down leg to foot 3. SEVERITY: "How bad is the pain?"  (e.g., Scale 1-10; mild, moderate, or severe)   - MILD (1-3): doesn't interfere with normal activities    - MODERATE (4-7): interferes with normal activities or awakens from sleep    - SEVERE (8-10): excruciating pain, unable to do any normal activities      7 4. PATTERN: "Is the pain constant?" (e.g., yes, no; constant, intermittent)      Off and on, worse in AM 5. RADIATION: "Does the pain shoot into your legs or elsewhere?"     Down leg to foot, foot numb at times 6. CAUSE:  "What do you think is causing the back pain?"      Unsure if this is related to July fall. Chronic back pain. 7. BACK OVERUSE:  "Any recent lifting of heavy objects, strenuous work or exercise?"     na 8. MEDICATIONS: "What have you taken so far for the pain?" (e.g., nothing, acetaminophen, NSAIDS)     Tylenol, does not take NSAIDs due to GI problems 9. NEUROLOGIC SYMPTOMS: "Do you have any weakness, numbness, or problems with bowel/bladder control?"     Leg does not get weak, no bladder issue.no 10. OTHER SYMPTOMS: "Do you have any other symptoms?" (e.g., fever, abdominal pain, burning with urination, blood in urine)       no 11. PREGNANCY: "Is there any chance you are  pregnant?" (e.g., yes, no; LMP)       no  Protocols used: Back Pain-A-AH

## 2022-01-16 ENCOUNTER — Encounter: Payer: Self-pay | Admitting: Family Medicine

## 2022-01-26 ENCOUNTER — Encounter: Payer: Self-pay | Admitting: Internal Medicine

## 2022-01-27 ENCOUNTER — Other Ambulatory Visit: Payer: Self-pay

## 2022-01-27 DIAGNOSIS — M545 Low back pain, unspecified: Secondary | ICD-10-CM

## 2022-01-29 DIAGNOSIS — Z1211 Encounter for screening for malignant neoplasm of colon: Secondary | ICD-10-CM | POA: Diagnosis not present

## 2022-02-02 DIAGNOSIS — R262 Difficulty in walking, not elsewhere classified: Secondary | ICD-10-CM | POA: Diagnosis not present

## 2022-02-02 DIAGNOSIS — M5459 Other low back pain: Secondary | ICD-10-CM | POA: Diagnosis not present

## 2022-02-02 DIAGNOSIS — M25552 Pain in left hip: Secondary | ICD-10-CM | POA: Diagnosis not present

## 2022-02-04 NOTE — Progress Notes (Signed)
Name: Christie Hanson   MRN: [id number]    DOB: 1946/02/17   Date:02/05/2022 ? ?     Progress Note ? ?Subjective ? ?Chief Complaint ? ?Follow Up ? ?HPI ? ?Metabolic Syndrome: last hgbA1C was 5.5%, she denies polyphagia, polydipsia or polyuria. Unchanged  ? ?Hypertension Benign: she could not tolerate Bystolic, she is now on Losartan and chlorthalidone half a pill every other day , on cardizem only prn for palpitation and not very often. Continue medication ? ?Zio patch 03/2021: ?The patient was monitored for 14 days. ?The predominant rhythm was sinus with an average rate of 55 bpm (range 43-96 bpm and sinus). ?There were occasional PACs (3.9% burden) and rare PVCs. 4 atrial runs lasting up to 7 beats with a maximum rate of 148 bpm were observed. ?No sustained arrhythmia or prolonged pause was seen. ?Patient triggered events correspond to sinus rhythm, PACs, and PVCs. ?  ?Predominantly sinus rhythm with occasional PACs and rare PVCs.  A few brief episodes of PSVT also noted. ?  ?Asthma Moderate:She was taking Breo daily but since seen by cardiologist for palpiation she stopped taking medication. She is no longer taking Singulair. She is now on Arnuity only and and symptoms are controlled, no wheezing, she has chronic SOB and likely multifactorial  ?  ?GERD: she has been doing well with Nexium daily and pepcid prn, lost some weight and she also avoids eating triggering food ?  ?Neuropathy: she states she has burning sensation on both feet for many years, she is on B12 supplementation, she states symptoms have been under control . Stable.  ?  ?Meralgia paresthetica: it was present for many years, right lateral thigh, not associated with weakness, she states pain is not as severe since she lost weight.  ?  ?Depression: she stopped taking all medication back in 2017.Took Prozac and Abilify for many years prior to that . She stopped because of side effects, like tremors Tried cymbalta but it made her feel "weird" and  stopped medication, it made her feel anxious, however decided to go back on Prozac 2022  she is is more pain and has been "grumpy". She states she had to stop sewing due to back pain.  ?  ?Obesity: she had  lost 10  lbs last year and now is down a few more pounds, she states since in pain not cooking or snacking as often since it is painful to move. ?  ?Vitamin D and Vitamin B12 deficiency: back to normal still on supplementation.  ?  ?OA: she  has been less active and pain on both knees, currently seeing Dr. Landry Mellow at [facility], she had hyaluronic acid injections and helped a little initially, but has been off NSAID's and has been taking Tylenol BID ?  ?Senile purpura: both arms, now with a small excoriation on right arm and we will send her to get Tdap at local pharmacy ? ?Chronic low back pain:  with a flare, she states tripped and fell at home in July 22  and fell again August 22, the pain was only on lower back and aching like, but around January she noticed increase in pain that was radiating down left lower leg, seen by Dr. Caralee Ates and was given prednisone taper and pain improved for 3 days but is back again, she had one session of PT and pain got worse, advised to continue PT and if no resolution or significant improvement to call back and we will order MRI lumbar pain. ? ?  Vertigo:she states symptoms usually when going to bed, getting out of bed or rolling in bed. She tries  Epley maneuver  at home, at times it causes nausea . It does not happen every night and wants to hold off on seeing ENT ? ?Patient Active Problem List  ? Diagnosis Date Noted  ? Essential hypertension 04/06/2021  ? Palpitations 04/06/2021  ? BMI 45.0-49.9, adult (HCC) 02/23/2020  ? Senile purpura (HCC) 03/14/2018  ? Age-related macular degeneration 03/14/2018  ? Morbid obesity (HCC) 03/14/2018  ? Peripheral polyneuropathy 03/02/2017  ? Vitamin B12 deficiency 07/29/2015  ? Carpal tunnel syndrome 07/27/2015  ? Osteoarthritis  07/27/2015  ? Depression, major, recurrent, mild (HCC) 07/27/2015  ? Gastro-esophageal reflux disease without esophagitis 07/27/2015  ? Displacement of cervical intervertebral disc without myelopathy 07/27/2015  ? Dysmetabolic syndrome 07/27/2015  ? Vitamin D deficiency 07/27/2015  ? Sprain of rotator cuff capsule 07/27/2015  ? Urgency of urination 07/27/2015  ? Allergic rhinitis 07/27/2015  ? Asthma, moderate persistent 07/27/2015  ? ? ?Past Surgical History:  ?Procedure Laterality Date  ? ABDOMINAL HYSTERECTOMY  1991  ? BILATERAL SALPINGOOPHORECTOMY    ? CARPAL TUNNEL RELEASE Bilateral [id number]  ? [facility]  ? CATARACT EXTRACTION W/PHACO Left 09/23/2015  ? Procedure: CATARACT EXTRACTION PHACO AND INTRAOCULAR LENS PLACEMENT (IOC);  Surgeon: Lockie Molahadwick Brasington, MD;  Location: [facility] OR[facility];  Service: Ophthalmology;  Laterality: Left;  [place] 00:57 ?AP% 12.2 ?6.98 CDE ?Fluid pack lot # [id number]  ? CATARACT EXTRACTION W/PHACO Right 10/17/2015  ? Procedure: CATARACT EXTRACTION PHACO AND INTRAOCULAR LENS PLACEMENT (IOC);  Surgeon: Lockie Molahadwick Brasington, MD;  Location: [facility] OR[facility];  Service: Ophthalmology;  Laterality: Right;  [place]  00:30.5 ?AP   00:47.2 ?CDE  6.05 ?casette lot #[id number]  ? DandC    ? DILATION AND CURETTAGE OF UTERUS  1990  ? HERNIA REPAIR  1992  ? ventral  ? ? ?Family History  ?Problem Relation Age of Onset  ? Hypertension Mother   ? Cancer Father   ?     prostate  ? CVA Father   ? Depression Father   ? Healthy Sister   ? Cancer Brother   ?     prostate  ? ? ?Social History  ? ?Tobacco Use  ? Smoking status: Never  ? Smokeless tobacco: Never  ? Tobacco comments:  ?  smoking cessation materials not required  ?Substance Use Topics  ? Alcohol use: No  ?  Alcohol/week: 0.0 standard drinks  ? ? ? ?Current Outpatient Medications:  ?  acetaminophen (TYLENOL) 500 MG tablet, Take 1,000 mg by mouth 2 (two) times daily., Disp: , Rfl:  ?  albuterol (PROAIR HFA) 108 (90 Base) MCG/ACT inhaler, INHALE TWO PUFFS EVERY 6 HOURS AS  NEEDEDWHEEZING, Disp: 8.5 g, Rfl: 1 ?  Cholecalciferol (VITAMIN D) 2000 UNITS tablet, Take 1 tablet by mouth daily. Bedtime, Disp: , Rfl:  ?  esomeprazole (NEXIUM) 40 MG capsule, Take 1 capsule (40 mg total) by mouth daily., Disp: 90 capsule, Rfl: 1 ?  FLUoxetine (PROZAC) 20 MG tablet, Take 1 tablet (20 mg total) by mouth daily., Disp: 90 tablet, Rfl: 1 ?  Fluticasone Furoate (ARNUITY ELLIPTA) 100 MCG/ACT AEPB, Inhale 1 puff into the lungs daily., Disp: 30 each, Rfl: 5 ?  Multiple Vitamins-Minerals (ICAPS AREDS 2) CAPS, Take 2 capsules by mouth daily. , Disp: , Rfl:  ?  Probiotic Product (UP4 PROBIOTICS WOMENS PO), Take by mouth daily., Disp: , Rfl:  ?  vitamin B-12 (CYANOCOBALAMIN) 1000 MCG tablet, Take 1,000  mcg by mouth daily. Bedtime, Disp: , Rfl:  ?  baclofen (LIORESAL) 10 MG tablet, Take 1 tablet (10 mg total) by mouth 3 (three) times daily as needed for muscle spasms. (Patient not taking: Reported on 02/05/2022), Disp: 60 each, Rfl: 0 ?  chlorthalidone (HYGROTON) 25 MG tablet, Take 0.5 tablets (12.5 mg total) by mouth every other day., Disp: 7.5 tablet, Rfl: 3 ?  losartan (COZAAR) 25 MG tablet, Take 1 tablet (25 mg total) by mouth daily., Disp: 90 tablet, Rfl: 3 ? ?Allergies  ?Allergen Reactions  ? Breo Ellipta [Fluticasone Furoate-Vilanterol]   ?  Palpitation   ? Duloxetine   ?  Mental fogginess   ? Morphine   ? Penicillins   ? Sulfa Antibiotics   ? ? ?I personally reviewed active problem list, medication list, allergies, family history, social history, health maintenance with the patient/caregiver today. ? ? ?ROS ? ?Constitutional: Negative for fever or significant  weight change.  ?Respiratory: Negative for cough , positive for shortness of breath.   ?Cardiovascular: Negative for chest pain or palpitations.  ?Gastrointestinal: Negative for abdominal pain, no bowel changes.  ?Musculoskeletal: positive for gait problem and  joint swelling.  ?Skin: Negative for rash.  ?Neurological: positive  for dizziness but  no  headache.  ?No other specific complaints in a complete review of systems (except as listed in HPI above).  ? ?Objective ? ?Vitals:  ? 02/05/22 0943  ?BP: 130/82  ?Pulse: 84  ?Resp: 16  ?SpO2: 98%  ?Weight: 261 lb (118

## 2022-02-05 ENCOUNTER — Ambulatory Visit (INDEPENDENT_AMBULATORY_CARE_PROVIDER_SITE_OTHER): Payer: Medicare PPO | Admitting: Family Medicine

## 2022-02-05 ENCOUNTER — Encounter: Payer: Self-pay | Admitting: Family Medicine

## 2022-02-05 VITALS — BP 130/82 | HR 84 | Resp 16 | Ht 64.0 in | Wt 261.0 lb

## 2022-02-05 DIAGNOSIS — E538 Deficiency of other specified B group vitamins: Secondary | ICD-10-CM

## 2022-02-05 DIAGNOSIS — D692 Other nonthrombocytopenic purpura: Secondary | ICD-10-CM

## 2022-02-05 DIAGNOSIS — M5442 Lumbago with sciatica, left side: Secondary | ICD-10-CM

## 2022-02-05 DIAGNOSIS — F330 Major depressive disorder, recurrent, mild: Secondary | ICD-10-CM | POA: Diagnosis not present

## 2022-02-05 DIAGNOSIS — K219 Gastro-esophageal reflux disease without esophagitis: Secondary | ICD-10-CM | POA: Diagnosis not present

## 2022-02-05 DIAGNOSIS — G629 Polyneuropathy, unspecified: Secondary | ICD-10-CM | POA: Diagnosis not present

## 2022-02-05 DIAGNOSIS — Z1231 Encounter for screening mammogram for malignant neoplasm of breast: Secondary | ICD-10-CM

## 2022-02-05 DIAGNOSIS — Z23 Encounter for immunization: Secondary | ICD-10-CM

## 2022-02-05 DIAGNOSIS — S40811A Abrasion of right upper arm, initial encounter: Secondary | ICD-10-CM | POA: Diagnosis not present

## 2022-02-05 DIAGNOSIS — M159 Polyosteoarthritis, unspecified: Secondary | ICD-10-CM

## 2022-02-05 DIAGNOSIS — J4540 Moderate persistent asthma, uncomplicated: Secondary | ICD-10-CM

## 2022-02-05 DIAGNOSIS — Z1211 Encounter for screening for malignant neoplasm of colon: Secondary | ICD-10-CM

## 2022-02-05 DIAGNOSIS — I491 Atrial premature depolarization: Secondary | ICD-10-CM

## 2022-02-05 DIAGNOSIS — E559 Vitamin D deficiency, unspecified: Secondary | ICD-10-CM

## 2022-02-05 DIAGNOSIS — G8929 Other chronic pain: Secondary | ICD-10-CM

## 2022-02-05 LAB — COLOGUARD: COLOGUARD: NEGATIVE

## 2022-02-05 MED ORDER — FLUOXETINE HCL 20 MG PO TABS: 20.0000 mg | ORAL_TABLET | Freq: Every day | ORAL | 1 refills | Status: DC

## 2022-02-05 MED ORDER — ESOMEPRAZOLE MAGNESIUM 40 MG PO CPDR: 40.0000 mg | DELAYED_RELEASE_CAPSULE | Freq: Every day | ORAL | 1 refills | Status: DC

## 2022-02-05 MED ORDER — TETANUS-DIPHTH-ACELL PERTUSSIS 5-2-15.5 LF-MCG/0.5 IM SUSP: 0.5000 mL | Freq: Once | INTRAMUSCULAR | 0 refills | Status: AC

## 2022-02-05 MED ORDER — ARNUITY ELLIPTA 100 MCG/ACT IN AEPB: 1.0000 | INHALATION_SPRAY | Freq: Every day | RESPIRATORY_TRACT | 5 refills | Status: DC

## 2022-02-06 ENCOUNTER — Ambulatory Visit: Payer: Medicare PPO | Admitting: Family Medicine

## 2022-02-06 DIAGNOSIS — M25552 Pain in left hip: Secondary | ICD-10-CM | POA: Diagnosis not present

## 2022-02-06 DIAGNOSIS — R262 Difficulty in walking, not elsewhere classified: Secondary | ICD-10-CM | POA: Diagnosis not present

## 2022-02-06 DIAGNOSIS — M5459 Other low back pain: Secondary | ICD-10-CM | POA: Diagnosis not present

## 2022-02-09 DIAGNOSIS — M25552 Pain in left hip: Secondary | ICD-10-CM | POA: Diagnosis not present

## 2022-02-09 DIAGNOSIS — M5459 Other low back pain: Secondary | ICD-10-CM | POA: Diagnosis not present

## 2022-02-09 DIAGNOSIS — R262 Difficulty in walking, not elsewhere classified: Secondary | ICD-10-CM | POA: Diagnosis not present

## 2022-02-10 DIAGNOSIS — M5459 Other low back pain: Secondary | ICD-10-CM | POA: Diagnosis not present

## 2022-02-10 DIAGNOSIS — M25552 Pain in left hip: Secondary | ICD-10-CM | POA: Diagnosis not present

## 2022-02-10 DIAGNOSIS — R262 Difficulty in walking, not elsewhere classified: Secondary | ICD-10-CM | POA: Diagnosis not present

## 2022-02-12 ENCOUNTER — Encounter: Payer: Self-pay | Admitting: Family Medicine

## 2022-02-13 ENCOUNTER — Other Ambulatory Visit: Payer: Self-pay | Admitting: Family Medicine

## 2022-02-13 DIAGNOSIS — G8929 Other chronic pain: Secondary | ICD-10-CM

## 2022-02-16 DIAGNOSIS — M5459 Other low back pain: Secondary | ICD-10-CM | POA: Diagnosis not present

## 2022-02-16 DIAGNOSIS — M25552 Pain in left hip: Secondary | ICD-10-CM | POA: Diagnosis not present

## 2022-02-16 DIAGNOSIS — R262 Difficulty in walking, not elsewhere classified: Secondary | ICD-10-CM | POA: Diagnosis not present

## 2022-02-18 DIAGNOSIS — R262 Difficulty in walking, not elsewhere classified: Secondary | ICD-10-CM | POA: Diagnosis not present

## 2022-02-18 DIAGNOSIS — M25552 Pain in left hip: Secondary | ICD-10-CM | POA: Diagnosis not present

## 2022-02-18 DIAGNOSIS — M5459 Other low back pain: Secondary | ICD-10-CM | POA: Diagnosis not present

## 2022-02-23 DIAGNOSIS — M25552 Pain in left hip: Secondary | ICD-10-CM | POA: Diagnosis not present

## 2022-02-23 DIAGNOSIS — R262 Difficulty in walking, not elsewhere classified: Secondary | ICD-10-CM | POA: Diagnosis not present

## 2022-02-23 DIAGNOSIS — M5459 Other low back pain: Secondary | ICD-10-CM | POA: Diagnosis not present

## 2022-02-24 ENCOUNTER — Ambulatory Visit
Admission: RE | Admit: 2022-02-24 | Discharge: 2022-02-24 | Disposition: A | Discharge status: Home / Self Care | Payer: Medicare PPO | Source: Ambulatory Visit | Attending: Family Medicine | Admitting: Family Medicine

## 2022-02-24 DIAGNOSIS — G8929 Other chronic pain: Secondary | ICD-10-CM | POA: Diagnosis not present

## 2022-02-24 DIAGNOSIS — M5116 Intervertebral disc disorders with radiculopathy, lumbar region: Secondary | ICD-10-CM | POA: Diagnosis not present

## 2022-02-24 DIAGNOSIS — M5442 Lumbago with sciatica, left side: Secondary | ICD-10-CM | POA: Insufficient documentation

## 2022-02-24 DIAGNOSIS — M4316 Spondylolisthesis, lumbar region: Secondary | ICD-10-CM | POA: Diagnosis not present

## 2022-02-24 DIAGNOSIS — M48061 Spinal stenosis, lumbar region without neurogenic claudication: Secondary | ICD-10-CM | POA: Diagnosis not present

## 2022-02-25 ENCOUNTER — Other Ambulatory Visit: Payer: Self-pay | Admitting: Family Medicine

## 2022-02-25 DIAGNOSIS — M4807 Spinal stenosis, lumbosacral region: Secondary | ICD-10-CM

## 2022-02-25 DIAGNOSIS — M5459 Other low back pain: Secondary | ICD-10-CM | POA: Diagnosis not present

## 2022-02-25 DIAGNOSIS — M25552 Pain in left hip: Secondary | ICD-10-CM | POA: Diagnosis not present

## 2022-02-25 DIAGNOSIS — G8929 Other chronic pain: Secondary | ICD-10-CM

## 2022-02-25 DIAGNOSIS — R262 Difficulty in walking, not elsewhere classified: Secondary | ICD-10-CM | POA: Diagnosis not present

## 2022-03-03 DIAGNOSIS — M25552 Pain in left hip: Secondary | ICD-10-CM | POA: Diagnosis not present

## 2022-03-03 DIAGNOSIS — R262 Difficulty in walking, not elsewhere classified: Secondary | ICD-10-CM | POA: Diagnosis not present

## 2022-03-03 DIAGNOSIS — M5459 Other low back pain: Secondary | ICD-10-CM | POA: Diagnosis not present

## 2022-03-06 DIAGNOSIS — M25552 Pain in left hip: Secondary | ICD-10-CM | POA: Diagnosis not present

## 2022-03-06 DIAGNOSIS — M5459 Other low back pain: Secondary | ICD-10-CM | POA: Diagnosis not present

## 2022-03-06 DIAGNOSIS — R262 Difficulty in walking, not elsewhere classified: Secondary | ICD-10-CM | POA: Diagnosis not present

## 2022-03-07 NOTE — Progress Notes (Signed)
? ?Cardiology Office Note   ? ?Date:  03/11/2022  ? ?ID:  KEALY LEWTER, DOB [birth year], MRN [id number] ? ?PCP:  Alba Cory, MD  ?Cardiologist:  Yvonne Kendall, MD  ?Electrophysiologist:  None  ? ?Chief Complaint: Follow-up ? ?History of Present Illness:  ? ?Christie Hanson is a 76 y.o. female with history of HTN, atrial tachycardia, PACs, hyperglycemia, asthma, vertigo, GERD, depression, and obesity who presents for follow up of HTN. ?  ?She was evaluated as a new patient by Dr. Okey Dupre on 04/04/2021 at the request of her PCP for hypertension. At that visit, she noted elevated BP was just recently diagnosed when she presented to her orthopedist for joint injections, though did report, in hindsight, her BPs had been elevated for several months at home. She had been seen in the ED in 02/2021 with elevated BP of > 200 mmHg at home with reassuring work up and recommendation for her to follow up with her PCP. She also noted sporadic palpitations with her heart rate increasing up to 140 bpm, with episodes usually lasting 15 seconds in duration, with 1 episode lasting approximately 40 minutes before spontaneous resolution. She noted, in the past, her palpitations were associated with excess caffeine intake, though this had not been the case with those more recent episodes. With regards to her BP, she had been started on HCTZ and Bystolic by her PCP in early 03/2021, with subsequent development of fatigue and heart rates in the 50s bpm at times. At her visit with Dr. Okey Dupre, her BP was reasonably controlled at 136/70 with a heart rate of 59 bpm. Her Bystolic was decreased to 5 mg and sodium restriction was advised. Subsequent outpatient cardiac monitoring showed a predominant rhythm of sinus with an average heart rate of 55 bpm (range 43-96 bpm in sinus), occasional PACs representing a 3.9% burden, rare PVCs, 4 atrial runs lasting up to 7 beats with a maximum rate of 148 bpm, and no sustained arrhythmias or prolonged  pauses noted. Patient triggered events corresponded to sinus rhythm, PACs, and PVCs.  ?  ?She was seen in follow-up on 05/20/2021, and continued to note significant fatigue and somnolence since initiating Bystolic.  She reported prior to initiating Bystolic, her activity level was much better.  Home BPs were in the 1 teens to 130s over 60s to 70s.  Her palpitations remain largely unchanged despite cessation of caffeine.  Given continued fatigue, Bystolic was discontinued.  She was placed on Cardizem LA 120 mg daily. ?  ?She was seen in the office in 06/2021 and noted improvement in her underlying fatigue following discontinuation of beta-blocker.  She did feel like Cardizem has helped with her palpitations, however since initiating this medication she noted an increase in nightmares and difficulty falling asleep with noted mind racing.  She also noted some lower extremity swelling following the initiation of Cardizem.  There was an increase in her vertigo.  BP at home has been well controlled in the 130s to 140s mmHg systolic with heart rates in the 60s to 70s bpm off beta-blocker.  She noted xerostomia with chlorthalidone.  Given nightmares, Cardizem was discontinued with recommendation for watchful waiting regarding palpitations and recommendation for her to take as needed short acting diltiazem for sustained tachypalpitations.  With noted xerostomia, chlorthalidone was discontinued and she was started on losartan 25 mg. ?  ?She was seen in the office on 07/31/2021 and was doing well from a cardiac perspective.  She was under increased stress at  home with the help of several family members.  With this, she noted an uptrend in her BP into the 140s to 150s systolic.  She had not needed any as needed diltiazem.  She was tolerating losartan without issues.  With the discontinuation of chlorthalidone on a daily basis she noted resolution of xerostomia.  Her weight was up approximately 4 pounds since her prior visit.  She  was started back on chlorthalidone 12.5 mg every other day.  She was last seen in the office on 09/08/2021 and continued to do well from a cardiac perspective.  She felt like she was back to her prior baseline.  Home BPs were reasonably controlled.  She had not needed any as needed diltiazem. ? ?She comes in doing well from a cardiac perspective and is without symptoms of angina or decompensation.  Since she was last seen she had 1 episode of tachypalpitations that occurred in a stressful situation and following albuterol usage.  She did not need any as needed diltiazem.  Otherwise, she has been asymptomatic.  Blood pressure remains well controlled typically in the 130s over 80s at home.  No dizziness, presyncope, or syncope.  Overall, she is pleased with her progress and does not have any active issues or concerns at this time. ? ? ?Labs independently reviewed: ?06/2021 - potassium 4.2, BUN 17, serum creatinine 0.83, TSH normal, Hgb 13.5, PLT 261, magnesium 2.2, A1c 5.5 ?07/2020 - albumin 4.3, AST/ALT normal ?01/2016 - TC 156, TG 86, HDL 45, LDL 94 ? ?Past Medical History:  ?Diagnosis Date  ? Allergy   ? Asthma   ? wheezing  ? Blurred vision   ? Candidiasis of mouth   ? Depression   ? hx of  ? GERD (gastroesophageal reflux disease)   ? Herniated disc, cervical   ? History of carpal tunnel release   ? Hyperglycemia   ? Metabolic syndrome   ? Obesity   ? Osteoarthritis   ? Ovarian deficiency   ? Vitamin D deficiency   ? ? ?Past Surgical History:  ?Procedure Laterality Date  ? ABDOMINAL HYSTERECTOMY  1991  ? BILATERAL SALPINGOOPHORECTOMY    ? CARPAL TUNNEL RELEASE Bilateral [id number]  ? [facility]  ? CATARACT EXTRACTION W/PHACO Left 09/23/2015  ? Procedure: CATARACT EXTRACTION PHACO AND INTRAOCULAR LENS PLACEMENT (IOC);  Surgeon: Lockie Molahadwick Brasington, MD;  Location: [facility] ORS;  Service: Ophthalmology;  Laterality: Left;  US 00:57 ?AP% 12.2 ?6.98 CDE ?Fluid pack lot # [id number]  ? CATARACT EXTRACTION W/PHACO Right 10/17/2015  ?  Procedure: CATARACT EXTRACTION PHACO AND INTRAOCULAR LENS PLACEMENT (IOC);  Surgeon: Lockie Molahadwick Brasington, MD;  Location: [facility] ORS;  Service: Ophthalmology;  Laterality: Right;  US  00:30.5 ?AP   00:47.2 ?CDE  6.05 ?casette lot #[id number]  ? DandC    ? DILATION AND CURETTAGE OF UTERUS  1990  ? HERNIA REPAIR  1992  ? ventral  ? ? ?Current Medications: ?Current Meds  ?Medication Sig  ? acetaminophen (TYLENOL) 500 MG tablet Take 500 mg by mouth in the morning, at noon, and at bedtime.  ? albuterol (PROAIR HFA) 108 (90 Base) MCG/ACT inhaler INHALE TWO PUFFS EVERY 6 HOURS AS NEEDEDWHEEZING  ? chlorthalidone (HYGROTON) 25 MG tablet Take 0.5 tablets (12.5 mg total) by mouth every other day.  ? Cholecalciferol (VITAMIN D) 2000 UNITS tablet Take 1 tablet by mouth daily. Bedtime  ? esomeprazole (NEXIUM) 40 MG capsule Take 1 capsule (40 mg total) by mouth daily.  ? FLUoxetine (PROZAC) 20 MG tablet Take 1  tablet (20 mg total) by mouth daily.  ? Fluticasone Furoate (ARNUITY ELLIPTA) 100 MCG/ACT AEPB Inhale 1 puff into the lungs daily.  ? losartan (COZAAR) 25 MG tablet Take 1 tablet (25 mg total) by mouth daily.  ? Multiple Vitamins-Minerals (ICAPS AREDS 2) CAPS Take 2 capsules by mouth daily.   ? Probiotic Product (UP4 PROBIOTICS WOMENS PO) Take by mouth daily.  ? vitamin B-12 (CYANOCOBALAMIN) 1000 MCG tablet Take 1,000 mcg by mouth daily. Bedtime  ? ? ?Allergies:   Breo ellipta [fluticasone furoate-vilanterol], Duloxetine, Morphine, Penicillins, and Sulfa antibiotics  ? ?Social History  ? ?Socioeconomic History  ? Marital status: Married  ?  Spouse name: Maisie Fus  ? Number of children: 4  ? Years of education: Not on file  ? Highest education level: Associate degree: occupational, Scientist, product/process development, or vocational program  ?Occupational History  ? Occupation: Retired  ?Tobacco Use  ? Smoking status: Never  ? Smokeless tobacco: Never  ? Tobacco comments:  ?  smoking cessation materials not required  ?Vaping Use  ? Vaping Use: Never used   ?Substance and Sexual Activity  ? Alcohol use: No  ?  Alcohol/week: 0.0 standard drinks  ? Drug use: No  ? Sexual activity: Yes  ?  Partners: Male  ?Other Topics Concern  ? Not on file  ?Social History Narrative

## 2022-03-09 DIAGNOSIS — M25552 Pain in left hip: Secondary | ICD-10-CM | POA: Diagnosis not present

## 2022-03-09 DIAGNOSIS — R262 Difficulty in walking, not elsewhere classified: Secondary | ICD-10-CM | POA: Diagnosis not present

## 2022-03-09 DIAGNOSIS — M5459 Other low back pain: Secondary | ICD-10-CM | POA: Diagnosis not present

## 2022-03-11 ENCOUNTER — Other Ambulatory Visit
Admission: RE | Admit: 2022-03-11 | Discharge: 2022-03-11 | Disposition: A | Discharge status: Home / Self Care | Payer: Medicare PPO | Attending: Physician Assistant | Admitting: Physician Assistant

## 2022-03-11 ENCOUNTER — Ambulatory Visit: Payer: Medicare PPO | Admitting: Physician Assistant

## 2022-03-11 ENCOUNTER — Encounter: Payer: Self-pay | Admitting: Physician Assistant

## 2022-03-11 VITALS — BP 126/70 | HR 66 | Ht 64.0 in | Wt 264.0 lb

## 2022-03-11 DIAGNOSIS — Z79899 Other long term (current) drug therapy: Secondary | ICD-10-CM | POA: Insufficient documentation

## 2022-03-11 DIAGNOSIS — I10 Essential (primary) hypertension: Secondary | ICD-10-CM | POA: Diagnosis not present

## 2022-03-11 DIAGNOSIS — I491 Atrial premature depolarization: Secondary | ICD-10-CM

## 2022-03-11 DIAGNOSIS — I471 Supraventricular tachycardia: Secondary | ICD-10-CM

## 2022-03-11 DIAGNOSIS — R5383 Other fatigue: Secondary | ICD-10-CM

## 2022-03-11 LAB — BASIC METABOLIC PANEL
Anion gap: 8 (ref 5–15)
BUN: 25 mg/dL — ABNORMAL HIGH (ref 8–23)
CO2: 31 mmol/L (ref 22–32)
Calcium: 9.2 mg/dL (ref 8.9–10.3)
Chloride: 91 mmol/L — ABNORMAL LOW (ref 98–111)
Creatinine, Ser: 0.85 mg/dL (ref 0.44–1.00)
GFR, Estimated: >60 mL/min (ref >60)
Glucose, Bld: 86 mg/dL (ref 70–99)
Potassium: 4.5 mmol/L (ref 3.5–5.1)
Sodium: 130 mmol/L — ABNORMAL LOW (ref 135–145)

## 2022-03-11 NOTE — Patient Instructions (Signed)
Medication Instructions:  ? ?Your physician recommends that you continue on your current medications as directed. Please refer to the Current Medication list given to you today. ? ?*If you need a refill on your cardiac medications before your next appointment, please call your pharmacy* ? ? ?Lab Work: ? ?Today at the medical mall: [facility] ? ?-  Please go to the [facility].  ?-  You will check in at the front desk to the right as you walk into the atrium.  ? ?If you have labs (blood work) drawn today and your tests are completely normal, you will receive your results only by: ?MyChart Message (if you have MyChart) OR ?A paper copy in the mail ?If you have any lab test that is abnormal or we need to change your treatment, we will call you to review the results. ? ? ?Testing/Procedures: ? ?None ordered ? ? ?Follow-Up: ?At [facility], you and your health needs are our priority.  As part of our continuing mission to provide you with exceptional heart care, we have created designated Provider Care Teams.  These Care Teams include your primary Cardiologist (physician) and Advanced Practice Providers (APPs -  Physician Assistants and Nurse Practitioners) who all work together to provide you with the care you need, when you need it. ? ?We recommend signing up for the patient portal called "MyChart".  Sign up information is provided on this After Visit Summary.  MyChart is used to connect with patients for Virtual Visits (Telemedicine).  Patients are able to view lab/test results, encounter notes, upcoming appointments, etc.  Non-urgent messages can be sent to your provider as well.   ?To learn more about what you can do with MyChart, go to [email or web address].   ? ?Your next appointment:   ?6 month(s) ? ?The format for your next appointment:   ?In Person ? ?Provider:   ?You will see one of the following Advanced Practice Providers on your designated Care Team:   ? ?Christell Faith, PA-C ? ?Important Information About  [facility] ? ? ? ? ? ? ?

## 2022-03-12 ENCOUNTER — Telehealth: Payer: Self-pay | Admitting: *Deleted

## 2022-03-12 MED ORDER — CHLORTHALIDONE 25 MG PO TABS: 12.5000 mg | ORAL_TABLET | ORAL | 3 refills | Status: DC | PRN

## 2022-03-12 NOTE — Telephone Encounter (Signed)
-----   Message from Sondra Barges, PA-C sent at 03/11/2022 12:45 PM EDT ----- ?Sodium and chloride mildly low, BUN mildly elevated, potassium at goal. These changes are likely due to chlorthalidone. Recommend she stop chlorthalidone and take only 12.5 mg prn for swelling or SOB. Before increasing losartan right away (since she was only taking chlorthalidone every other day), I would like for her to monitor her BP and let us know if it starts to consistently run > 140/90.   ?

## 2022-03-12 NOTE — Telephone Encounter (Signed)
Reviewed results and recommendations with patient. Discussed instructions to change chlorthalidone to as needed for weight gain or swelling and to monitor her blood pressure readings 2 hours after medications. Requested that she keep a log of those readings and to send them via My Chart so we can determine if any further changes are needed. She verbalized understanding of all results, recommendations, and had no further questions at this time.  ?

## 2022-03-13 DIAGNOSIS — M25552 Pain in left hip: Secondary | ICD-10-CM | POA: Diagnosis not present

## 2022-03-13 DIAGNOSIS — R262 Difficulty in walking, not elsewhere classified: Secondary | ICD-10-CM | POA: Diagnosis not present

## 2022-03-13 DIAGNOSIS — M5459 Other low back pain: Secondary | ICD-10-CM | POA: Diagnosis not present

## 2022-03-16 DIAGNOSIS — M5416 Radiculopathy, lumbar region: Secondary | ICD-10-CM | POA: Diagnosis not present

## 2022-03-16 DIAGNOSIS — M5136 Other intervertebral disc degeneration, lumbar region: Secondary | ICD-10-CM | POA: Diagnosis not present

## 2022-03-16 DIAGNOSIS — M48061 Spinal stenosis, lumbar region without neurogenic claudication: Secondary | ICD-10-CM | POA: Diagnosis not present

## 2022-03-18 DIAGNOSIS — R262 Difficulty in walking, not elsewhere classified: Secondary | ICD-10-CM | POA: Diagnosis not present

## 2022-03-18 DIAGNOSIS — M5459 Other low back pain: Secondary | ICD-10-CM | POA: Diagnosis not present

## 2022-03-18 DIAGNOSIS — M25552 Pain in left hip: Secondary | ICD-10-CM | POA: Diagnosis not present

## 2022-03-20 DIAGNOSIS — R262 Difficulty in walking, not elsewhere classified: Secondary | ICD-10-CM | POA: Diagnosis not present

## 2022-03-20 DIAGNOSIS — M5459 Other low back pain: Secondary | ICD-10-CM | POA: Diagnosis not present

## 2022-03-20 DIAGNOSIS — M25552 Pain in left hip: Secondary | ICD-10-CM | POA: Diagnosis not present

## 2022-03-23 DIAGNOSIS — M25552 Pain in left hip: Secondary | ICD-10-CM | POA: Diagnosis not present

## 2022-03-23 DIAGNOSIS — M5459 Other low back pain: Secondary | ICD-10-CM | POA: Diagnosis not present

## 2022-03-23 DIAGNOSIS — R262 Difficulty in walking, not elsewhere classified: Secondary | ICD-10-CM | POA: Diagnosis not present

## 2022-03-26 DIAGNOSIS — M25552 Pain in left hip: Secondary | ICD-10-CM | POA: Diagnosis not present

## 2022-03-26 DIAGNOSIS — M5459 Other low back pain: Secondary | ICD-10-CM | POA: Diagnosis not present

## 2022-03-26 DIAGNOSIS — R262 Difficulty in walking, not elsewhere classified: Secondary | ICD-10-CM | POA: Diagnosis not present

## 2022-04-08 ENCOUNTER — Other Ambulatory Visit: Payer: Self-pay | Admitting: *Deleted

## 2022-04-08 DIAGNOSIS — R42 Dizziness and giddiness: Secondary | ICD-10-CM

## 2022-04-08 DIAGNOSIS — I491 Atrial premature depolarization: Secondary | ICD-10-CM

## 2022-04-08 DIAGNOSIS — I471 Supraventricular tachycardia: Secondary | ICD-10-CM

## 2022-04-08 DIAGNOSIS — R5383 Other fatigue: Secondary | ICD-10-CM

## 2022-04-08 DIAGNOSIS — R002 Palpitations: Secondary | ICD-10-CM

## 2022-04-08 DIAGNOSIS — Z79899 Other long term (current) drug therapy: Secondary | ICD-10-CM

## 2022-04-08 MED ORDER — FUROSEMIDE 20 MG PO TABS: 20.0000 mg | ORAL_TABLET | Freq: Every day | ORAL | 3 refills | Status: DC

## 2022-04-22 ENCOUNTER — Other Ambulatory Visit
Admission: RE | Admit: 2022-04-22 | Discharge: 2022-04-22 | Disposition: A | Discharge status: Home / Self Care | Payer: Medicare PPO | Attending: Physician Assistant | Admitting: Physician Assistant

## 2022-04-22 DIAGNOSIS — Z79899 Other long term (current) drug therapy: Secondary | ICD-10-CM | POA: Diagnosis not present

## 2022-04-22 DIAGNOSIS — I4719 Other supraventricular tachycardia: Secondary | ICD-10-CM

## 2022-04-22 DIAGNOSIS — I471 Supraventricular tachycardia: Secondary | ICD-10-CM | POA: Insufficient documentation

## 2022-04-22 DIAGNOSIS — R5383 Other fatigue: Secondary | ICD-10-CM | POA: Diagnosis not present

## 2022-04-22 DIAGNOSIS — I491 Atrial premature depolarization: Secondary | ICD-10-CM

## 2022-04-22 DIAGNOSIS — R002 Palpitations: Secondary | ICD-10-CM

## 2022-04-22 DIAGNOSIS — R42 Dizziness and giddiness: Secondary | ICD-10-CM

## 2022-04-22 LAB — BASIC METABOLIC PANEL
Anion gap: 5 (ref 5–15)
BUN: 17 mg/dL (ref 8–23)
CO2: 29 mmol/L (ref 22–32)
Calcium: 9.1 mg/dL (ref 8.9–10.3)
Chloride: 102 mmol/L (ref 98–111)
Creatinine, Ser: 0.74 mg/dL (ref 0.44–1.00)
GFR, Estimated: >60 mL/min (ref >60)
Glucose, Bld: 100 mg/dL — ABNORMAL HIGH (ref 70–99)
Potassium: 4.8 mmol/L (ref 3.5–5.1)
Sodium: 136 mmol/L (ref 135–145)

## 2022-05-11 DIAGNOSIS — H353132 Nonexudative age-related macular degeneration, bilateral, intermediate dry stage: Secondary | ICD-10-CM | POA: Diagnosis not present

## 2022-06-10 ENCOUNTER — Other Ambulatory Visit: Payer: Self-pay | Admitting: Physician Assistant

## 2022-08-10 NOTE — Progress Notes (Unsigned)
Name: Christie Hanson   MRN: [id number]    DOB: 08-12-1946   Date:08/11/2022       Progress Note  Subjective  Chief Complaint  Follow Up  HPI  Metabolic Syndrome: last hgbA1C was 5.5%, she denies polyphagia, polydipsia or polyuria. Unchanged   Hypertension Benign: she could not tolerate Bystolic, she is now on Losartan and chlorthalidone half a pill every other day , on cardizem only prn for palpitation and not very often. Continue medication  Zio patch 03/2021: The patient was monitored for 14 days. The predominant rhythm was sinus with an average rate of 55 bpm (range 43-96 bpm and sinus). There were occasional PACs (3.9% burden) and rare PVCs. 4 atrial runs lasting up to 7 beats with a maximum rate of 148 bpm were observed. No sustained arrhythmia or prolonged pause was seen. Patient triggered events correspond to sinus rhythm, PACs, and PVCs.   Predominantly sinus rhythm with occasional PACs and rare PVCs.  A few brief episodes of PSVT also noted.   Asthma Moderate: She is now on Arnuity only and her  symptoms are controlled, no wheezing, she has chronic SOB and likely multifactorial    GERD: she has been doing well with Nexium daily and pepcid prn, lost some weight and she also avoids eating triggering food   Neuropathy: she states she has burning sensation on both feet for many years, she is on B12 supplementation, she states symptoms have been under control . Stable.    Meralgia paresthetica: it was present for many years, right lateral thigh, not associated with weakness, she states pain is not as severe since she lost weight.    Depression: she stopped taking all medication back in 2017.Took Prozac and Abilify for many years prior to that . She stopped because of side effects, like tremors Tried cymbalta but it made her feel "weird" and stopped medication, it made her feel anxious, however decided to go back on Prozac 2022  she is is more pain and has been "grumpy". She  states she had to stop sewing due to back pain.    Obesity: weight is stable since last visit - 6 months ago - it was 261 lbs and today [place] 265 lbs. She states she will go back to the [facility]. She states she will try to move more now that weather is cooler    Vitamin D and Vitamin B12 deficiency: back to normal still on supplementation.    OA: she  has been less active and pain on both knees, currently seeing Dr. Landry Mellow at [facility], she had hyaluronic acid injections and helped a little initially but since no significant improvement and heart problems started she stopped going to see him . She is avoiding standing up when doing things around the house    Senile purpura: both arms, she has recurrent excoriations and advised again to take [employer]   Chronic low back pain:  with a flare, she states tripped and fell at home in July 22  and fell again August 22, the pain was only on lower back and aching like, but around January 23  she noticed increase in pain that was radiating down left lower leg, seen by Dr. Caralee Ates and was given prednisone taper and pain improved for 3 days but returned, she went to PT and states using the pool at [facility] has helped . No longer having sciatica Pain now is aching like and average 3/10. Not affecting her sleep. Worse when twisting or  bending over   Vertigo:she states symptoms usually when going to bed, getting out of bed or rolling in bed. She tries  Epley maneuver  at home, at times it causes nausea . It does not happen every night and wants to hold off on seeing ENT  Patient Active Problem List   Diagnosis Date Noted   Essential hypertension 04/06/2021   Palpitations 04/06/2021   BMI 45.0-49.9, adult (HCC) 02/23/2020   Senile purpura (HCC) 03/14/2018   Age-related macular degeneration 03/14/2018   Morbid obesity (HCC) 03/14/2018   Peripheral polyneuropathy 03/02/2017   Vitamin B12 deficiency 07/29/2015   Carpal tunnel syndrome 07/27/2015   Osteoarthritis  07/27/2015   Depression, major, recurrent, mild (HCC) 07/27/2015   Gastro-esophageal reflux disease without esophagitis 07/27/2015   Displacement of cervical intervertebral disc without myelopathy 07/27/2015   Dysmetabolic syndrome 07/27/2015   Vitamin D deficiency 07/27/2015   Sprain of rotator cuff capsule 07/27/2015   Urgency of urination 07/27/2015   Allergic rhinitis 07/27/2015   Asthma, moderate persistent 07/27/2015    Past Surgical History:  Procedure Laterality Date   ABDOMINAL HYSTERECTOMY  1991   BILATERAL SALPINGOOPHORECTOMY     CARPAL TUNNEL RELEASE Bilateral [id number]   [facility]   CATARACT EXTRACTION W/PHACO Left 09/23/2015   Procedure: CATARACT EXTRACTION PHACO AND INTRAOCULAR LENS PLACEMENT (IOC);  Surgeon: Lockie Mola, MD;  Location: [facility] ORS;  Service: Ophthalmology;  Laterality: Left;  [place] 00:57 AP% 12.2 6.98 CDE Fluid pack lot # [id number]   CATARACT EXTRACTION W/PHACO Right 10/17/2015   Procedure: CATARACT EXTRACTION PHACO AND INTRAOCULAR LENS PLACEMENT (IOC);  Surgeon: Lockie Mola, MD;  Location: [facility] ORS;  Service: Ophthalmology;  Laterality: Right;  [place]  00:30.5 AP   00:47.2 CDE  6.05 casette lot #[id number]   DandC     DILATION AND CURETTAGE OF UTERUS  1990   HERNIA REPAIR  1992   ventral    Family History  Problem Relation Age of Onset   Hypertension Mother    Cancer Father        prostate   CVA Father    Depression Father    Healthy Sister    Cancer Brother        prostate    Social History   Tobacco Use   Smoking status: Never   Smokeless tobacco: Never   Tobacco comments:    smoking cessation materials not required  Substance Use Topics   Alcohol use: No    Alcohol/week: 0.0 standard drinks of alcohol     Current Outpatient Medications:    acetaminophen (TYLENOL) 500 MG tablet, Take 500 mg by mouth in the morning, at noon, and at bedtime., Disp: , Rfl:    albuterol (PROAIR HFA) 108 (90 Base) MCG/ACT inhaler, INHALE TWO PUFFS  EVERY 6 HOURS AS NEEDEDWHEEZING, Disp: 8.5 g, Rfl: 1   Cholecalciferol (VITAMIN D) 2000 UNITS tablet, Take 1 tablet by mouth daily. Bedtime, Disp: , Rfl:    esomeprazole (NEXIUM) 40 MG capsule, Take 1 capsule (40 mg total) by mouth daily., Disp: 90 capsule, Rfl: 1   FLUoxetine (PROZAC) 20 MG tablet, Take 1 tablet (20 mg total) by mouth daily., Disp: 90 tablet, Rfl: 1   Fluticasone Furoate (ARNUITY ELLIPTA) 100 MCG/ACT AEPB, Inhale 1 puff into the lungs daily., Disp: 30 each, Rfl: 5   losartan (COZAAR) 25 MG tablet, TAKE 1 TABLET (25 MG TOTAL) BY MOUTH DAILY., Disp: 90 tablet, Rfl: 3   Multiple Vitamins-Minerals (ICAPS AREDS 2) CAPS, Take 2 capsules by mouth daily. ,  Disp: , Rfl:    Probiotic Product (UP4 PROBIOTICS WOMENS PO), Take by mouth daily., Disp: , Rfl:    vitamin B-12 (CYANOCOBALAMIN) 1000 MCG tablet, Take 1,000 mcg by mouth daily. Bedtime, Disp: , Rfl:    chlorthalidone (HYGROTON) 25 MG tablet, Take 0.5 tablets (12.5 mg total) by mouth as needed for up to 90 doses (As needed for swelling or weight gain)., Disp: 7.5 tablet, Rfl: 3   furosemide (LASIX) 20 MG tablet, Take 1 tablet (20 mg total) by mouth daily., Disp: 90 tablet, Rfl: 3  Allergies  Allergen Reactions   Breo Ellipta [Fluticasone Furoate-Vilanterol]     Palpitation    Duloxetine     Mental fogginess    Morphine    Penicillins    Sulfa Antibiotics     I personally reviewed active problem list, medication list, allergies, family history, social history, health maintenance with the patient/caregiver today.   ROS  Constitutional: Negative for fever or weight change.  Respiratory: Negative for cough and shortness of breath.   Cardiovascular: Negative for chest pain or palpitations.  Gastrointestinal: Negative for abdominal pain, no bowel changes.  Musculoskeletal: Negative for gait problem or joint swelling.  Skin: Negative for rash.  Neurological: Negative for dizziness or headache.  No other specific complaints in  a complete review of systems (except as listed in HPI above).   Objective  Vitals:   08/11/22 1114  BP: 136/70  Pulse: 81  Resp: 16  SpO2: 98%  Weight: 265 lb (120.2 kg)  Height: 5\' 4"  (1.626 m)    Body mass index is 45.49 kg/m.  Physical Exam  Constitutional: Patient appears well-developed and well-nourished. Obese  No distress.  HEENT: head atraumatic, normocephalic, pupils equal and reactive to light, neck supple Cardiovascular: Normal rate, regular rhythm and normal heart sounds.  No murmur heard. No BLE edema. Pulmonary/Chest: Effort normal and breath sounds normal. No respiratory distress. Abdominal: Soft.  There is no tenderness. Psychiatric: Patient has a normal mood and affect. behavior is normal. Judgment and thought content normal.  Muscular Skeletal:no crepitus with extension of his knee, using a cane, no pain during palpation of lumbar spine    PHQ2/9:    08/11/2022   11:08 AM 02/05/2022    9:42 AM 12/22/2021   12:59 PM 08/08/2021   11:18 AM 03/10/2021    2:06 PM  Depression screen PHQ 2/9  Decreased Interest 2 2 1 1 1   Down, Depressed, Hopeless 0 1 1 1  0  PHQ - 2 Score 2 3 2 2 1   Altered sleeping 3 0 0 0 0  Tired, decreased energy 3 3 0 1 1  Change in appetite 0 0 0 0 0  Feeling bad or failure about yourself  0 0 0 1 0  Trouble concentrating 0 0 0 0 0  Moving slowly or fidgety/restless 0 0 0 0 0  Suicidal thoughts 0 0 0 0 0  PHQ-9 Score 8 6 2 4 2     phq 9 is positive   Fall Risk:    08/11/2022   11:08 AM 02/05/2022    9:42 AM 12/22/2021   12:59 PM 08/08/2021   11:18 AM 03/10/2021    2:06 PM  Fall Risk   Falls in the past year? 0 0 1 1 0  Number falls in past yr: 0 0 1 1 0  Injury with Fall? 0 0 0 1 0  Risk for fall due to : No Fall Risks Impaired balance/gait Impaired balance/gait Impaired  mobility   Follow up Falls prevention discussed Falls prevention discussed Falls prevention discussed Falls prevention discussed       Functional Status  Survey: Is the patient deaf or have difficulty hearing?: No Does the patient have difficulty seeing, even when wearing glasses/contacts?: Yes Does the patient have difficulty concentrating, remembering, or making decisions?: Yes Does the patient have difficulty walking or climbing stairs?: Yes Does the patient have difficulty dressing or bathing?: Yes Does the patient have difficulty doing errands alone such as visiting a doctor's office or shopping?: No    Assessment & Plan  1. Depression, major, recurrent, mild (HCC)  - FLUoxetine (PROZAC) 20 MG tablet; Take 1 tablet (20 mg total) by mouth daily.  Dispense: 90 tablet; Refill: 1  2. Senile purpura (HCC)   3. Morbid obesity (HCC)  Discussed with the patient the risk posed by an increased BMI. Discussed importance of portion control, calorie counting and at least 150 minutes of physical activity weekly. Avoid sweet beverages and drink more water. Eat at least 6 servings of fruit and vegetables daily    4. Asthma, moderate persistent, well-controlled  - Fluticasone Furoate (ARNUITY ELLIPTA) 100 MCG/ACT AEPB; Inhale 1 puff into the lungs daily.  Dispense: 30 each; Refill: 5  5. PAC (premature atrial contraction)   6. Need for immunization against influenza  - Flu Vaccine QUAD High Dose(Fluad)  7. Primary osteoarthritis involving multiple joints   8. Spinal stenosis of lumbosacral region   9. Gastro-esophageal reflux disease without esophagitis  - esomeprazole (NEXIUM) 40 MG capsule; Take 1 capsule (40 mg total) by mouth daily.  Dispense: 90 capsule; Refill: 1 - famotidine (PEPCID) 40 MG tablet; Take 1 tablet (40 mg total) by mouth at bedtime.  Dispense: 90 tablet; Refill: 0  10. Hyperglycemia   11. Benign hypertension   12. Vitamin B12 deficiency  Continue supplementation   13. Vitamin D deficiency   Continue supplementation

## 2022-08-11 ENCOUNTER — Ambulatory Visit: Payer: Medicare PPO | Admitting: Family Medicine

## 2022-08-11 ENCOUNTER — Encounter: Payer: Self-pay | Admitting: Family Medicine

## 2022-08-11 VITALS — BP 136/70 | HR 81 | Resp 16 | Ht 64.0 in | Wt 265.0 lb

## 2022-08-11 DIAGNOSIS — E538 Deficiency of other specified B group vitamins: Secondary | ICD-10-CM

## 2022-08-11 DIAGNOSIS — K219 Gastro-esophageal reflux disease without esophagitis: Secondary | ICD-10-CM

## 2022-08-11 DIAGNOSIS — I10 Essential (primary) hypertension: Secondary | ICD-10-CM

## 2022-08-11 DIAGNOSIS — D692 Other nonthrombocytopenic purpura: Secondary | ICD-10-CM

## 2022-08-11 DIAGNOSIS — I491 Atrial premature depolarization: Secondary | ICD-10-CM

## 2022-08-11 DIAGNOSIS — M4807 Spinal stenosis, lumbosacral region: Secondary | ICD-10-CM | POA: Diagnosis not present

## 2022-08-11 DIAGNOSIS — M159 Polyosteoarthritis, unspecified: Secondary | ICD-10-CM

## 2022-08-11 DIAGNOSIS — F330 Major depressive disorder, recurrent, mild: Secondary | ICD-10-CM | POA: Diagnosis not present

## 2022-08-11 DIAGNOSIS — Z23 Encounter for immunization: Secondary | ICD-10-CM | POA: Diagnosis not present

## 2022-08-11 DIAGNOSIS — J4540 Moderate persistent asthma, uncomplicated: Secondary | ICD-10-CM

## 2022-08-11 DIAGNOSIS — R739 Hyperglycemia, unspecified: Secondary | ICD-10-CM

## 2022-08-11 DIAGNOSIS — E559 Vitamin D deficiency, unspecified: Secondary | ICD-10-CM

## 2022-08-11 MED ORDER — FLUOXETINE HCL 20 MG PO TABS: 20.0000 mg | ORAL_TABLET | Freq: Every day | ORAL | 1 refills | Status: DC

## 2022-08-11 MED ORDER — ARNUITY ELLIPTA 100 MCG/ACT IN AEPB: 1.0000 | INHALATION_SPRAY | Freq: Every day | RESPIRATORY_TRACT | 5 refills | Status: DC

## 2022-08-11 MED ORDER — ESOMEPRAZOLE MAGNESIUM 40 MG PO CPDR: 40.0000 mg | DELAYED_RELEASE_CAPSULE | Freq: Every day | ORAL | 1 refills | Status: DC

## 2022-08-11 MED ORDER — FAMOTIDINE 40 MG PO TABS: 40.0000 mg | ORAL_TABLET | Freq: Every day | ORAL | 0 refills | Status: DC

## 2022-09-11 NOTE — Progress Notes (Unsigned)
Cardiology Office Note    Date:  09/14/2022   ID:  Roniesha, Hollingshead [birth year], MRN [id number]  PCP:  Alba Cory, MD  Cardiologist:  Yvonne Kendall, MD  Electrophysiologist:  None   Chief Complaint: Follow-up  History of Present Illness:   Christie Hanson is a 76 y.o. female with history of HTN, atrial tachycardia, PACs, hyperglycemia, asthma, vertigo, GERD, depression, and obesity who presents for follow up of HTN.   She was evaluated as a new patient by Dr. Okey Dupre on 04/04/2021 at the request of her PCP for hypertension. At that visit, she noted elevated BP was just recently diagnosed when she presented to her orthopedist for joint injections, though did report, in hindsight, her BPs had been elevated for several months at home. She had been seen in the ED in 02/2021 with elevated BP of > 200 mmHg at home with reassuring work up and recommendation for her to follow up with her PCP. She also noted sporadic palpitations with her heart rate increasing up to 140 bpm, with episodes usually lasting 15 seconds in duration, with 1 episode lasting approximately 40 minutes before spontaneous resolution. She noted, in the past, her palpitations were associated with excess caffeine intake, though this had not been the case with those more recent episodes. With regards to her BP, she had been started on HCTZ and Bystolic by her PCP in early 03/2021, with subsequent development of fatigue and heart rates in the 50s bpm at times. At her visit with Dr. Okey Dupre, her BP was reasonably controlled at 136/70 with a heart rate of 59 bpm. Her Bystolic was decreased to 5 mg and sodium restriction was advised. Subsequent outpatient cardiac monitoring showed a predominant rhythm of sinus with an average heart rate of 55 bpm (range 43-96 bpm in sinus), occasional PACs representing a 3.9% burden, rare PVCs, 4 atrial runs lasting up to 7 beats with a maximum rate of 148 bpm, and no sustained arrhythmias or prolonged  pauses noted. Patient triggered events corresponded to sinus rhythm, PACs, and PVCs.    She was seen in follow-up on 05/20/2021, and continued to note significant fatigue and somnolence since initiating Bystolic.  She reported prior to initiating Bystolic, her activity level was much better.  Home BPs were in the 1 teens to 130s over 60s to 70s.  Her palpitations remained largely unchanged despite cessation of caffeine.  Given continued fatigue, Bystolic was discontinued.  She was placed on Cardizem LA 120 mg daily.   She was seen in the office in 06/2021 and noted improvement in her underlying fatigue following discontinuation of beta-blocker.  She did feel like Cardizem had helped with her palpitations, however since initiating this medication she noted an increase in nightmares and difficulty falling asleep with noted mind racing.  She also noted some lower extremity swelling following the initiation of Cardizem.  There was an increase in her vertigo.  BP at home had been well controlled in the 130s to 140s mmHg systolic with heart rates in the 60s to 70s bpm off beta-blocker.  She noted xerostomia with chlorthalidone.  Given nightmares, Cardizem was discontinued with recommendation for watchful waiting regarding palpitations and recommendation for her to take as needed short acting diltiazem for sustained tachypalpitations.  With noted xerostomia, chlorthalidone was discontinued and she was started on losartan 25 mg.   She was seen in the office on 07/31/2021 and was doing well from a cardiac perspective.  She was under increased stress at  home with the health of several family members.  With this, she noted an uptrend in her BP into the 967E to 938B systolic.  She had not needed any as needed diltiazem.  She was tolerating losartan without issues.  With the discontinuation of chlorthalidone on a daily basis she noted resolution of xerostomia.  Her weight was up approximately 4 pounds since her prior visit.   She was started back on chlorthalidone 12.5 mg every other day.  She was seen in the office on 09/08/2021 and continued to do well from a cardiac perspective.  She felt like she was back to her prior baseline.  Home BPs were reasonably controlled.  She had not needed any as needed diltiazem.  She was last seen in the office in 02/2022 and remained without symptoms of angina or decompensation.  She reported 1 episode of tachypalpitations that occurred during a stressful situation and following albuterol usage.  She did not need any as needed diltiazem.  Blood pressures were typically in the 130s over 80s at home.  She comes in doing well from a cardiac perspective and is without symptoms of angina or decompensation.  Blood pressure has remained well controlled at home typically in the 130s to low 80s.  She only notes intermittent palpitations if she is fatigued following a busy day.  No significant dyspnea, palpitations, dizziness, presyncope, or syncope.  No falls or symptoms concerning for bleeding.  Overall, she feels like she is doing very well from a cardiac perspective and does not have any concerns at this time.   Labs independently reviewed: 04/2022 - potassium 4.8, BUN 17, serum creatinine 0.74 06/2021 - TSH normal, Hgb 13.5, PLT 261, magnesium 2.2, A1c 5.5 07/2020 - albumin 4.3, AST/ALT normal 01/2016 - TC 156, TG 86, HDL 45, LDL 94  Past Medical History:  Diagnosis Date   Allergy    Asthma    wheezing   Blurred vision    Candidiasis of mouth    Depression    hx of   GERD (gastroesophageal reflux disease)    Herniated disc, cervical    History of carpal tunnel release    Hyperglycemia    Metabolic syndrome    Obesity    Osteoarthritis    Ovarian deficiency    Vitamin D deficiency     Past Surgical History:  Procedure Laterality Date   ABDOMINAL HYSTERECTOMY  1991   BILATERAL SALPINGOOPHORECTOMY     CARPAL TUNNEL RELEASE Bilateral [id number]   [facility]   CATARACT EXTRACTION W/PHACO  Left 09/23/2015   Procedure: CATARACT EXTRACTION PHACO AND INTRAOCULAR LENS PLACEMENT ([place]);  Surgeon: Leandrew Koyanagi, MD;  Location: [facility] ORS;  Service: Ophthalmology;  Laterality: Left;  [place] 00:57 AP% 12.2 6.98 CDE Fluid pack lot # [id number]   CATARACT EXTRACTION W/PHACO Right 10/17/2015   Procedure: CATARACT EXTRACTION PHACO AND INTRAOCULAR LENS PLACEMENT ([facility]);  Surgeon: Leandrew Koyanagi, MD;  Location: [facility] ORS;  Service: Ophthalmology;  Laterality: Right;  [place]  00:30.5 AP   00:47.2 CDE  6.05 casette lot #[id number]   DandC     DILATION AND CURETTAGE OF UTERUS  1990   HERNIA REPAIR  1992   ventral    Current Medications: Current Meds  Medication Sig   acetaminophen (TYLENOL) 500 MG tablet Take 500 mg by mouth in the morning, at noon, and at bedtime.   albuterol (PROAIR HFA) 108 (90 Base) MCG/ACT inhaler INHALE TWO PUFFS EVERY 6 HOURS AS NEEDEDWHEEZING   Cholecalciferol (VITAMIN D) 2000 UNITS tablet  Take 1 tablet by mouth daily. Bedtime   esomeprazole (NEXIUM) 40 MG capsule Take 1 capsule (40 mg total) by mouth daily.   famotidine (PEPCID) 40 MG tablet Take 1 tablet (40 mg total) by mouth at bedtime.   FLUoxetine (PROZAC) 20 MG tablet Take 1 tablet (20 mg total) by mouth daily.   Fluticasone Furoate (ARNUITY ELLIPTA) 100 MCG/ACT AEPB Inhale 1 puff into the lungs daily.   furosemide (LASIX) 20 MG tablet Take 1 tablet (20 mg total) by mouth daily.   losartan (COZAAR) 25 MG tablet TAKE 1 TABLET (25 MG TOTAL) BY MOUTH DAILY.   Multiple Vitamins-Minerals (ICAPS AREDS 2) CAPS Take 2 capsules by mouth daily.    Probiotic Product (UP4 PROBIOTICS WOMENS PO) Take by mouth daily.   vitamin B-12 (CYANOCOBALAMIN) 1000 MCG tablet Take 1,000 mcg by mouth daily. Bedtime    Allergies:   Breo ellipta [fluticasone furoate-vilanterol], Duloxetine, Morphine, Penicillins, and Sulfa antibiotics   Social History   Socioeconomic History   Marital status: Married    Spouse name: Maisie Fus   Number of  children: 4   Years of education: Not on file   Highest education level: Associate degree: occupational, Scientist, product/process development, or vocational program  Occupational History   Occupation: Retired  Tobacco Use   Smoking status: Never   Smokeless tobacco: Never   Tobacco comments:    smoking cessation materials not required  Vaping Use   Vaping Use: Never used  Substance and Sexual Activity   Alcohol use: No    Alcohol/week: 0.0 standard drinks of alcohol   Drug use: No   Sexual activity: Yes    Partners: Male  Other Topics Concern   Not on file  Social History Narrative   Not on file   Social Determinants of Health   Financial Resource Strain: Low Risk  (08/23/2018)   Overall Financial Resource Strain (CARDIA)    Difficulty of Paying Living Expenses: Not hard at all  Food Insecurity: No Food Insecurity (08/23/2018)   Hunger Vital Sign    Worried About Running Out of Food in the Last Year: Never true    Ran Out of Food in the Last Year: Never true  Transportation Needs: No Transportation Needs (08/23/2018)   PRAPARE - Administrator, Civil Service (Medical): No    Lack of Transportation (Non-Medical): No  Physical Activity: Inactive (08/23/2018)   Exercise Vital Sign    Days of Exercise per Week: 0 days    Minutes of Exercise per Session: 0 min  Stress: No Stress Concern Present (08/23/2018)   [employer] of Occupational Health - Occupational Stress Questionnaire    Feeling of Stress : Not at all  Social Connections: Unknown (08/23/2018)   Social Connection and Isolation Panel [NHANES]    Frequency of Communication with Friends and Family: Patient refused    Frequency of Social Gatherings with Friends and Family: Patient refused    Attends Religious Services: Patient refused    Database administrator or Organizations: Patient refused    Attends Engineer, structural: Patient refused    Marital Status: Married     Family History:  The patient's family history  includes CVA in her father; Cancer in her brother and father; Depression in her father; Healthy in her sister; Hypertension in her mother.  ROS:   12-point review of systems is negative unless otherwise noted in the HPI.   EKGs/Labs/Other Studies Reviewed:    Studies reviewed were summarized above. The additional  studies were reviewed today:  Zio patch 03/2021: The patient was monitored for 14 days. The predominant rhythm was sinus with an average rate of 55 bpm (range 43-96 bpm and sinus). There were occasional PACs (3.9% burden) and rare PVCs. 4 atrial runs lasting up to 7 beats with a maximum rate of 148 bpm were observed. No sustained arrhythmia or prolonged pause was seen. Patient triggered events correspond to sinus rhythm, PACs, and PVCs.   Predominantly sinus rhythm with occasional PACs and rare PVCs.  A few brief episodes of PSVT also noted.   EKG:  EKG is ordered today.  The EKG ordered today demonstrates NSR with occasional PACs, no acute ST-T changes  Recent Labs: 04/22/2022: BUN 17; Creatinine, Ser 0.74; Potassium 4.8; Sodium 136  Recent Lipid Panel    Component Value Date/Time   CHOL 156 01/24/2016 0920   TRIG 86 01/24/2016 0920   HDL 45 01/24/2016 0920   CHOLHDL 3.5 01/24/2016 0920   LDLCALC 94 01/24/2016 0920    PHYSICAL EXAM:    VS:  BP (!) 157/81 (BP Location: Left Arm, Patient Position: Sitting, Cuff Size: Large)   Pulse 63   Ht 5\' 4"  (1.626 m)   Wt 266 lb 6.4 oz (120.8 kg)   SpO2 96%   BMI 45.73 kg/m   BMI: Body mass index is 45.73 kg/m.  Physical Exam Vitals reviewed.  Constitutional:      Appearance: She is well-developed.  HENT:     Head: Normocephalic and atraumatic.  Eyes:     General:        Right eye: No discharge.        Left eye: No discharge.  Neck:     Vascular: No JVD.  Cardiovascular:     Rate and Rhythm: Normal rate and regular rhythm.     Heart sounds: Normal heart sounds, S1 normal and S2 normal. Heart sounds not distant. No  midsystolic click and no opening snap. No murmur heard.    No friction rub.  Pulmonary:     Effort: Pulmonary effort is normal. No respiratory distress.     Breath sounds: Normal breath sounds. No decreased breath sounds, wheezing or rales.  Chest:     Chest wall: No tenderness.  Abdominal:     General: There is no distension.  Musculoskeletal:     Cervical back: Normal range of motion.  Skin:    General: Skin is warm and dry.     Nails: There is no clubbing.  Neurological:     Mental Status: She is alert and oriented to person, place, and time.  Psychiatric:        Speech: Speech normal.        Behavior: Behavior normal.        Thought Content: Thought content normal.        Judgment: Judgment normal.     Wt Readings from Last 3 Encounters:  09/14/22 266 lb 6.4 oz (120.8 kg)  08/11/22 265 lb (120.2 kg)  03/11/22 264 lb (119.7 kg)     ASSESSMENT & PLAN:   Atrial tachycardia/PACs: Overall, she continues to do very well and has not needed any as needed short acting diltiazem.  Not currently on a standing beta-blocker or nondihydropyridine calcium channel blocker secondary to bradycardia, fatigue, and night terrors respectively.  Continue to minimize caffeine intake.  HTN: Blood pressure is mildly elevated in the office today, though well controlled at home.  She remains on losartan 25 mg daily.  No  longer on chlorthalidone.  Fatigue: Improved following discontinuation of beta-blocker.  Obesity: Weight loss is encouraged through out of the diet and regular exercise.    Disposition: F/u with Dr. Okey Dupre or an APP in 12 months, sooner if needed.   Medication Adjustments/Labs and Tests Ordered: Current medicines are reviewed at length with the patient today.  Concerns regarding medicines are outlined above. Medication changes, Labs and Tests ordered today are summarized above and listed in the Patient Instructions accessible in Encounters.   Signed, Eula Listen, PA-C 09/14/2022  1:28 PM     [facility] [address] [place], [place] [address] [phone]

## 2022-09-14 ENCOUNTER — Encounter: Payer: Self-pay | Admitting: Physician Assistant

## 2022-09-14 ENCOUNTER — Ambulatory Visit: Payer: Medicare PPO | Attending: Physician Assistant | Admitting: Physician Assistant

## 2022-09-14 VITALS — BP 157/81 | HR 63 | Ht 64.0 in | Wt 266.4 lb

## 2022-09-14 DIAGNOSIS — I491 Atrial premature depolarization: Secondary | ICD-10-CM | POA: Diagnosis not present

## 2022-09-14 DIAGNOSIS — I10 Essential (primary) hypertension: Secondary | ICD-10-CM | POA: Diagnosis not present

## 2022-09-14 DIAGNOSIS — I4719 Other supraventricular tachycardia: Secondary | ICD-10-CM

## 2022-09-14 DIAGNOSIS — R5383 Other fatigue: Secondary | ICD-10-CM | POA: Diagnosis not present

## 2022-09-14 MED ORDER — DILTIAZEM HCL 30 MG PO TABS: 30.0000 mg | ORAL_TABLET | Freq: Two times a day (BID) | ORAL | 3 refills | Status: AC | PRN

## 2022-09-14 NOTE — Patient Instructions (Signed)
Medication Instructions:  No changes at this time.   *If you need a refill on your cardiac medications before your next appointment, please call your pharmacy*   Lab Work: None  If you have labs (blood work) drawn today and your tests are completely normal, you will receive your results only by: [place] (if you have MyChart) OR A paper copy in the mail If you have any lab test that is abnormal or we need to change your treatment, we will call you to review the results.   Testing/Procedures: None   Follow-Up: At [facility], you and your health needs are our priority.  As part of our continuing mission to provide you with exceptional heart care, we have created designated Provider Care Teams.  These Care Teams include your primary Cardiologist (physician) and Advanced Practice Providers (APPs -  Physician Assistants and Nurse Practitioners) who all work together to provide you with the care you need, when you need it.   Your next appointment:   1 year(s)  The format for your next appointment:   In Person  Provider:   Nelva Bush, MD or Christell Faith, PA-C      Important Information About Sugar

## 2022-11-03 DIAGNOSIS — H353132 Nonexudative age-related macular degeneration, bilateral, intermediate dry stage: Secondary | ICD-10-CM | POA: Diagnosis not present

## 2022-11-10 ENCOUNTER — Other Ambulatory Visit: Payer: Self-pay | Admitting: Family Medicine

## 2022-11-10 DIAGNOSIS — K219 Gastro-esophageal reflux disease without esophagitis: Secondary | ICD-10-CM

## 2023-02-06 ENCOUNTER — Other Ambulatory Visit: Payer: Self-pay | Admitting: Family Medicine

## 2023-02-06 DIAGNOSIS — F330 Major depressive disorder, recurrent, mild: Secondary | ICD-10-CM

## 2023-02-08 NOTE — Progress Notes (Unsigned)
Name: Christie Hanson   MRN: [id number]    DOB: December 08, 1945   Date:02/09/2023       Progress Note  Subjective  Chief Complaint  Follow Up  HPI  Metabolic Syndrome: last [id number] was 5.5%, she denies polyphagia, polydipsia or polyuria. We will recheck labs   Hypertension Benign: she could not tolerate Bystolic, she is now on Losartan , and lasix , hyponatremia resolved, bp is at goal today. No chest pain or palpitation   Zio patch 03/2021: The patient was monitored for 14 days. The predominant rhythm was sinus with an average rate of 55 bpm (range 43-96 bpm and sinus). There were occasional PACs (3.9% burden) and rare PVCs. 4 atrial runs lasting up to 7 beats with a maximum rate of 148 bpm were observed. No sustained arrhythmia or prolonged pause was seen. Patient triggered events correspond to sinus rhythm, PACs, and PVCs.   Predominantly sinus rhythm with occasional PACs and rare PVCs.  A few brief episodes of PSVT also noted. She has regular visits with cardiologist    Asthma Moderate: She is now on Arnuity only and her  symptoms are controlled, no wheezing, she has chronic SOB and likely multifactorial that has been stable, has not used rescue inhaler in a long time    GERD: she has been doing well with Nexium daily and pepcid prn, she tries to follow a GERD appropriate diet    Neuropathy: she states she has burning sensation on both feet for many years, she is on B12 supplementation, she states symptoms have been under control . We will recheck levels next visit    Meralgia paresthetica: it was present for many years, right lateral thigh, not associated with weakness, she states pain is not as severe since she lost weight. Stable    Major Depression: she stopped taking all medication back in 2017.Marland Kitchen She stopped because of side effects - tremors with Abilfy Tried cymbalta but it made her feel "weird" and stopped medication, it made her feel anxious, however decided to go back on  Prozac 2022 she does not like the time change and gets down for about one month but states medication works well for her    Obesity: Her weight  was 261 lbs and today [place] 265 lbs. Weight is up again at 271 lbs, she has not been back to the gym    Vitamin D and Vitamin B12 deficiency: back to normal still on supplementation. We will recheck labs next visit    OA: she  has been less active and pain on both knees, currently seeing Dr. Candelaria Stagers at [facility], she had hyaluronic acid injections and helped a little initially but afraid to go back due to heart issues that started around the same time as the injections    Senile purpura: both arms, stable    Chronic low back pain:  with a flare, she states tripped and fell at home in July 22  and fell again August 22, the pain was only on lower back and aching like, but around January 23  had symptoms of sciatica, currently pain is 0/10 sitting down, she is still doing her PT at home, average pain is a 3/10    Patient Active Problem List   Diagnosis Date Noted   Essential hypertension 04/06/2021   Palpitations 04/06/2021   BMI 45.0-49.9, adult ([place]) 02/23/2020   Senile purpura ([place]) 03/14/2018   Age-related macular degeneration 03/14/2018   Morbid obesity ([place]) 03/14/2018   Peripheral polyneuropathy  03/02/2017   Vitamin B12 deficiency 07/29/2015   Carpal tunnel syndrome 07/27/2015   Osteoarthritis 07/27/2015   Depression, major, recurrent, mild ([place]) 07/27/2015   Gastro-esophageal reflux disease without esophagitis 07/27/2015   Displacement of cervical intervertebral disc without myelopathy [id number]   Dysmetabolic syndrome [id number]   Vitamin D deficiency 07/27/2015   Sprain of rotator cuff capsule 07/27/2015   Urgency of urination 07/27/2015   Allergic rhinitis 07/27/2015   Asthma, moderate persistent 07/27/2015    Past Surgical History:  Procedure Laterality Date   ABDOMINAL HYSTERECTOMY  1991   BILATERAL SALPINGOOPHORECTOMY      CARPAL TUNNEL RELEASE Bilateral [id number]   [facility]   CATARACT EXTRACTION W/PHACO Left 09/23/2015   Procedure: CATARACT EXTRACTION PHACO AND INTRAOCULAR LENS PLACEMENT ([place]);  Surgeon: Leandrew Koyanagi, MD;  Location: [facility] ORS;  Service: Ophthalmology;  Laterality: Left;  [place] 00:57 AP% 12.2 6.98 CDE Fluid pack lot # [id number]   CATARACT EXTRACTION W/PHACO Right 10/17/2015   Procedure: CATARACT EXTRACTION PHACO AND INTRAOCULAR LENS PLACEMENT (IOC);  Surgeon: Leandrew Koyanagi, MD;  Location: [facility] ORS;  Service: Ophthalmology;  Laterality: Right;  [place]  00:30.5 AP   00:47.2 CDE  6.05 casette lot [id number]   DandC     DILATION AND CURETTAGE OF UTERUS  1990   HERNIA REPAIR  1992   ventral    Family History  Problem Relation Age of Onset   Hypertension Mother    Cancer Father        prostate   CVA Father    Depression Father    Healthy Sister    Cancer Brother        prostate    Social History   Tobacco Use   Smoking status: Never   Smokeless tobacco: Never   Tobacco comments:    smoking cessation materials not required  Substance Use Topics   Alcohol use: No    Alcohol/week: 0.0 standard drinks of alcohol     Current Outpatient Medications:    acetaminophen (TYLENOL) 500 MG tablet, Take 500 mg by mouth in the morning, at noon, and at bedtime., Disp: , Rfl:    albuterol (PROAIR HFA) 108 (90 Base) MCG/ACT inhaler, INHALE TWO PUFFS EVERY 6 HOURS AS NEEDEDWHEEZING, Disp: 8.5 g, Rfl: 1   Cholecalciferol (VITAMIN D) 2000 UNITS tablet, Take 1 tablet by mouth daily. Bedtime, Disp: , Rfl:    esomeprazole (NEXIUM) 40 MG capsule, Take 1 capsule (40 mg total) by mouth daily., Disp: 90 capsule, Rfl: 1   famotidine (PEPCID) 40 MG tablet, TAKE 1 TABLET BY MOUTH EVERYDAY AT BEDTIME, Disp: 90 tablet, Rfl: 0   FLUoxetine (PROZAC) 20 MG tablet, Take 1 tablet (20 mg total) by mouth daily., Disp: 90 tablet, Rfl: 1   Fluticasone Furoate (ARNUITY ELLIPTA) 100 MCG/ACT AEPB, Inhale 1 puff into the  lungs daily., Disp: 30 each, Rfl: 5   furosemide (LASIX) 20 MG tablet, Take 1 tablet (20 mg total) by mouth daily., Disp: 90 tablet, Rfl: 3   losartan (COZAAR) 25 MG tablet, TAKE 1 TABLET (25 MG TOTAL) BY MOUTH DAILY., Disp: 90 tablet, Rfl: 3   Multiple Vitamins-Minerals (ICAPS AREDS 2) CAPS, Take 2 capsules by mouth daily. , Disp: , Rfl:    Probiotic Product (UP4 PROBIOTICS WOMENS PO), Take by mouth daily., Disp: , Rfl:    vitamin B-12 (CYANOCOBALAMIN) 1000 MCG tablet, Take 1,000 mcg by mouth daily. Bedtime, Disp: , Rfl:    diltiazem (CARDIZEM) 30 MG tablet, Take 1 tablet (30 mg total) by mouth  2 (two) times daily as needed (As needed for fast heart rates or palpitations)., Disp: 90 tablet, Rfl: 3  Allergies  Allergen Reactions   Breo Ellipta [Fluticasone Furoate-Vilanterol]     Palpitation    Duloxetine     Mental fogginess    Morphine    Penicillins    Sulfa Antibiotics     I personally reviewed active problem list, medication list, allergies, family history, social history, health maintenance with the patient/caregiver today.   ROS  Constitutional: Negative for fever or weight change.  Respiratory: Negative for cough and shortness of breath.   Cardiovascular: Negative for chest pain, very seldom  palpitations.  Gastrointestinal: Negative for abdominal pain, no bowel changes.  Musculoskeletal: positive for gait problem- uses a cane prn  no  joint swelling.  Skin: Negative for rash.  Neurological: Negative for dizziness or headache.  No other specific complaints in a complete review of systems (except as listed in HPI above).   Objective  Vitals:   02/09/23 1105  BP: 128/80  Pulse: 75  Resp: 16  Temp: 98 F (36.7 C)  TempSrc: Oral  SpO2: 97%  Weight: 271 lb 1.6 oz (123 kg)  Height: 5\' 4"  (1.626 m)    Body mass index is 46.53 kg/m.  Physical Exam  Constitutional: Patient appears well-developed and well-nourished. Obese  No distress.  HEENT: head atraumatic,  normocephalic, pupils equal and reactive to light,  neck supple Cardiovascular: Normal rate, regular rhythm and normal heart sounds.  No murmur heard. No BLE edema. Pulmonary/Chest: Effort normal and breath sounds normal. No respiratory distress. Abdominal: Soft.  There is no tenderness. Psychiatric: Patient has a normal mood and affect. behavior is normal. Judgment and thought content normal.    PHQ2/9:    02/09/2023   11:08 AM 08/11/2022   11:08 AM 02/05/2022    9:42 AM 12/22/2021   12:59 PM 08/08/2021   11:18 AM  Depression screen PHQ 2/9  Decreased Interest 2 2 2 1 1   Down, Depressed, Hopeless 0 0 1 1 1   PHQ - 2 Score 2 2 3 2 2   Altered sleeping 0 3 0 0 0  Tired, decreased energy 1 3 3  0 1  Change in appetite 0 0 0 0 0  Feeling bad or failure about yourself  0 0 0 0 1  Trouble concentrating 0 0 0 0 0  Moving slowly or fidgety/restless 0 0 0 0 0  Suicidal thoughts 0 0 0 0 0  PHQ-9 Score 3 8 6 2 4   Difficult doing work/chores Somewhat difficult        phq 9 is positive   Fall Risk:    02/09/2023   11:04 AM 08/11/2022   11:08 AM 02/05/2022    9:42 AM 12/22/2021   12:59 PM 08/08/2021   11:18 AM  Fall Risk   Falls in the past year? 1 0 0 1 1  Number falls in past yr: 0 0 0 1 1  Injury with Fall? 1 0 0 0 1  Risk for fall due to : History of fall(s) No Fall Risks Impaired balance/gait Impaired balance/gait Impaired mobility  Follow up Falls prevention discussed;Education provided;Falls evaluation completed Falls prevention discussed Falls prevention discussed Falls prevention discussed Falls prevention discussed      Functional Status Survey: Is the patient deaf or have difficulty hearing?: No Does the patient have difficulty seeing, even when wearing glasses/contacts?: No Does the patient have difficulty concentrating, remembering, or making decisions?: No Does the  patient have difficulty walking or climbing stairs?: Yes Does the patient have difficulty dressing or bathing?:  No Does the patient have difficulty doing errands alone such as visiting a doctor's office or shopping?: No    Assessment & Plan  1. Senile purpura ([place])  Reassurance given   2. Morbid obesity ([place])  Discussed with the patient the risk posed by an increased BMI. Discussed importance of portion control, calorie counting and at least 150 minutes of physical activity weekly. Avoid sweet beverages and drink more water. Eat at least 6 servings of fruit and vegetables daily    3. Depression, major, recurrent, mild (HCC)  - FLUoxetine (PROZAC) 20 MG tablet; Take 1 tablet (20 mg total) by mouth daily.  Dispense: 90 tablet; Refill: 1  4. Asthma, moderate persistent, well-controlled  - Fluticasone Furoate (ARNUITY ELLIPTA) 100 MCG/ACT AEPB; Inhale 1 puff into the lungs daily.  Dispense: 30 each; Refill: 5  5. Vitamin D deficiency  Recheck next visit   6. Hyperglycemia  Recheck next visit   7. Primary osteoarthritis involving multiple joints  Stable, takes  medication daily   8. Gastro-esophageal reflux disease without esophagitis  - esomeprazole (NEXIUM) 40 MG capsule; Take 1 capsule (40 mg total) by mouth daily.  Dispense: 90 capsule; Refill: 1  9. Vitamin B12 deficiency  Recheck next visit   10. Spinal stenosis of lumbosacral region  No longer having radiculitis   11. Chronic bilateral low back pain without sciatica

## 2023-02-09 ENCOUNTER — Encounter: Payer: Self-pay | Admitting: Family Medicine

## 2023-02-09 ENCOUNTER — Ambulatory Visit: Payer: Medicare PPO | Admitting: Family Medicine

## 2023-02-09 VITALS — BP 128/80 | HR 75 | Temp 98.0°F | Resp 16 | Ht 64.0 in | Wt 271.1 lb

## 2023-02-09 DIAGNOSIS — G8929 Other chronic pain: Secondary | ICD-10-CM

## 2023-02-09 DIAGNOSIS — F330 Major depressive disorder, recurrent, mild: Secondary | ICD-10-CM | POA: Diagnosis not present

## 2023-02-09 DIAGNOSIS — E559 Vitamin D deficiency, unspecified: Secondary | ICD-10-CM

## 2023-02-09 DIAGNOSIS — E538 Deficiency of other specified B group vitamins: Secondary | ICD-10-CM | POA: Diagnosis not present

## 2023-02-09 DIAGNOSIS — Z23 Encounter for immunization: Secondary | ICD-10-CM | POA: Diagnosis not present

## 2023-02-09 DIAGNOSIS — J4540 Moderate persistent asthma, uncomplicated: Secondary | ICD-10-CM | POA: Diagnosis not present

## 2023-02-09 DIAGNOSIS — R739 Hyperglycemia, unspecified: Secondary | ICD-10-CM | POA: Diagnosis not present

## 2023-02-09 DIAGNOSIS — M159 Polyosteoarthritis, unspecified: Secondary | ICD-10-CM

## 2023-02-09 DIAGNOSIS — K219 Gastro-esophageal reflux disease without esophagitis: Secondary | ICD-10-CM

## 2023-02-09 DIAGNOSIS — D692 Other nonthrombocytopenic purpura: Secondary | ICD-10-CM | POA: Diagnosis not present

## 2023-02-09 DIAGNOSIS — J45909 Unspecified asthma, uncomplicated: Secondary | ICD-10-CM | POA: Insufficient documentation

## 2023-02-09 DIAGNOSIS — M4807 Spinal stenosis, lumbosacral region: Secondary | ICD-10-CM

## 2023-02-09 DIAGNOSIS — M545 Low back pain, unspecified: Secondary | ICD-10-CM

## 2023-02-09 MED ORDER — FLUOXETINE HCL 20 MG PO TABS: 20.0000 mg | ORAL_TABLET | Freq: Every day | ORAL | 1 refills | Status: DC

## 2023-02-09 MED ORDER — ARNUITY ELLIPTA 100 MCG/ACT IN AEPB: 1.0000 | INHALATION_SPRAY | Freq: Every day | RESPIRATORY_TRACT | 5 refills | Status: DC

## 2023-02-09 MED ORDER — ESOMEPRAZOLE MAGNESIUM 40 MG PO CPDR: 40.0000 mg | DELAYED_RELEASE_CAPSULE | Freq: Every day | ORAL | 1 refills | Status: DC

## 2023-02-09 NOTE — Patient Instructions (Signed)
Get Tdap and RSV at local pharmacy

## 2023-03-26 ENCOUNTER — Other Ambulatory Visit: Payer: Self-pay | Admitting: Physician Assistant

## 2023-06-02 ENCOUNTER — Other Ambulatory Visit: Payer: Self-pay | Admitting: Physician Assistant

## 2023-08-04 NOTE — Progress Notes (Unsigned)
Name: Christie Hanson   MRN: [id number]    DOB: November 18, 1945   Date:08/05/2023       Progress Note  Subjective  Chief Complaint  Follow Up  HPI  Metabolic Syndrome: last hgbA1C was 5.5%, she denies polyphagia, polydipsia or polyuria. We will recheck labs   Hypertension Benign: she could not tolerate Bystolic, she is now on Losartan, and lasix , she has Cardizem to take prn palpitation, hyponatremia resolved, bp is at goal today. No chest pain or palpitation . We will recheck labs today   Zio patch 03/2021: The patient was monitored for 14 days. The predominant rhythm was sinus with an average rate of 55 bpm (range 43-96 bpm and sinus). There were occasional PACs (3.9% burden) and rare PVCs. 4 atrial runs lasting up to 7 beats with a maximum rate of 148 bpm were observed. No sustained arrhythmia or prolonged pause was seen. Patient triggered events correspond to sinus rhythm, PACs, and PVCs.   Predominantly sinus rhythm with occasional PACs and rare PVCs.  A few brief episodes of PSVT also noted.    Asthma Moderate: She is now on Arnuity only and her  symptoms are controlled, no wheezing, she has chronic SOB and likely multifactorial that has been stable, has not used rescue inhaler in a long time    GERD: she has been doing well with Nexium daily and pepcid prn at night , she tries to follow a GERD appropriate diet   Urinary incontinence: symptoms going on for years but getting worse, having to wear pads and change multiple times a day. She has urgency and cannot move fast due to knee OA    Neuropathy: she states she has burning sensation on both feet for many years, she is on B12 supplementation, she states symptoms have been under control . We will recheck labs    Major Depression: she stopped taking all medication back in 2017. She stopped because of side effects - tremors with Abilfy Tried cymbalta but it made her feel "weird" and stopped medication, it made her feel anxious,  however decided to go back on Prozac 2022. She is grieving now, husband died from complications of stroke 05/2023.    Obesity: Her weight is down from 273 lbs to 265.4 lbs , she states eating healthy microwave food, she is eating to keep her glucose up, no appetite since husband died.    Vitamin D and Vitamin B12 deficiency: back to normal still on supplementation. We will recheck labs today    OA: she  has been less active and pain on both knees, currently seeing Dr. Landry Mellow at [facility], she had hyaluronic acid injections and helped a little initially but afraid to go back due to heart issues that started around the same time as the injections    Senile purpura: both arms, stable  . Reassurance given   Chronic low back pain:  January 23  had symptoms of sciatica, currently pain is 0/10 sitting down, she is still doing her PT at home, average pain is a 3/10 and stable    Patient Active Problem List   Diagnosis Date Noted   Asthma, well controlled 02/09/2023   Essential hypertension 04/06/2021   Palpitations 04/06/2021   BMI 45.0-49.9, adult (HCC) 02/23/2020   Senile purpura (HCC) 03/14/2018   Age-related macular degeneration 03/14/2018   Morbid obesity (HCC) 03/14/2018   Peripheral polyneuropathy 03/02/2017   Vitamin B12 deficiency 07/29/2015   Carpal tunnel syndrome 07/27/2015   Osteoarthritis 07/27/2015  Depression, major, recurrent, mild (HCC) 07/27/2015   Gastro-esophageal reflux disease without esophagitis 07/27/2015   Displacement of cervical intervertebral disc without myelopathy 07/27/2015   Hyperglycemia 07/27/2015   Dysmetabolic syndrome 07/27/2015   Vitamin D deficiency 07/27/2015   Sprain of rotator cuff capsule 07/27/2015   Urgency of urination 07/27/2015   Allergic rhinitis 07/27/2015   Asthma, moderate persistent 07/27/2015    Past Surgical History:  Procedure Laterality Date   ABDOMINAL HYSTERECTOMY  1991   BILATERAL SALPINGOOPHORECTOMY     CARPAL  TUNNEL RELEASE Bilateral [id number]   [facility]   CATARACT EXTRACTION W/PHACO Left 09/23/2015   Procedure: CATARACT EXTRACTION PHACO AND INTRAOCULAR LENS PLACEMENT (IOC);  Surgeon: Lockie Mola, MD;  Location: [facility] ORS;  Service: Ophthalmology;  Laterality: Left;  [place] 00:57 AP% 12.2 6.98 CDE Fluid pack lot # [id number]   CATARACT EXTRACTION W/PHACO Right 10/17/2015   Procedure: CATARACT EXTRACTION PHACO AND INTRAOCULAR LENS PLACEMENT (IOC);  Surgeon: Lockie Mola, MD;  Location: [facility] ORS;  Service: Ophthalmology;  Laterality: Right;  [place]  00:30.5 AP   00:47.2 CDE  6.05 casette lot #[id number]   DandC     DILATION AND CURETTAGE OF UTERUS  1990   HERNIA REPAIR  1992   ventral    Family History  Problem Relation Age of Onset   Hypertension Mother    Cancer Father        prostate   CVA Father    Depression Father    Healthy Sister    Cancer Brother        prostate    Social History   Tobacco Use   Smoking status: Never   Smokeless tobacco: Never   Tobacco comments:    smoking cessation materials not required  Substance Use Topics   Alcohol use: No    Alcohol/week: 0.0 standard drinks of alcohol     Current Outpatient Medications:    acetaminophen (TYLENOL) 500 MG tablet, Take 500 mg by mouth in the morning, at noon, and at bedtime., Disp: , Rfl:    albuterol (PROAIR HFA) 108 (90 Base) MCG/ACT inhaler, INHALE TWO PUFFS EVERY 6 HOURS AS NEEDEDWHEEZING, Disp: 8.5 g, Rfl: 1   Cholecalciferol (VITAMIN D) 2000 UNITS tablet, Take 1 tablet by mouth daily. Bedtime, Disp: , Rfl:    esomeprazole (NEXIUM) 40 MG capsule, Take 1 capsule (40 mg total) by mouth daily., Disp: 90 capsule, Rfl: 1   famotidine (PEPCID) 40 MG tablet, TAKE 1 TABLET BY MOUTH EVERYDAY AT BEDTIME, Disp: 90 tablet, Rfl: 0   FLUoxetine (PROZAC) 20 MG tablet, Take 1 tablet (20 mg total) by mouth daily., Disp: 90 tablet, Rfl: 1   Fluticasone Furoate (ARNUITY ELLIPTA) 100 MCG/ACT AEPB, Inhale 1 puff into the lungs  daily., Disp: 30 each, Rfl: 5   furosemide (LASIX) 20 MG tablet, TAKE 1 TABLET BY MOUTH EVERY DAY, Disp: 90 tablet, Rfl: 2   losartan (COZAAR) 25 MG tablet, TAKE 1 TABLET (25 MG TOTAL) BY MOUTH DAILY., Disp: 90 tablet, Rfl: 0   Multiple Vitamins-Minerals (ICAPS AREDS 2) CAPS, Take 2 capsules by mouth daily. , Disp: , Rfl:    Probiotic Product (UP4 PROBIOTICS WOMENS PO), Take by mouth daily., Disp: , Rfl:    vitamin B-12 (CYANOCOBALAMIN) 1000 MCG tablet, Take 1,000 mcg by mouth daily. Bedtime, Disp: , Rfl:    diltiazem (CARDIZEM) 30 MG tablet, Take 1 tablet (30 mg total) by mouth 2 (two) times daily as needed (As needed for fast heart rates or palpitations)., Disp: 90 tablet,  Rfl: 3  Allergies  Allergen Reactions   Breo Ellipta [Fluticasone Furoate-Vilanterol]     Palpitation    Duloxetine     Mental fogginess    Morphine    Penicillins    Sulfa Antibiotics     I personally reviewed active problem list, medication list, allergies, family history, social history, health maintenance with the patient/caregiver today.   ROS  Ten systems reviewed and is negative except as mentioned in HPI    Objective  Vitals:   08/05/23 1036  BP: 126/78  Pulse: 60  Resp: 16  Temp: 97.7 F (36.5 C)  TempSrc: Oral  SpO2: 96%  Weight: 265 lb 6.4 oz (120.4 kg)  Height: 5\' 4"  (1.626 m)    Body mass index is 45.56 kg/m.  Physical Exam  Constitutional: Patient appears well-developed and well-nourished. Obese  No distress.  HEENT: head atraumatic, normocephalic, pupils equal and reactive to light, neck supple Cardiovascular: Normal rate, regular rhythm and normal heart sounds.  No murmur heard. No BLE edema. Pulmonary/Chest: Effort normal and breath sounds normal. No respiratory distress. Abdominal: Soft.  There is no tenderness. Muscular skeletal: crepitus on both knees  Psychiatric: Patient has a normal mood and affect. behavior is normal. Judgment and thought content normal.     PHQ2/9:    08/05/2023   10:56 AM 02/09/2023   11:08 AM 08/11/2022   11:08 AM 02/05/2022    9:42 AM 12/22/2021   12:59 PM  Depression screen PHQ 2/9  Decreased Interest 2 2 2 2 1   Down, Depressed, Hopeless 3 0 0 1 1  PHQ - 2 Score 5 2 2 3 2   Altered sleeping 0 0 3 0 0  Tired, decreased energy 2 1 3 3  0  Change in appetite 0 0 0 0 0  Feeling bad or failure about yourself  0 0 0 0 0  Trouble concentrating 0 0 0 0 0  Moving slowly or fidgety/restless 0 0 0 0 0  Suicidal thoughts 0 0 0 0 0  PHQ-9 Score 7 3 8 6 2   Difficult doing work/chores Somewhat difficult Somewhat difficult       phq 9 is positive   Fall Risk:    08/05/2023   10:56 AM 02/09/2023   11:04 AM 08/11/2022   11:08 AM 02/05/2022    9:42 AM 12/22/2021   12:59 PM  Fall Risk   Falls in the past year? 1 1 0 0 1  Number falls in past yr: 0 0 0 0 1  Injury with Fall? 1 1 0 0 0  Risk for fall due to : History of fall(s) History of fall(s) No Fall Risks Impaired balance/gait Impaired balance/gait  Follow up Falls prevention discussed;Education provided;Falls evaluation completed Falls prevention discussed;Education provided;Falls evaluation completed Falls prevention discussed Falls prevention discussed Falls prevention discussed      Functional Status Survey: Is the patient deaf or have difficulty hearing?: No Does the patient have difficulty seeing, even when wearing glasses/contacts?: No Does the patient have difficulty concentrating, remembering, or making decisions?: Yes Does the patient have difficulty walking or climbing stairs?: Yes Does the patient have difficulty dressing or bathing?: No Does the patient have difficulty doing errands alone such as visiting a doctor's office or shopping?: No    Assessment & Plan  1. Chronic recurrent major depressive disorder (HCC)  - FLUoxetine (PROZAC) 20 MG tablet; Take 1 tablet (20 mg total) by mouth daily.  Dispense: 90 tablet; Refill: 1  2. Senile purpura  (  HCC)  Reassurance given   3. Morbid obesity (HCC)  Discussed with the patient the risk posed by an increased BMI. Discussed importance of portion control, calorie counting and at least 150 minutes of physical activity weekly. Avoid sweet beverages and drink more water. Eat at least 6 servings of fruit and vegetables daily    4. Gastro-esophageal reflux disease without esophagitis  - esomeprazole (NEXIUM) 40 MG capsule; Take 1 capsule (40 mg total) by mouth daily.  Dispense: 90 capsule; Refill: 1  5. Vitamin B12 deficiency  - B12 and Folate Panel  6. Vitamin D deficiency  - VITAMIN D 25 Hydroxy (Vit-D Deficiency, Fractures)  7. Asthma, moderate persistent, well-controlled  - Fluticasone Furoate (ARNUITY ELLIPTA) 100 MCG/ACT AEPB; Inhale 1 puff into the lungs daily.  Dispense: 30 each; Refill: 5  8. Benign hypertension  - CBC with Differential/Platelet - COMPLETE METABOLIC PANEL WITH GFR  9. Need for immunization against influenza  - Flu Vaccine Trivalent High Dose (Fluad)  10. Chronic bilateral low back pain with left-sided sciatica  Stable  11. Hyperglycemia  - Hemoglobin A1c  12. Urge incontinence of urine  - tolterodine (DETROL LA) 4 MG 24 hr capsule; Take 1 capsule (4 mg total) by mouth daily.  Dispense: 30 capsule; Refill: 0

## 2023-08-05 ENCOUNTER — Encounter: Payer: Self-pay | Admitting: Family Medicine

## 2023-08-05 ENCOUNTER — Ambulatory Visit: Payer: Medicare PPO | Admitting: Family Medicine

## 2023-08-05 VITALS — BP 126/78 | HR 60 | Temp 97.7°F | Resp 16 | Ht 64.0 in | Wt 265.4 lb

## 2023-08-05 DIAGNOSIS — D692 Other nonthrombocytopenic purpura: Secondary | ICD-10-CM

## 2023-08-05 DIAGNOSIS — G8929 Other chronic pain: Secondary | ICD-10-CM

## 2023-08-05 DIAGNOSIS — F339 Major depressive disorder, recurrent, unspecified: Secondary | ICD-10-CM | POA: Diagnosis not present

## 2023-08-05 DIAGNOSIS — M5442 Lumbago with sciatica, left side: Secondary | ICD-10-CM

## 2023-08-05 DIAGNOSIS — E559 Vitamin D deficiency, unspecified: Secondary | ICD-10-CM

## 2023-08-05 DIAGNOSIS — I10 Essential (primary) hypertension: Secondary | ICD-10-CM

## 2023-08-05 DIAGNOSIS — K219 Gastro-esophageal reflux disease without esophagitis: Secondary | ICD-10-CM

## 2023-08-05 DIAGNOSIS — R739 Hyperglycemia, unspecified: Secondary | ICD-10-CM | POA: Diagnosis not present

## 2023-08-05 DIAGNOSIS — J4540 Moderate persistent asthma, uncomplicated: Secondary | ICD-10-CM | POA: Diagnosis not present

## 2023-08-05 DIAGNOSIS — Z23 Encounter for immunization: Secondary | ICD-10-CM

## 2023-08-05 DIAGNOSIS — E538 Deficiency of other specified B group vitamins: Secondary | ICD-10-CM

## 2023-08-05 DIAGNOSIS — N3941 Urge incontinence: Secondary | ICD-10-CM

## 2023-08-05 MED ORDER — TOLTERODINE TARTRATE ER 4 MG PO CP24
4.0000 mg | ORAL_CAPSULE | Freq: Every day | ORAL | 0 refills | Status: DC
Start: 2023-08-05 — End: 2023-08-30

## 2023-08-05 MED ORDER — ESOMEPRAZOLE MAGNESIUM 40 MG PO CPDR
40.0000 mg | DELAYED_RELEASE_CAPSULE | Freq: Every day | ORAL | 1 refills | Status: DC
Start: 2023-08-05 — End: 2024-02-04

## 2023-08-05 MED ORDER — ARNUITY ELLIPTA 100 MCG/ACT IN AEPB: 1.0000 | INHALATION_SPRAY | Freq: Every day | RESPIRATORY_TRACT | 5 refills | Status: DC

## 2023-08-05 MED ORDER — FLUOXETINE HCL 20 MG PO TABS
20.0000 mg | ORAL_TABLET | Freq: Every day | ORAL | 1 refills | Status: DC
Start: 2023-08-05 — End: 2024-01-27

## 2023-08-06 ENCOUNTER — Ambulatory Visit: Payer: Medicare PPO

## 2023-08-06 ENCOUNTER — Ambulatory Visit: Payer: Medicare PPO | Admitting: Family Medicine

## 2023-08-06 LAB — COMPLETE METABOLIC PANEL WITH GFR
AG Ratio: 1.6 (calc) (ref 1.0–2.5)
ALT: 9 U/L (ref 6–29)
AST: 14 U/L (ref 10–35)
Albumin: 4.5 g/dL (ref 3.6–5.1)
Alkaline phosphatase (APISO): 76 U/L (ref 37–153)
BUN: 16 mg/dL (ref 7–25)
CO2: 29 mmol/L (ref 20–32)
Calcium: 9.4 mg/dL (ref 8.6–10.4)
Chloride: 99 mmol/L (ref 98–110)
Creat: 0.92 mg/dL (ref 0.60–1.00)
Globulin: 2.8 g/dL (calc) (ref 1.9–3.7)
Glucose, Bld: 93 mg/dL (ref 65–99)
Potassium: 5 mmol/L (ref 3.5–5.3)
Sodium: 137 mmol/L (ref 135–146)
Total Bilirubin: 0.6 mg/dL (ref 0.2–1.2)
Total Protein: 7.3 g/dL (ref 6.1–8.1)
eGFR: 64 mL/min/{1.73_m2} (ref >=60)

## 2023-08-06 LAB — CBC WITH DIFFERENTIAL/PLATELET
Absolute Monocytes: 507 cells/uL (ref 200–950)
Basophils Absolute: 39 cells/uL (ref 0–200)
Basophils Relative: 0.5 %
Eosinophils Absolute: 101 cells/uL (ref 15–500)
Eosinophils Relative: 1.3 %
HCT: 43.2 % (ref 35.0–45.0)
Hemoglobin: 14.2 g/dL (ref 11.7–15.5)
Lymphs Abs: 2254 cells/uL (ref 850–3900)
MCH: 30.7 pg (ref 27.0–33.0)
MCHC: 32.9 g/dL (ref 32.0–36.0)
MCV: 93.5 fL (ref 80.0–100.0)
MPV: 10.8 fL (ref 7.5–12.5)
Monocytes Relative: 6.5 %
Neutro Abs: 4898 cells/uL (ref 1500–7800)
Neutrophils Relative %: 62.8 %
Platelets: 269 10*3/uL (ref 140–400)
RBC: 4.62 10*6/uL (ref 3.80–5.10)
RDW: 13.2 % (ref 11.0–15.0)
Total Lymphocyte: 28.9 %
WBC: 7.8 10*3/uL (ref 3.8–10.8)

## 2023-08-06 LAB — VITAMIN D 25 HYDROXY (VIT D DEFICIENCY, FRACTURES): Vit D, 25-Hydroxy: 32 ng/mL (ref 30–100)

## 2023-08-06 LAB — HEMOGLOBIN A1C
Hgb A1c MFr Bld: 5.7 % of total Hgb — ABNORMAL HIGH (ref <5.7)
Mean Plasma Glucose: 117 mg/dL
eAG (mmol/L): 6.5 mmol/L

## 2023-08-06 LAB — B12 AND FOLATE PANEL
Folate: 8.3 ng/mL
Vitamin B-12: 791 pg/mL (ref 200–1100)

## 2023-08-24 DIAGNOSIS — H353132 Nonexudative age-related macular degeneration, bilateral, intermediate dry stage: Secondary | ICD-10-CM | POA: Diagnosis not present

## 2023-08-24 DIAGNOSIS — H43813 Vitreous degeneration, bilateral: Secondary | ICD-10-CM | POA: Diagnosis not present

## 2023-08-24 DIAGNOSIS — Z961 Presence of intraocular lens: Secondary | ICD-10-CM | POA: Diagnosis not present

## 2023-08-28 ENCOUNTER — Other Ambulatory Visit: Payer: Self-pay | Admitting: Physician Assistant

## 2023-08-28 ENCOUNTER — Other Ambulatory Visit: Payer: Self-pay | Admitting: Family Medicine

## 2023-08-28 DIAGNOSIS — N3941 Urge incontinence: Secondary | ICD-10-CM

## 2023-09-27 ENCOUNTER — Other Ambulatory Visit: Payer: Self-pay | Admitting: Internal Medicine

## 2023-09-27 NOTE — Telephone Encounter (Signed)
Please contact pt for future appointment. Pt overdue for follow up.

## 2023-09-29 NOTE — Telephone Encounter (Signed)
Last office visit: 09/14/22 with plan to f/u in 12 months  Next office visit: 10/20/23

## 2023-10-19 NOTE — Progress Notes (Unsigned)
Cardiology Office Note    Date:  10/20/2023   ID:  Christie Hanson, DOB 1946/10/08, MRN [id number]  PCP:  Alba Cory, MD  Cardiologist:  Yvonne Kendall, MD  Electrophysiologist:  None   Chief Complaint: Follow up  History of Present Illness:   Christie Hanson is a 77 y.o. female with history of HTN, atrial tachycardia, PACs, hyperglycemia, asthma, vertigo, GERD, depression, and obesity who presents for follow up of HTN.   She was evaluated as a new patient by Dr. Okey Dupre on 04/04/2021 at the request of her PCP for hypertension. At that visit, she noted elevated BP was just recently diagnosed when she presented to her orthopedist for joint injections, though did report, in hindsight, her BPs had been elevated for several months at home. She had been seen in the ED in 02/2021 with elevated BP of > 200 mmHg at home with reassuring work up and recommendation for her to follow up with her PCP. She also noted sporadic palpitations with her heart rate increasing up to 140 bpm, with episodes usually lasting 15 seconds in duration, with 1 episode lasting approximately 40 minutes before spontaneous resolution. She noted, in the past, her palpitations were associated with excess caffeine intake, though this had not been the case with those more recent episodes. With regards to her BP, she had been started on HCTZ and Bystolic by her PCP in early 03/2021, with subsequent development of fatigue and heart rates in the 50s bpm at times. At her visit with Dr. Okey Dupre, her BP was reasonably controlled at 136/70 with a heart rate of 59 bpm. Her Bystolic was decreased to 5 mg and sodium restriction was advised. Subsequent outpatient cardiac monitoring showed a predominant rhythm of sinus with an average heart rate of 55 bpm (range 43-96 bpm in sinus), occasional PACs representing a 3.9% burden, rare PVCs, 4 atrial runs lasting up to 7 beats with a maximum rate of 148 bpm, and no sustained arrhythmias or prolonged  pauses noted. Patient triggered events corresponded to sinus rhythm, PACs, and PVCs.    She was seen in follow-up on 05/20/2021, and continued to note significant fatigue and somnolence since initiating Bystolic.  She reported prior to initiating Bystolic, her activity level was much better.  Home BPs were in the 1 teens to 130s over 60s to 70s.  Her palpitations remained largely unchanged despite cessation of caffeine.  Given continued fatigue, Bystolic was discontinued.  She was placed on Cardizem LA 120 mg daily.   She was seen in the office in 06/2021 and noted improvement in her underlying fatigue following discontinuation of beta-blocker.  She did feel like Cardizem had helped with her palpitations, however since initiating this medication she noted an increase in nightmares and difficulty falling asleep with noted mind racing.  She also noted some lower extremity swelling following the initiation of Cardizem.  There was an increase in her vertigo.  BP at home had been well controlled in the 130s to 140s mmHg systolic with heart rates in the 60s to 70s bpm off beta-blocker.  She noted xerostomia with chlorthalidone.  Given nightmares, Cardizem was discontinued with recommendation for watchful waiting regarding palpitations and recommendation for her to take as needed short acting diltiazem for sustained tachypalpitations.  With noted xerostomia, chlorthalidone was discontinued and she was started on losartan 25 mg.   She was seen in the office on 07/31/2021 and was doing well from a cardiac perspective.  She was under increased stress  at home with the health of several family members.  With this, she noted an uptrend in her BP into the 140s to 150s systolic.  She had not needed any as needed diltiazem.  She was tolerating losartan without issues.  With the discontinuation of chlorthalidone on a daily basis she noted resolution of xerostomia.  Her weight was up approximately 4 pounds since her prior visit.   She was started back on chlorthalidone 12.5 mg every other day.  She was seen in the office on 09/08/2021 and continued to do well from a cardiac perspective.  She felt like she was back to her prior baseline.  Home BPs were reasonably controlled.  She had not needed any as needed diltiazem.  She was last seen in the office in 08/2022 and remained without symptoms of angina or cardiac decompensation.  Blood pressure was well-controlled at home typically in the 130s/80s.  She only noted intermittent palpitations if she was fatigued following a busy day.  No changes were indicated at that time.  She comes in doing well from a cardiac perspective and is without symptoms of angina or cardiac decompensation.  No palpitations, dizziness, presyncope, or syncope.  Since she was last seen she has suffered the loss of her husband.  Her sister is now currently living with her following her sister suffering a mechanical fall.  She has not needed any as needed diltiazem.  No progressive orthopnea.  She does not have any acute cardiac concerns at this time.   Labs independently reviewed: 07/2023 - A1c 5.7, BUN 16, serum creatinine 0.92, potassium 5.0, Hgb 14.2, PLT 269 06/2021 - TSH normal, magnesium 2.2 07/2020 - albumin 4.3, AST/ALT normal 01/2016 - TC 156, TG 86, HDL 45, LDL 94  Past Medical History:  Diagnosis Date   Allergy    Asthma    wheezing   Blurred vision    Candidiasis of mouth    Depression    hx of   GERD (gastroesophageal reflux disease)    Herniated disc, cervical    History of carpal tunnel release    Hyperglycemia    Metabolic syndrome    Obesity    Osteoarthritis    Ovarian deficiency    Vitamin D deficiency     Past Surgical History:  Procedure Laterality Date   ABDOMINAL HYSTERECTOMY  1991   BILATERAL SALPINGOOPHORECTOMY     CARPAL TUNNEL RELEASE Bilateral [id number]   [facility]   CATARACT EXTRACTION W/PHACO Left 09/23/2015   Procedure: CATARACT EXTRACTION PHACO AND INTRAOCULAR  LENS PLACEMENT (IOC);  Surgeon: Lockie Mola, MD;  Location: [facility] ORS;  Service: Ophthalmology;  Laterality: Left;  [place] 00:57 AP% 12.2 6.98 CDE Fluid pack lot # [id number]   CATARACT EXTRACTION W/PHACO Right 10/17/2015   Procedure: CATARACT EXTRACTION PHACO AND INTRAOCULAR LENS PLACEMENT (IOC);  Surgeon: Lockie Mola, MD;  Location: [facility] ORS;  Service: Ophthalmology;  Laterality: Right;  [place]  00:30.5 AP   00:47.2 CDE  6.05 casette lot #[id number]   DandC     DILATION AND CURETTAGE OF UTERUS  1990   HERNIA REPAIR  1992   ventral    Current Medications: Current Meds  Medication Sig   acetaminophen (TYLENOL) 500 MG tablet Take 500 mg by mouth in the morning, at noon, and at bedtime.   albuterol (PROAIR HFA) 108 (90 Base) MCG/ACT inhaler INHALE TWO PUFFS EVERY 6 HOURS AS NEEDEDWHEEZING   Cholecalciferol (VITAMIN D) 2000 UNITS tablet Take 1 tablet by mouth daily. Bedtime   diltiazem (  CARDIZEM) 30 MG tablet Take 1 tablet (30 mg total) by mouth 2 (two) times daily as needed (As needed for fast heart rates or palpitations).   esomeprazole (NEXIUM) 40 MG capsule Take 1 capsule (40 mg total) by mouth daily.   famotidine (PEPCID) 40 MG tablet TAKE 1 TABLET BY MOUTH EVERYDAY AT BEDTIME   FLUoxetine (PROZAC) 20 MG tablet Take 1 tablet (20 mg total) by mouth daily.   Fluticasone Furoate (ARNUITY ELLIPTA) 100 MCG/ACT AEPB Inhale 1 puff into the lungs daily.   furosemide (LASIX) 20 MG tablet TAKE 1 TABLET BY MOUTH EVERY DAY   losartan (COZAAR) 25 MG tablet TAKE 1 TABLET (25 MG TOTAL) BY MOUTH DAILY. DUE FOR YEARLY FOLLOW UP. PLEASE CALL OFFICE TO SCHEDULE APPOINTMENT PRIOR TO NEXT REFILL (FIRST ATTEMPT)   Multiple Vitamins-Minerals (ICAPS AREDS 2) CAPS Take 2 capsules by mouth daily.    Probiotic Product (UP4 PROBIOTICS WOMENS PO) Take by mouth daily.   vitamin B-12 (CYANOCOBALAMIN) 1000 MCG tablet Take 1,000 mcg by mouth daily. Bedtime    Allergies:   Breo ellipta [fluticasone  furoate-vilanterol], Duloxetine, Morphine, Penicillins, and Sulfa antibiotics   Social History   Socioeconomic History   Marital status: Widowed    Spouse name: Maisie Fus   Number of children: 4   Years of education: Not on file   Highest education level: Associate degree: occupational, Scientist, product/process development, or vocational program  Occupational History   Occupation: Retired  Tobacco Use   Smoking status: Never   Smokeless tobacco: Never   Tobacco comments:    smoking cessation materials not required  Vaping Use   Vaping status: Never Used  Substance and Sexual Activity   Alcohol use: No    Alcohol/week: 0.0 standard drinks of alcohol   Drug use: No   Sexual activity: Yes    Partners: Male  Other Topics Concern   Not on file  Social History Narrative   Not on file   Social Determinants of Health   Financial Resource Strain: Low Risk  (02/05/2023)   Overall Financial Resource Strain (CARDIA)    Difficulty of Paying Living Expenses: Not hard at all  Food Insecurity: No Food Insecurity (02/05/2023)   Hunger Vital Sign    Worried About Running Out of Food in the Last Year: Never true    Ran Out of Food in the Last Year: Never true  Transportation Needs: No Transportation Needs (02/05/2023)   PRAPARE - Administrator, Civil Service (Medical): No    Lack of Transportation (Non-Medical): No  Physical Activity: Unknown (02/05/2023)   Exercise Vital Sign    Days of Exercise per Week: 0 days    Minutes of Exercise per Session: Not on file  Stress: Stress Concern Present (02/05/2023)   [employer] of Occupational Health - Occupational Stress Questionnaire    Feeling of Stress : To some extent  Social Connections: Socially Integrated (02/05/2023)   Social Connection and Isolation Panel [NHANES]    Frequency of Communication with Friends and Family: More than three times a week    Frequency of Social Gatherings with Friends and Family: Once a week    Attends Religious Services: 1  to 4 times per year    Active Member of [employer] or Organizations: Yes    Attends Engineer, structural: Not on file    Marital Status: Married     Family History:  The patient's family history includes CVA in her father; Cancer in her brother and father; Depression  in her father; Healthy in her sister; Hypertension in her mother.  ROS:   12-point review of systems is negative unless otherwise noted in the HPI.   EKGs/Labs/Other Studies Reviewed:    Studies reviewed were summarized above. The additional studies were reviewed today:  Zio patch 03/2021: The patient was monitored for 14 days. The predominant rhythm was sinus with an average rate of 55 bpm (range 43-96 bpm and sinus). There were occasional PACs (3.9% burden) and rare PVCs. 4 atrial runs lasting up to 7 beats with a maximum rate of 148 bpm were observed. No sustained arrhythmia or prolonged pause was seen. Patient triggered events correspond to sinus rhythm, PACs, and PVCs.   Predominantly sinus rhythm with occasional PACs and rare PVCs.  A few brief episodes of PSVT also noted.   EKG:  EKG is ordered today.  The EKG ordered today demonstrates sinus rhythm with sinus arrhythmia, 73 bpm, minimal voltage criteria for LVH, no acute ST-T changes  Recent Labs: 08/05/2023: ALT 9; BUN 16; Creat 0.92; Hemoglobin 14.2; Platelets 269; Potassium 5.0; Sodium 137  Recent Lipid Panel    Component Value Date/Time   CHOL 156 01/24/2016 0920   TRIG 86 01/24/2016 0920   HDL 45 01/24/2016 0920   CHOLHDL 3.5 01/24/2016 0920   LDLCALC 94 01/24/2016 0920    PHYSICAL EXAM:    VS:  BP 130/88 (BP Location: Left Arm, Patient Position: Sitting, Cuff Size: Large)   Pulse 73   Ht 5\' 4"  (1.626 m)   Wt 258 lb 8 oz (117.3 kg)   SpO2 97%   BMI 44.37 kg/m   BMI: Body mass index is 44.37 kg/m.  Physical Exam Vitals reviewed.  Constitutional:      Appearance: She is well-developed.  HENT:     Head: Normocephalic and atraumatic.   Eyes:     General:        Right eye: No discharge.        Left eye: No discharge.  Cardiovascular:     Rate and Rhythm: Normal rate and regular rhythm.     Heart sounds: Normal heart sounds, S1 normal and S2 normal. Heart sounds not distant. No midsystolic click and no opening snap. No murmur heard.    No friction rub.  Pulmonary:     Effort: Pulmonary effort is normal. No respiratory distress.     Breath sounds: Normal breath sounds. No decreased breath sounds, wheezing, rhonchi or rales.  Chest:     Chest wall: No tenderness.  Abdominal:     General: There is no distension.  Musculoskeletal:     Cervical back: Normal range of motion.  Skin:    General: Skin is warm and dry.     Nails: There is no clubbing.  Neurological:     Mental Status: She is alert and oriented to person, place, and time.  Psychiatric:        Speech: Speech normal.        Behavior: Behavior normal.        Thought Content: Thought content normal.        Judgment: Judgment normal.     Wt Readings from Last 3 Encounters:  10/20/23 258 lb 8 oz (117.3 kg)  08/05/23 265 lb 6.4 oz (120.4 kg)  02/09/23 271 lb 1.6 oz (123 kg)     ASSESSMENT & PLAN:   Atrial tachycardia/PACs: Quiescent.  She has not needed any as needed's short acting diltiazem.  Not currently on a standing beta-blocker  or nondihydropyridine calcium channel blocker secondary to bradycardia, fatigue, and night terrors respectively.  Continue to minimize caffeine intake.  No further cardiac testing indicated at this time.  HTN: Blood pressure is reasonably controlled in the office today.  She remains on losartan 25 mg daily.  Fatigue: Improved off beta-blocker.  Obesity: Weight loss encouraged through heart healthy diet and regular exercise.     Disposition: F/u with Dr. Okey Dupre or an APP in 6 months.   Medication Adjustments/Labs and Tests Ordered: Current medicines are reviewed at length with the patient today.  Concerns regarding  medicines are outlined above. Medication changes, Labs and Tests ordered today are summarized above and listed in the Patient Instructions accessible in Encounters.   Signed, Eula Listen, PA-C 10/20/2023 5:00 PM     [facility] [address] [place], [place] [address] [phone]

## 2023-10-20 ENCOUNTER — Encounter: Payer: Self-pay | Admitting: Physician Assistant

## 2023-10-20 ENCOUNTER — Ambulatory Visit: Payer: Medicare PPO | Attending: Physician Assistant | Admitting: Physician Assistant

## 2023-10-20 VITALS — BP 130/88 | HR 73 | Ht 64.0 in | Wt 258.5 lb

## 2023-10-20 DIAGNOSIS — I10 Essential (primary) hypertension: Secondary | ICD-10-CM | POA: Diagnosis not present

## 2023-10-20 DIAGNOSIS — R5383 Other fatigue: Secondary | ICD-10-CM | POA: Diagnosis not present

## 2023-10-20 DIAGNOSIS — I4719 Other supraventricular tachycardia: Secondary | ICD-10-CM | POA: Diagnosis not present

## 2023-10-20 DIAGNOSIS — I491 Atrial premature depolarization: Secondary | ICD-10-CM

## 2023-10-20 DIAGNOSIS — R002 Palpitations: Secondary | ICD-10-CM

## 2023-10-20 NOTE — Patient Instructions (Signed)
Medication Instructions:  Your Physician recommend you continue on your current medication as directed.    *If you need a refill on your cardiac medications before your next appointment, please call your pharmacy*   Lab Work: None ordered at this time    Follow-Up: At [facility], you and your health needs are our priority.  As part of our continuing mission to provide you with exceptional heart care, we have created designated Provider Care Teams.  These Care Teams include your primary Cardiologist (physician) and Advanced Practice Providers (APPs -  Physician Assistants and Nurse Practitioners) who all work together to provide you with the care you need, when you need it.  We recommend signing up for the patient portal called "MyChart".  Sign up information is provided on this After Visit Summary.  MyChart is used to connect with patients for Virtual Visits (Telemedicine).  Patients are able to view lab/test results, encounter notes, upcoming appointments, etc.  Non-urgent messages can be sent to your provider as well.   To learn more about what you can do with MyChart, go to [email or web address].    Your next appointment:   6 month(s)  Provider:   You may see Yvonne Kendall, MD or one of the following Advanced Practice Providers on your designated Care Team:   Eula Listen, [place]

## 2023-11-01 ENCOUNTER — Other Ambulatory Visit: Payer: Self-pay | Admitting: Internal Medicine

## 2023-12-21 ENCOUNTER — Other Ambulatory Visit: Payer: Self-pay | Admitting: Physician Assistant

## 2024-01-18 IMAGING — MR MR LUMBAR SPINE W/O CM
5 series · 30 of 48 positions shown · non-contrast
Comparison: None.

CLINICAL DATA: Lumbar radiculopathy, symptoms persist with > 6 wks
treatment

EXAM:
MRI LUMBAR SPINE WITHOUT CONTRAST
TECHNIQUE: Multiplanar, multisequence MR imaging of the lumbar spine was
performed. No intravenous contrast was administered.

[Series 5: T2 · sagittal · 4.0mm · 0.81mm/px · 6 of 17 slices shown (1 of 2)]
[im 1/17]
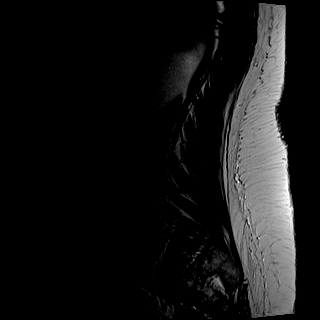
[im 4/17]
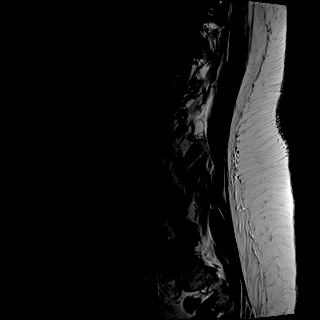
[im 7/17]
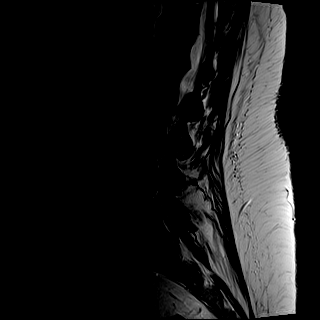
[im 10/17]
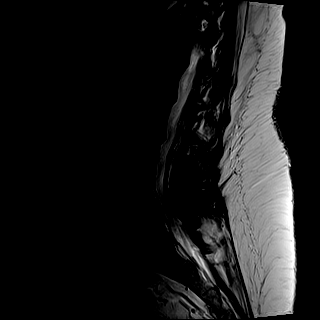
[im 13/17]
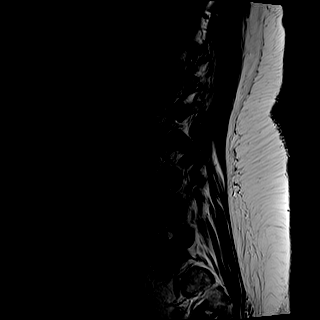
[im 17/17]
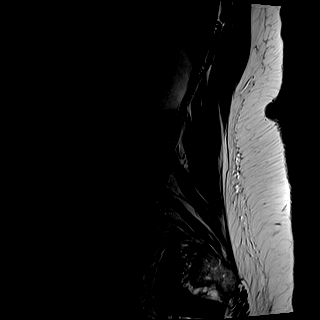

[Series 6: T1 · sagittal · 4.0mm · 0.81mm/px · 7 of 17 slices shown (1 of 2)]
[im 1/17]
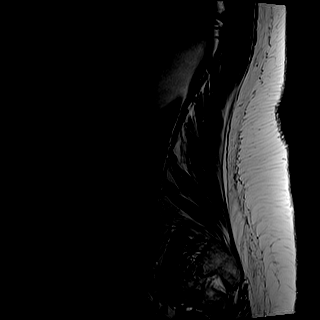
[im 3/17]
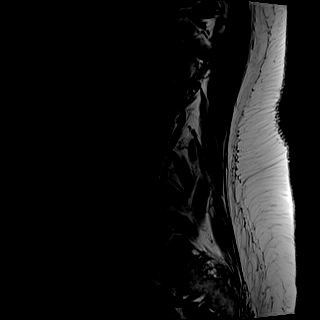
[im 6/17]
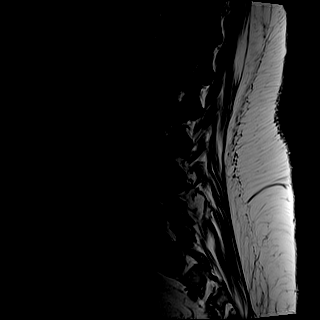
[im 9/17]
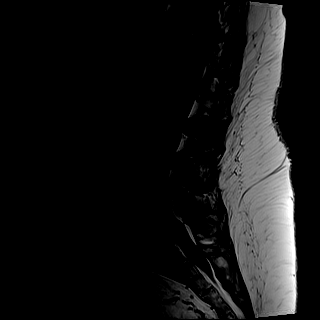
[im 11/17]
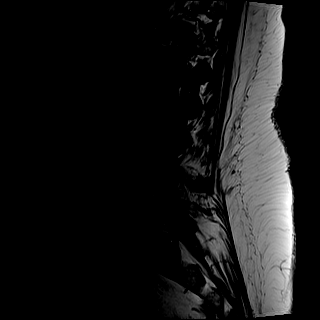
[im 14/17]
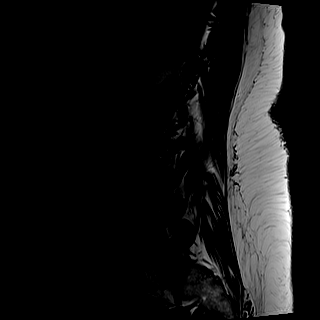
[im 17/17]
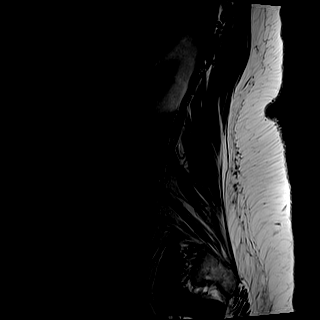

[Series 7: STIR · sagittal · 4.0mm · 0.41mm/px · 1 of 17 slices shown]
[im 1/17]
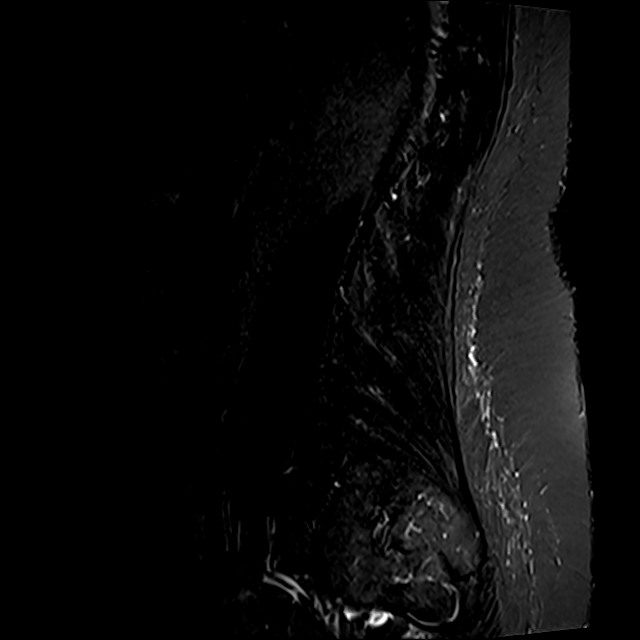

[Series 8: T2 · axial · 4.0mm · 0.78mm/px · z∈[-36,+168]mm · 8 of 33 slices shown (2 of 2)]
[im 1/33]
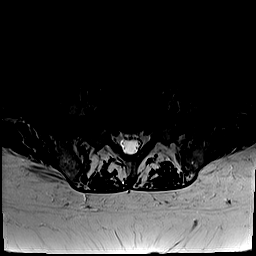
[im 5/33]
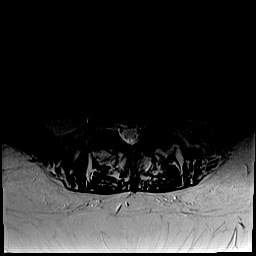
[im 10/33]
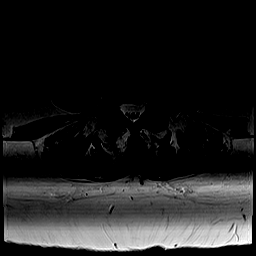
[im 15/33]
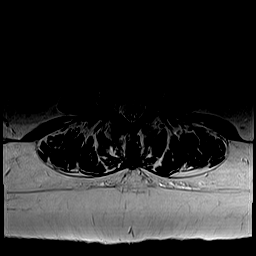
[im 18/33]
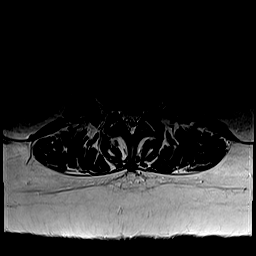
[im 23/33]
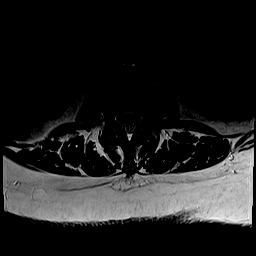
[im 28/33]
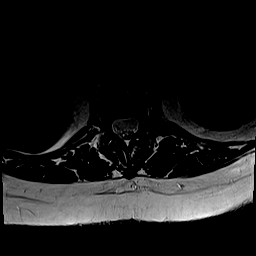
[im 33/33]
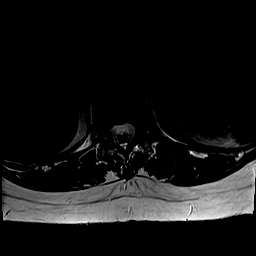

[Series 9: T1 · axial · 4.0mm · 0.39mm/px · z∈[-36,+168]mm · 8 of 33 slices shown (2 of 2)]
[im 1/33]
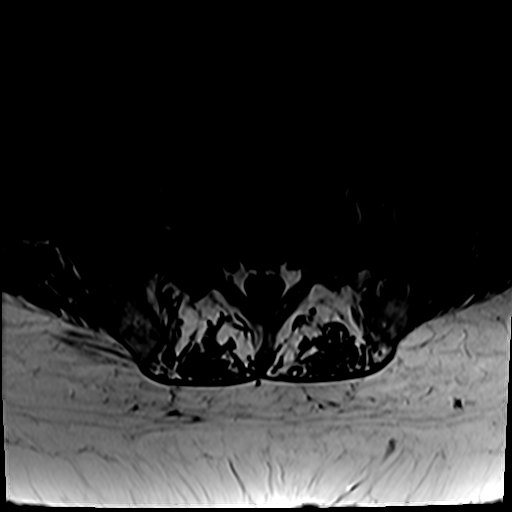
[im 5/33]
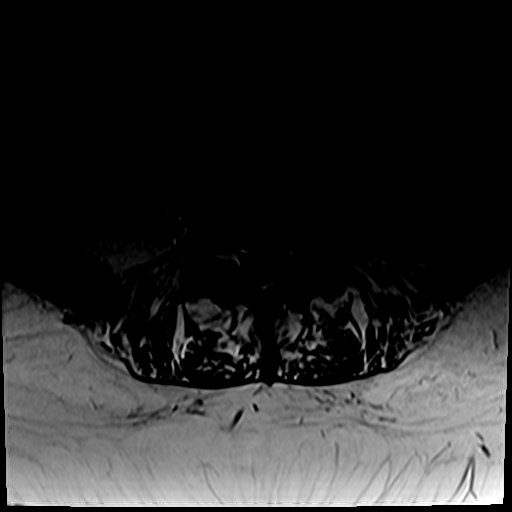
[im 10/33]
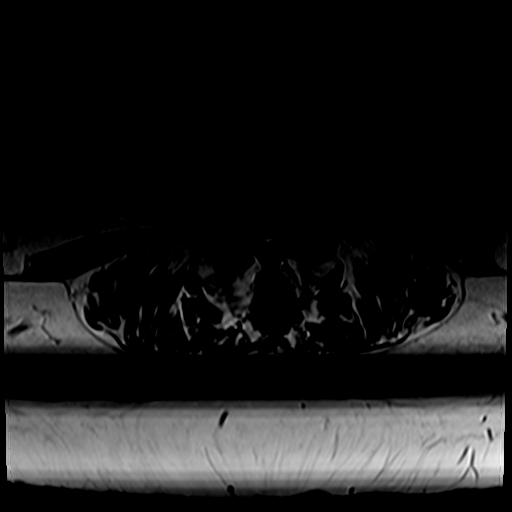
[im 15/33]
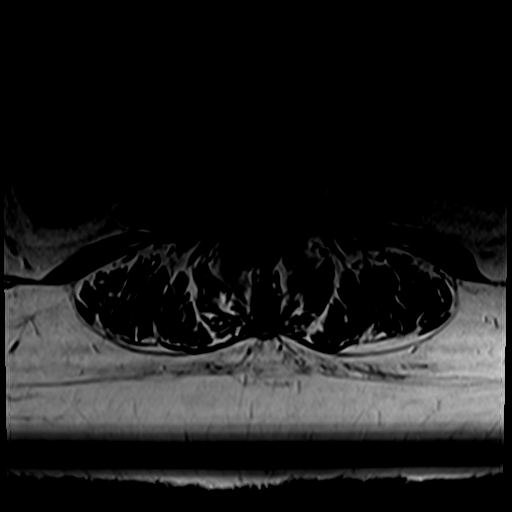
[im 18/33]
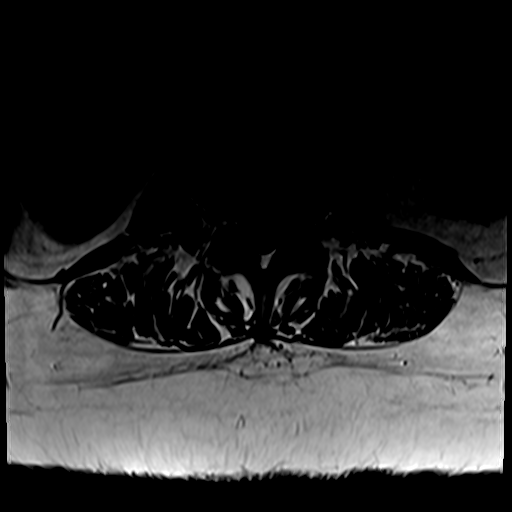
[im 23/33]
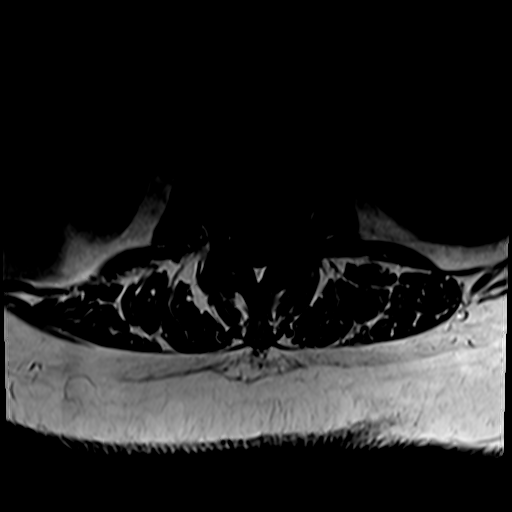
[im 28/33]
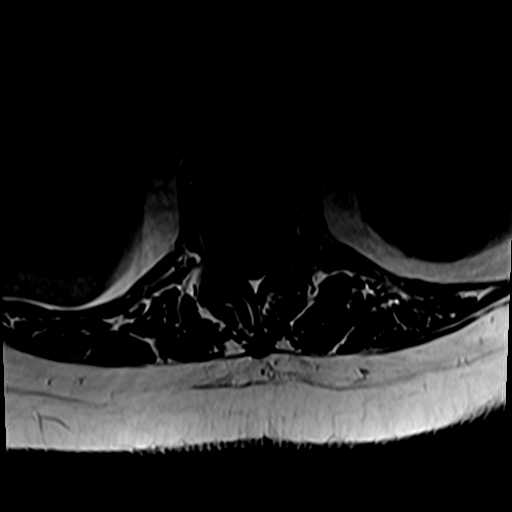
[im 33/33]
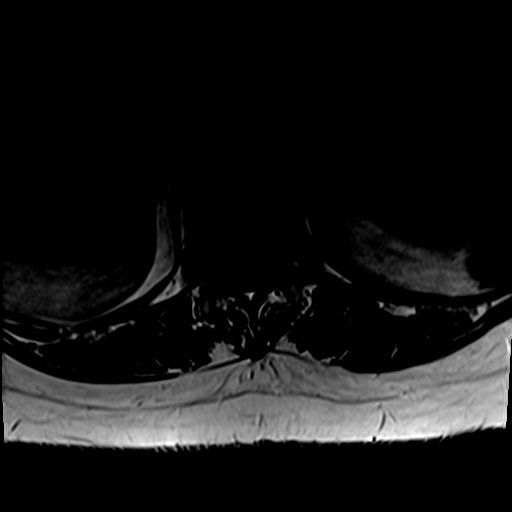

[30 of 48 positions shown; findings below may reference images not displayed]

FINDINGS: Segmentation: Standard segmentation is assumed. The inferior-most
fully formed intervertebral disc is labeled L5-S1.

Alignment:  Slight (grade 1) anterolisthesis of L4 on L5.

Vertebrae: Remote appearing L5 compression fracture with
approximately 10-15% height loss. No social marrow edema to suggest
acute fracture. No suspicious bone lesions. Scattered benign
vertebral venous malformations.

Conus medullaris and cauda equina: Conus extends to the L1 level.
Conus appears normal.

Paraspinal and other soft tissues: Unremarkable.

Disc levels:

T12-L1: Mild disc bulging without significant stenosis.

L1-L2: Mild disc bulge without significant stenosis.

L2-L3: Broad disc bulge with superimposed right paracentral disc
protrusion with slight inferior extension. Mild bilateral foraminal
stenosis. No significant canal stenosis.

L3-L4: Mild disc bulge with mild bilateral facet arthropathy. Mild
right foraminal stenosis without significant canal or left foraminal
stenosis.

L4-L5: Slight (grade 1) anterolisthesis of L4 on L5. Uncovering the
disc with superimposed broad disc bulge. Severe bilateral facet
arthropathy. Mildly prominent dorsal epidural fat. Resulting
moderate right and mild left foraminal stenosis. Mild canal stenosis
with right greater than left subarticular recess narrowing.

L5-S1: Mild broad disc bulge with superimposed small central disc
protrusion. Moderate bilateral facet arthropathy. Resulting
mild-to-moderate left foraminal stenosis. No significant canal or
right foraminal stenosis.
IMPRESSION: 1. At L4-L5, moderate right and mild left foraminal stenosis, mild
canal stenosis, and right greater than left subarticular recess
narrowing.
2. At L5-S1, mild-to-moderate left foraminal stenosis. Multilevel
mild foraminal stenosis is detailed above.
3. Remote L5 compression fracture with 10-15% height loss.

## 2024-01-27 ENCOUNTER — Other Ambulatory Visit: Payer: Self-pay | Admitting: Family Medicine

## 2024-01-27 DIAGNOSIS — F339 Major depressive disorder, recurrent, unspecified: Secondary | ICD-10-CM

## 2024-02-04 ENCOUNTER — Encounter: Payer: Self-pay | Admitting: Family Medicine

## 2024-02-04 ENCOUNTER — Ambulatory Visit: Payer: Medicare PPO | Admitting: Family Medicine

## 2024-02-04 DIAGNOSIS — E559 Vitamin D deficiency, unspecified: Secondary | ICD-10-CM

## 2024-02-04 DIAGNOSIS — F339 Major depressive disorder, recurrent, unspecified: Secondary | ICD-10-CM | POA: Diagnosis not present

## 2024-02-04 DIAGNOSIS — I4719 Other supraventricular tachycardia: Secondary | ICD-10-CM

## 2024-02-04 DIAGNOSIS — N3941 Urge incontinence: Secondary | ICD-10-CM

## 2024-02-04 DIAGNOSIS — J4540 Moderate persistent asthma, uncomplicated: Secondary | ICD-10-CM

## 2024-02-04 DIAGNOSIS — K219 Gastro-esophageal reflux disease without esophagitis: Secondary | ICD-10-CM

## 2024-02-04 DIAGNOSIS — E538 Deficiency of other specified B group vitamins: Secondary | ICD-10-CM

## 2024-02-04 DIAGNOSIS — I10 Essential (primary) hypertension: Secondary | ICD-10-CM

## 2024-02-04 DIAGNOSIS — D692 Other nonthrombocytopenic purpura: Secondary | ICD-10-CM | POA: Diagnosis not present

## 2024-02-04 DIAGNOSIS — M4807 Spinal stenosis, lumbosacral region: Secondary | ICD-10-CM

## 2024-02-04 DIAGNOSIS — M150 Primary generalized (osteo)arthritis: Secondary | ICD-10-CM

## 2024-02-04 MED ORDER — FLUOXETINE HCL 20 MG PO TABS
20.0000 mg | ORAL_TABLET | Freq: Every day | ORAL | 1 refills | Status: DC
Start: 2024-02-04 — End: 2024-08-07

## 2024-02-04 MED ORDER — ESOMEPRAZOLE MAGNESIUM 40 MG PO CPDR
40.0000 mg | DELAYED_RELEASE_CAPSULE | Freq: Every day | ORAL | 1 refills | Status: DC
Start: 2024-02-04 — End: 2024-08-07

## 2024-02-04 NOTE — Progress Notes (Signed)
 Name: Christie Hanson   MRN: [id number]    DOB: September 15, 1946   Date:02/04/2024       Progress Note  Subjective  Chief Complaint  Chief Complaint  Patient presents with   Medical Management of Chronic Issues   HPI   Metabolic Syndrome: last hgbA1C was 5.&%, she denies polyphagia, polydipsia or polyuria. She is eating more sweets since husband died, but will try to be more mindful    Hypertension Benign/PAC's/Atrial tachycardia : she could not tolerate Bystolic, she is now on Losartan, and lasix , she has Cardizem to take prn palpitation. No chest pain or palpitation .   Zio patch 03/2021: The patient was monitored for 14 days. The predominant rhythm was sinus with an average rate of 55 bpm (range 43-96 bpm and sinus). There were occasional PACs (3.9% burden) and rare PVCs. 4 atrial runs lasting up to 7 beats with a maximum rate of 148 bpm were observed. No sustained arrhythmia or prolonged pause was seen. Patient triggered events correspond to sinus rhythm, PACs, and PVCs.   Predominantly sinus rhythm with occasional PACs and rare PVCs.  A few brief episodes of PSVT also noted.    Asthma Moderate: She is now on Arnuity only and her  symptoms are controlled, no wheezing, she has chronic SOB and likely multifactorial that has been stable, she uses rescue inhaler very seldom  Unchanged    GERD: she has been doing well with Nexium daily and pepcid prn at night. She is stable on current regiment    Urinary incontinence: symptoms going on for years but getting worse, having to wear pads and change multiple times a day. She has urgency and cannot move fast due to knee OA We sent Detrol on her last visit but she never filled it.   Neuropathy: she states she has burning sensation on both feet for many years, she is on B12 supplementation, she states symptoms have been under control . Reviewed labs done Sep   Major Depression: she stopped taking all medication back in 2017. She stopped because  of side effects - tremors with Abilfy Tried cymbalta but it made her feel "weird" and stopped medication, it made her feel anxious, however decided to go back on Prozac 2022. She is still grieving husband died from complications of stroke 05/2023, now is stressed taking care of her sister.    Morbid Obesity: Her weight is down from 273 lbs to 265.4 lbs and now down to 259.8 lbs , initially lost weight due to becoming a widow. She states appetite is normal but eating less and cooking less often now    Vitamin D and Vitamin B12 deficiency: back to normal still on supplementation.    OA: she  has been less active and pain on both knees, currently seeing [facility]. Landry Mellow at [facility], she had hyaluronic acid injections and helped a little initially but afraid to go back due to heart issues that started around the same time as the injections . She takes Tylenol TID and it helps pain , today pain is 0/10 . She uses a cane to assist with gait   Chronic low back pain: January 23  had symptoms of sciatica,  she is still doing the home exercises that she learned her  PT  average pain is a 3/10 and stable    Patient Active Problem List   Diagnosis Date Noted   Asthma, well controlled 02/09/2023   Essential hypertension 04/06/2021   Palpitations 04/06/2021  BMI 45.0-49.9, adult (HCC) 02/23/2020   Senile purpura (HCC) 03/14/2018   Age-related macular degeneration 03/14/2018   Morbid obesity (HCC) 03/14/2018   Peripheral polyneuropathy 03/02/2017   Vitamin B12 deficiency 07/29/2015   Carpal tunnel syndrome 07/27/2015   Osteoarthritis 07/27/2015   Depression, major, recurrent, mild (HCC) 07/27/2015   Gastro-esophageal reflux disease without esophagitis 07/27/2015   Displacement of cervical intervertebral disc without myelopathy 07/27/2015   Hyperglycemia 07/27/2015   Dysmetabolic syndrome 07/27/2015   Vitamin D deficiency 07/27/2015   Sprain of rotator cuff capsule 07/27/2015   Urgency of urination  07/27/2015   Allergic rhinitis 07/27/2015   Asthma, moderate persistent 07/27/2015    Past Surgical History:  Procedure Laterality Date   ABDOMINAL HYSTERECTOMY  1991   BILATERAL SALPINGOOPHORECTOMY     CARPAL TUNNEL RELEASE Bilateral [id number]   [facility]   CATARACT EXTRACTION W/PHACO Left 09/23/2015   Procedure: CATARACT EXTRACTION PHACO AND INTRAOCULAR LENS PLACEMENT (IOC);  Surgeon: Lockie Mola, MD;  Location: [facility] ORS;  Service: Ophthalmology;  Laterality: Left;  [place] 00:57 AP% 12.2 6.98 CDE Fluid pack lot # [id number]   CATARACT EXTRACTION W/PHACO Right 10/17/2015   Procedure: CATARACT EXTRACTION PHACO AND INTRAOCULAR LENS PLACEMENT (IOC);  Surgeon: Lockie Mola, MD;  Location: [facility] ORS;  Service: Ophthalmology;  Laterality: Right;  [place]  00:30.5 AP   00:47.2 CDE  6.05 casette lot #[id number]   DandC     DILATION AND CURETTAGE OF UTERUS  1990   HERNIA REPAIR  1992   ventral    Family History  Problem Relation Age of Onset   Hypertension Mother    Cancer Father        prostate   CVA Father    Depression Father    Healthy Sister    Cancer Brother        prostate    Social History   Tobacco Use   Smoking status: Never   Smokeless tobacco: Never   Tobacco comments:    smoking cessation materials not required  Substance Use Topics   Alcohol use: No    Alcohol/week: 0.0 standard drinks of alcohol     Current Outpatient Medications:    acetaminophen (TYLENOL) 500 MG tablet, Take 500 mg by mouth in the morning, at noon, and at bedtime., Disp: , Rfl:    albuterol (PROAIR HFA) 108 (90 Base) MCG/ACT inhaler, INHALE TWO PUFFS EVERY 6 HOURS AS NEEDEDWHEEZING, Disp: 8.5 g, Rfl: 1   Cholecalciferol (VITAMIN D) 2000 UNITS tablet, Take 1 tablet by mouth daily. Bedtime, Disp: , Rfl:    famotidine (PEPCID) 40 MG tablet, TAKE 1 TABLET BY MOUTH EVERYDAY AT BEDTIME, Disp: 90 tablet, Rfl: 0   Fluticasone Furoate (ARNUITY ELLIPTA) 100 MCG/ACT AEPB, Inhale 1 puff into the lungs  daily., Disp: 30 each, Rfl: 5   furosemide (LASIX) 20 MG tablet, TAKE 1 TABLET BY MOUTH EVERY DAY, Disp: 90 tablet, Rfl: 1   losartan (COZAAR) 25 MG tablet, Take 1 tablet (25 mg total) by mouth daily., Disp: 90 tablet, Rfl: 2   Multiple Vitamins-Minerals (ICAPS AREDS 2) CAPS, Take 2 capsules by mouth daily. , Disp: , Rfl:    Probiotic Product (UP4 PROBIOTICS WOMENS PO), Take by mouth daily., Disp: , Rfl:    vitamin B-12 (CYANOCOBALAMIN) 1000 MCG tablet, Take 1,000 mcg by mouth daily. Bedtime, Disp: , Rfl:    diltiazem (CARDIZEM) 30 MG tablet, Take 1 tablet (30 mg total) by mouth 2 (two) times daily as needed (As needed for fast heart rates or  palpitations)., Disp: 90 tablet, Rfl: 3   esomeprazole (NEXIUM) 40 MG capsule, Take 1 capsule (40 mg total) by mouth daily., Disp: 90 capsule, Rfl: 1   FLUoxetine (PROZAC) 20 MG tablet, Take 1 tablet (20 mg total) by mouth daily., Disp: 90 tablet, Rfl: 1  Allergies  Allergen Reactions   Breo Ellipta [Fluticasone Furoate-Vilanterol]     Palpitation    Duloxetine     Mental fogginess    Morphine    Penicillins    Sulfa Antibiotics     I personally reviewed active problem list, medication list, allergies, family history with the patient/caregiver today.   ROS  Ten systems reviewed and is negative except as mentioned in HPI    Objective  Vitals:   02/04/24 1055 02/04/24 1131  BP: (!) 146/84 132/80  Pulse: 78   Resp: 16   Weight: 259 lb 12.8 oz (117.8 kg)   Height: 5\' 4"  (1.626 m)     Body mass index is 44.59 kg/m.  Physical Exam  Constitutional: Patient appears well-developed and well-nourished. Obese  No distress.  HEENT: head atraumatic, normocephalic, pupils equal and reactive to light, neck supple Cardiovascular: Normal rate, regular rhythm and normal heart sounds.  No murmur heard. No BLE edema. Pulmonary/Chest: Effort normal and breath sounds normal. No respiratory distress. Abdominal: Soft.  There is no  tenderness. Psychiatric: Patient has a normal mood and affect. behavior is normal. Judgment and thought content normal.   Diabetic Foot Exam:     PHQ2/9:    02/04/2024   10:54 AM 08/05/2023   10:56 AM 02/09/2023   11:08 AM 08/11/2022   11:08 AM 02/05/2022    9:42 AM  Depression screen PHQ 2/9  Decreased Interest 1 2 2 2 2   Down, Depressed, Hopeless 1 3 0 0 1  PHQ - 2 Score 2 5 2 2 3   Altered sleeping 0 0 0 3 0  Tired, decreased energy 1 2 1 3 3   Change in appetite 0 0 0 0 0  Feeling bad or failure about yourself  0 0 0 0 0  Trouble concentrating 1 0 0 0 0  Moving slowly or fidgety/restless 0 0 0 0 0  Suicidal thoughts 0 0 0 0 0  PHQ-9 Score 4 7 3 8 6   Difficult doing work/chores Somewhat difficult Somewhat difficult Somewhat difficult      phq 9 is positive  Fall Risk:    02/04/2024   10:50 AM 08/05/2023   10:56 AM 02/09/2023   11:04 AM 08/11/2022   11:08 AM 02/05/2022    9:42 AM  Fall Risk   Falls in the past year? 0 1 1 0 0  Number falls in past yr: 0 0 0 0 0  Injury with Fall? 0 1 1 0 0  Risk for fall due to : No Fall Risks History of fall(s) History of fall(s) No Fall Risks Impaired balance/gait  Follow up Falls prevention discussed;Education provided;Falls evaluation completed Falls prevention discussed;Education provided;Falls evaluation completed Falls prevention discussed;Education provided;Falls evaluation completed Falls prevention discussed Falls prevention discussed     Assessment & Plan  1. Morbid obesity (HCC) (Primary)  Discussed with the patient the risk posed by an increased BMI. Discussed importance of portion control, calorie counting and at least 150 minutes of physical activity weekly. Avoid sweet beverages and drink more water. Eat at least 6 servings of fruit and vegetables daily    2. Chronic recurrent major depressive disorder (HCC)  - FLUoxetine (PROZAC) 20 MG  tablet; Take 1 tablet (20 mg total) by mouth daily.  Dispense: 90 tablet; Refill:  1  3. Senile purpura (HCC)  Stable and reassurance given   4. Atrial tachycardia (HCC)  Doing well   5. Gastro-esophageal reflux disease without esophagitis  - esomeprazole (NEXIUM) 40 MG capsule; Take 1 capsule (40 mg total) by mouth daily.  Dispense: 90 capsule; Refill: 1  6. Vitamin B12 deficiency  Continue supplementation  7. Vitamin D deficiency  Continue supplementation   8. Asthma, moderate persistent, well-controlled  Under control   9. Benign hypertension  BP improved with rest, continue current regiment  10. Primary osteoarthritis involving multiple joints  stable  11. Spinal stenosis of lumbosacral region  Tylenol   12. Urge incontinence of urine  She prefers not taking medication, afraid of side effects

## 2024-03-11 ENCOUNTER — Other Ambulatory Visit: Payer: Self-pay | Admitting: Family Medicine

## 2024-03-11 DIAGNOSIS — J4540 Moderate persistent asthma, uncomplicated: Secondary | ICD-10-CM

## 2024-03-13 ENCOUNTER — Other Ambulatory Visit: Payer: Self-pay | Admitting: Family Medicine

## 2024-03-13 DIAGNOSIS — J4540 Moderate persistent asthma, uncomplicated: Secondary | ICD-10-CM

## 2024-03-13 NOTE — Telephone Encounter (Signed)
 Copied from CRM [id number]. Topic: Clinical - Medication Refill >> Mar 13, 2024  8:35 AM Rosaria Common wrote: Most Recent Primary Care Visit:  Provider: Arleen Lacer  Department: [facility]  Visit Type: OFFICE VISIT  Date: 02/04/2024  Medication: Fluticasone  Furoate (ARNUITY ELLIPTA ) 100 MCG/ACT AEPB  Has the patient contacted their pharmacy? Yes (Agent: If no, request that the patient contact the pharmacy for the refill. If patient does not wish to contact the pharmacy document the reason why and proceed with request.) (Agent: If yes, when and what did the pharmacy advise?)  Is this the correct pharmacy for this prescription? Yes If no, delete pharmacy and type the correct one.  This is the patient's preferred pharmacy:  [id number] - [place], [place] - [address] [place] [address] Phone: [phone] Fax: [phone]   Has the prescription been filled recently? Yes  Is the patient out of the medication? Yes  Has the patient been seen for an appointment in the last year OR does the patient have an upcoming appointment? Yes  Can we respond through MyChart? Yes  Agent: Please be advised that Rx refills may take up to 3 business days. We ask that you follow-up with your pharmacy.

## 2024-03-13 NOTE — Telephone Encounter (Signed)
 Last f/u 02/04/24

## 2024-04-23 NOTE — Progress Notes (Unsigned)
 Cardiology Office Note    Date:  04/24/2024   ID:  Christie Hanson, DOB September 21, 1946, MRN [id number]  PCP:  Sowles, Krichna, MD  Cardiologist:  Sammy Crisp, MD  Electrophysiologist:  None   Chief Complaint: Follow up  History of Present Illness:   Christie Hanson is a 78 y.o. female with history of HTN, atrial tachycardia, PACs, hyperglycemia, asthma, vertigo, GERD, depression, and obesity who presents for follow up of HTN.   She was evaluated as a new patient by Dr. Nolan Battle on 04/04/2021 at the request of her PCP for hypertension. At that visit, she noted elevated BP was just recently diagnosed when she presented to her orthopedist for joint injections, though did report, in hindsight, her BPs had been elevated for several months at home. She had been seen in the ED in 02/2021 with elevated BP of > 200 mmHg at home with reassuring work up and recommendation for her to follow up with her PCP. She also noted sporadic palpitations with her heart rate increasing up to 140 bpm, with episodes usually lasting 15 seconds in duration, with 1 episode lasting approximately 40 minutes before spontaneous resolution. She noted, in the past, her palpitations were associated with excess caffeine intake, though this had not been the case with those more recent episodes. With regards to her BP, she had been started on HCTZ and Bystolic  by her PCP in early 03/2021, with subsequent development of fatigue and heart rates in the 50s bpm at times. At her visit with Dr. Nolan Battle, her BP was reasonably controlled at 136/70 with a heart rate of 59 bpm. Her Bystolic  was decreased to 5 mg and sodium restriction was advised. Subsequent outpatient cardiac monitoring showed a predominant rhythm of sinus with an average heart rate of 55 bpm (range 43-96 bpm in sinus), occasional PACs representing a 3.9% burden, rare PVCs, 4 atrial runs lasting up to 7 beats with a maximum rate of 148 bpm, and no sustained arrhythmias or prolonged  pauses noted. Patient triggered events corresponded to sinus rhythm, PACs, and PVCs.    She was seen in follow-up on 05/20/2021, and continued to note significant fatigue and somnolence since initiating Bystolic .  She reported prior to initiating Bystolic , her activity level was much better.  Home BPs were in the 1 teens to 130s over 60s to 70s.  Her palpitations remained largely unchanged despite cessation of caffeine.  Given continued fatigue, Bystolic  was discontinued.  She was placed on Cardizem  LA 120 mg daily.   She was seen in the office in 06/2021 and noted improvement in her underlying fatigue following discontinuation of beta-blocker.  She did feel like Cardizem  had helped with her palpitations, however since initiating this medication she noted an increase in nightmares and difficulty falling asleep with noted mind racing.  She also noted some lower extremity swelling following the initiation of Cardizem .  There was an increase in her vertigo.  BP at home had been well controlled in the 130s to 140s mmHg systolic with heart rates in the 60s to 70s bpm off beta-blocker.  She noted xerostomia with chlorthalidone .  Given nightmares, Cardizem  was discontinued with recommendation for watchful waiting regarding palpitations and recommendation for her to take as needed short acting diltiazem  for sustained tachypalpitations.  With noted xerostomia, chlorthalidone  was discontinued and she was started on losartan  25 mg.   She was seen in the office on 07/31/2021 and was doing well from a cardiac perspective.  She was under increased stress  at home with the health of several family members.  With this, she noted an uptrend in her BP into the 140s to 150s systolic.  She had not needed any as needed diltiazem .  She was tolerating losartan  without issues.  With the discontinuation of chlorthalidone  on a daily basis she noted resolution of xerostomia.  Her weight was up approximately 4 pounds since her prior visit.   She was started back on chlorthalidone  12.5 mg every other day.  She was last seen in the office in 10/2023 and continued to do well from a cardiac perspective.  No changes in cardiac pharmacotherapy were indicated at that time.  She comes in doing well from a cardiac perspective and is without symptoms of angina or cardiac decompensation.  Potation's, near-syncope, or syncope.  She does report some positional dizziness if she stands up quickly.  She has not needed any as needed diltiazem .  She was recently knocked down by a dog and suffered a fracture along the right forearm with the right wrist currently in a brace.  She did not hit her head or suffer LOC.  Currently unable to check her blood pressures at home.  She does not have any acute cardiac concerns at this time.   Labs independently reviewed: 07/2023 - A1c 5.7, BUN 16, serum creatinine 0.92, potassium 5.0, Hgb 14.2, PLT 269 06/2021 - TSH normal, magnesium  2.2 07/2020 - albumin 4.3, AST/ALT normal 01/2016 - TC 156, TG 86, HDL 45, LDL 94  Past Medical History:  Diagnosis Date   Allergy    Asthma    wheezing   Blurred vision    Candidiasis of mouth    Depression    hx of   GERD (gastroesophageal reflux disease)    Herniated disc, cervical    History of carpal tunnel release    Hyperglycemia    Metabolic syndrome    Obesity    Osteoarthritis    Ovarian deficiency    Vitamin D  deficiency     Past Surgical History:  Procedure Laterality Date   ABDOMINAL HYSTERECTOMY  1991   BILATERAL SALPINGOOPHORECTOMY     CARPAL TUNNEL RELEASE Bilateral [id number]   [facility]   CATARACT EXTRACTION W/PHACO Left 09/23/2015   Procedure: CATARACT EXTRACTION PHACO AND INTRAOCULAR LENS PLACEMENT (IOC);  Surgeon: Annell Kidney, MD;  Location: [facility];  Service: Ophthalmology;  Laterality: Left;  [place]  00:57 AP% 12.2 6.98 CDE Fluid pack lot # [id number]   CATARACT EXTRACTION W/PHACO Right 10/17/2015   Procedure: CATARACT EXTRACTION PHACO AND INTRAOCULAR  LENS PLACEMENT (IOC);  Surgeon: Annell Kidney, MD;  Location: [facility];  Service: Ophthalmology;  Laterality: Right;  US   00:30.5 AP   00:47.2 CDE  6.05 casette lot #[id number]   DandC     DILATION AND CURETTAGE OF UTERUS  1990   HERNIA REPAIR  1992   ventral    Current Medications: No outpatient medications have been marked as taking for the 04/24/24 encounter (Office Visit) with Roark Chick, PA-C.    Allergies:   Breo ellipta  [fluticasone  furoate-vilanterol], Duloxetine , Morphine, Penicillins, and Sulfa antibiotics   Social History   Socioeconomic History   Marital status: Widowed    Spouse name: Andy Bannister   Number of children: 4   Years of education: Not on file   Highest education level: Associate degree: occupational, Scientist, product/process development, or vocational program  Occupational History   Occupation: Retired  Tobacco Use   Smoking status: Never   Smokeless tobacco: Never   Tobacco comments:  smoking cessation materials not required  Vaping Use   Vaping status: Never Used  Substance and Sexual Activity   Alcohol use: No    Alcohol/week: 0.0 standard drinks of alcohol   Drug use: No   Sexual activity: Yes    Partners: Male  Other Topics Concern   Not on file  Social History Narrative   Not on file   Social Drivers of Health   Financial Resource Strain: Low Risk  (01/31/2024)   Overall Financial Resource Strain (CARDIA)    Difficulty of Paying Living Expenses: Not very hard  Food Insecurity: No Food Insecurity (01/31/2024)   Hunger Vital Sign    Worried About Running Out of Food in the Last Year: Never true    Ran Out of Food in the Last Year: Never true  Transportation Needs: No Transportation Needs (01/31/2024)   PRAPARE - Administrator, Civil Service (Medical): No    Lack of Transportation (Non-Medical): No  Physical Activity: Unknown (01/31/2024)   Exercise Vital Sign    Days of Exercise per Week: 0 days    Minutes of Exercise per Session: Not on file   Stress: No Stress Concern Present (01/31/2024)   [employer] of Occupational Health - Occupational Stress Questionnaire    Feeling of Stress : Only a little  Social Connections: Unknown (01/31/2024)   Social Connection and Isolation Panel [NHANES]    Frequency of Communication with Friends and Family: More than three times a week    Frequency of Social Gatherings with Friends and Family: Twice a week    Attends Religious Services: Patient declined    Database administrator or Organizations: Yes    Attends Banker Meetings: Patient declined    Marital Status: Widowed     Family History:  The patient's family history includes CVA in her father; Cancer in her brother and father; Depression in her father; Healthy in her sister; Hypertension in her mother.  ROS:   12-point review of systems is negative unless otherwise noted in the HPI.   EKGs/Labs/Other Studies Reviewed:    Studies reviewed were summarized above. The additional studies were reviewed today:  Zio patch 03/2021: The patient was monitored for 14 days. The predominant rhythm was sinus with an average rate of 55 bpm (range 43-96 bpm and sinus). There were occasional PACs (3.9% burden) and rare PVCs. 4 atrial runs lasting up to 7 beats with a maximum rate of 148 bpm were observed. No sustained arrhythmia or prolonged pause was seen. Patient triggered events correspond to sinus rhythm, PACs, and PVCs.   Predominantly sinus rhythm with occasional PACs and rare PVCs.  A few brief episodes of PSVT also noted.   EKG:  EKG is ordered today.  The EKG ordered today demonstrates NSR, 63 bpm, occasional PACs, no acute st/t changes  Recent Labs: 08/05/2023: ALT 9; BUN 16; Creat 0.92; Hemoglobin 14.2; Platelets 269; Potassium 5.0; Sodium 137  Recent Lipid Panel    Component Value Date/Time   CHOL 156 01/24/2016 0920   TRIG 86 01/24/2016 0920   HDL 45 01/24/2016 0920   CHOLHDL 3.5 01/24/2016 0920   LDLCALC 94  01/24/2016 0920    PHYSICAL EXAM:    VS:  BP (!) 148/80   Pulse 63   Ht 5\' 4"  (1.626 m)   Wt 258 lb 6.4 oz (117.2 kg)   SpO2 95%   BMI 44.35 kg/m   BMI: Body mass index is 44.35 kg/m.  Physical  Exam Vitals reviewed.  Constitutional:      Appearance: She is well-developed.  HENT:     Head: Normocephalic and atraumatic.  Eyes:     General:        Right eye: No discharge.        Left eye: No discharge.  Cardiovascular:     Rate and Rhythm: Normal rate and regular rhythm.     Heart sounds: Normal heart sounds, S1 normal and S2 normal. Heart sounds not distant. No midsystolic click and no opening snap. No murmur heard.    No friction rub.  Pulmonary:     Effort: Pulmonary effort is normal. No respiratory distress.     Breath sounds: Normal breath sounds. No decreased breath sounds, wheezing, rhonchi or rales.  Chest:     Chest wall: No tenderness.  Musculoskeletal:     Cervical back: Normal range of motion.     Comments: Right forearm/wrist in brace.  Skin:    General: Skin is warm and dry.     Nails: There is no clubbing.  Neurological:     Mental Status: She is alert and oriented to person, place, and time.  Psychiatric:        Speech: Speech normal.        Behavior: Behavior normal.        Thought Content: Thought content normal.        Judgment: Judgment normal.     Wt Readings from Last 3 Encounters:  04/24/24 258 lb 6.4 oz (117.2 kg)  02/04/24 259 lb 12.8 oz (117.8 kg)  10/20/23 258 lb 8 oz (117.3 kg)     ASSESSMENT & PLAN:   Atrial tachycardia/PACs: Quiescent.  No longer on standing beta-blocker or nondihydropyridine calcium channel blocker secondary to bradycardia, fatigue, and night terrors.  She has not needed any as needed short acting diltiazem .  Continue to minimize caffeine intake.  No further cardiac testing indicated at this time.  HTN: Blood pressure is mildly elevated in the office today, though typically well-controlled.  She remains on  losartan  25 mg.  Fatigue: Improved off beta-blocker.  Obesity: Weight loss encouraged through heart healthy diet and regular exercise.    Disposition: F/u with Dr. Nolan Battle or an APP in 12 months.   Medication Adjustments/Labs and Tests Ordered: Current medicines are reviewed at length with the patient today.  Concerns regarding medicines are outlined above. Medication changes, Labs and Tests ordered today are summarized above and listed in the Patient Instructions accessible in Encounters.   Signed, Varney Gentleman, PA-C 04/24/2024 1:05 PM     [facility] [place] [address] [place], [place] [address] [phone]

## 2024-04-24 ENCOUNTER — Encounter: Payer: Self-pay | Admitting: Physician Assistant

## 2024-04-24 ENCOUNTER — Ambulatory Visit: Payer: Medicare PPO | Attending: Physician Assistant | Admitting: Physician Assistant

## 2024-04-24 VITALS — BP 148/80 | HR 63 | Ht 64.0 in | Wt 258.4 lb

## 2024-04-24 DIAGNOSIS — I4719 Other supraventricular tachycardia: Secondary | ICD-10-CM

## 2024-04-24 DIAGNOSIS — R5383 Other fatigue: Secondary | ICD-10-CM

## 2024-04-24 DIAGNOSIS — I491 Atrial premature depolarization: Secondary | ICD-10-CM | POA: Diagnosis not present

## 2024-04-24 DIAGNOSIS — R002 Palpitations: Secondary | ICD-10-CM

## 2024-04-24 DIAGNOSIS — I10 Essential (primary) hypertension: Secondary | ICD-10-CM

## 2024-04-24 NOTE — Patient Instructions (Signed)
 Medication Instructions:  Your physician recommends that you continue on your current medications as directed. Please refer to the Current Medication list given to you today.   *If you need a refill on your cardiac medications before your next appointment, please call your pharmacy*  Lab Work: None ordered at this time   Follow-Up: At [facility], you and your health needs are our priority.  As part of our continuing mission to provide you with exceptional heart care, our providers are all part of one team.  This team includes your primary Cardiologist (physician) and Advanced Practice Providers or APPs (Physician Assistants and Nurse Practitioners) who all work together to provide you with the care you need, when you need it.  Your next appointment:   12 month(s)  Provider:   You may see Sammy Crisp, MD or Varney Gentleman, PA-C

## 2024-05-30 ENCOUNTER — Telehealth: Payer: Self-pay | Admitting: Family Medicine

## 2024-05-30 NOTE — Telephone Encounter (Signed)
 No order received through fax yet. Unable to reach April

## 2024-05-30 NOTE — Telephone Encounter (Signed)
 Copied from CRM [id number]. Topic: Clinical - Request for Lab/Test Order >> May 30, 2024 12:35 PM Mia F wrote: Reason for CRM: April from [facility] calling to see if office has received a request for an order for a bone density scan. It was faxed on 7/1. April [phone]. Please fax order to [phone].

## 2024-06-07 NOTE — Telephone Encounter (Unsigned)
 Copied from CRM [id number]. Topic: General - Other >> Jun 07, 2024 10:16 AM Annabella S wrote: Reason for CRM: Following up on paperwork sent 05/16/24 please follow up with April [employer] [phone]

## 2024-06-07 NOTE — Telephone Encounter (Signed)
 Called April back no answer left detailed message:  No forms have been received to please re fax it [phone].

## 2024-06-14 ENCOUNTER — Other Ambulatory Visit: Payer: Self-pay | Admitting: Physician Assistant

## 2024-07-12 ENCOUNTER — Telehealth: Payer: Self-pay

## 2024-07-12 NOTE — Telephone Encounter (Signed)
 Copied from CRM #[id number]. Topic: General - Other >> Jul 12, 2024  1:43 PM Fonda T wrote: Reason for CRM: Received call from April, Nurse with [facility], calling to follow up on paperwork that was faxed to office on 06/08/24.  Paperwork was regarding a recent bone fracture, and attempting to have bone density test ordered.  April can be reached back at to discuss further [phone].  Per April will re-fax paperwork, and requesting a follow up call once completed/received. Fax number as listed given per KMS.

## 2024-07-12 NOTE — Telephone Encounter (Signed)
 No forms received yet

## 2024-07-13 ENCOUNTER — Other Ambulatory Visit: Payer: Self-pay | Admitting: Family Medicine

## 2024-07-13 DIAGNOSIS — Z8781 Personal history of (healed) traumatic fracture: Secondary | ICD-10-CM

## 2024-07-14 NOTE — Telephone Encounter (Signed)
 Left detailed vm stating order was placed.

## 2024-07-28 ENCOUNTER — Other Ambulatory Visit: Payer: Self-pay | Admitting: Internal Medicine

## 2024-08-07 ENCOUNTER — Ambulatory Visit: Admitting: Family Medicine

## 2024-08-07 ENCOUNTER — Encounter: Payer: Self-pay | Admitting: Family Medicine

## 2024-08-07 DIAGNOSIS — F339 Major depressive disorder, recurrent, unspecified: Secondary | ICD-10-CM

## 2024-08-07 DIAGNOSIS — R739 Hyperglycemia, unspecified: Secondary | ICD-10-CM | POA: Diagnosis not present

## 2024-08-07 DIAGNOSIS — E559 Vitamin D deficiency, unspecified: Secondary | ICD-10-CM | POA: Diagnosis not present

## 2024-08-07 DIAGNOSIS — I10 Essential (primary) hypertension: Secondary | ICD-10-CM | POA: Diagnosis not present

## 2024-08-07 DIAGNOSIS — M150 Primary generalized (osteo)arthritis: Secondary | ICD-10-CM | POA: Diagnosis not present

## 2024-08-07 DIAGNOSIS — E538 Deficiency of other specified B group vitamins: Secondary | ICD-10-CM

## 2024-08-07 DIAGNOSIS — I4719 Other supraventricular tachycardia: Secondary | ICD-10-CM | POA: Diagnosis not present

## 2024-08-07 DIAGNOSIS — K219 Gastro-esophageal reflux disease without esophagitis: Secondary | ICD-10-CM

## 2024-08-07 DIAGNOSIS — J4540 Moderate persistent asthma, uncomplicated: Secondary | ICD-10-CM

## 2024-08-07 DIAGNOSIS — M4807 Spinal stenosis, lumbosacral region: Secondary | ICD-10-CM

## 2024-08-07 DIAGNOSIS — Z23 Encounter for immunization: Secondary | ICD-10-CM | POA: Diagnosis not present

## 2024-08-07 MED ORDER — ARNUITY ELLIPTA 100 MCG/ACT IN AEPB: 1.0000 | INHALATION_SPRAY | Freq: Every evening | RESPIRATORY_TRACT | 5 refills | Status: AC

## 2024-08-07 MED ORDER — ESOMEPRAZOLE MAGNESIUM 40 MG PO CPDR: 40.0000 mg | DELAYED_RELEASE_CAPSULE | Freq: Every day | ORAL | 1 refills | Status: AC

## 2024-08-07 MED ORDER — FAMOTIDINE 40 MG PO TABS: 40.0000 mg | ORAL_TABLET | Freq: Every day | ORAL | 0 refills | Status: DC

## 2024-08-07 MED ORDER — FLUOXETINE HCL 20 MG PO TABS: 20.0000 mg | ORAL_TABLET | Freq: Every day | ORAL | 1 refills | Status: DC

## 2024-08-07 NOTE — Progress Notes (Signed)
 Name: Christie Hanson   MRN: [id number]    DOB: 1946/02/10   Date:08/07/2024       Progress Note  Subjective  Chief Complaint  Chief Complaint  Patient presents with   Medical Management of Chronic Issues   Discussed the use of AI scribe software for clinical note transcription with the patient, who gave verbal consent to proceed.  History of Present Illness Christie Hanson is a 78 year old female with hypertension and atrial tachycardia who presents for a routine follow-up.  She experienced a fall in May 2025, resulting in a simple fracture near her wrist after being bumped by a dog. She was treated with a splint at an orthopedic clinic and did not require surgery.  She has a history of atrial tachycardia and irregular heartbeat, previously managed with Bystolic  and Cardizem  LA. Currently, she takes HCTZ and losartan  daily for hypertension and uses short-acting diltiazem  as needed for heart rate control. Her heart rate has been variable, ranging from 43 to 96 bpm, with episodes of PACs, PVCs, and atrial runs. She experiences fatigue but has not taken Cardizem  recently.  Her blood pressure was noted to be slightly elevated during a recent orthopedic visit. She takes losartan  25 mg daily for hypertension.  She has a history of obesity, with a gradual weight loss from 259 lbs to 256.2 lbs since June 2025. She primarily consumes microwave meals and sandwiches, acknowledging the high salt content but finds them convenient.  She has a history of major depression, currently managed with Prozac  20 mg daily since 2023/09/03 after her husband's death. Her mood is stable but she acknowledges occasional loneliness.  She experiences neuropathy with burning in her feet, which has improved with B12 supplementation. She takes Nexium  daily and Pepcid  as needed for reflux, which is well-controlled.  She has prediabetes with no significant symptoms of diabetes, though she reports being 'really thirsty.'  She has reduced her intake of sweets.  She has moderate asthma, managed with Arnuity once daily, and reports minimal wheezing, primarily during allergy season. She seldom uses albuterol .  She has chronic back pain and spinal stenosis, with occasional soreness but no sciatica. She uses a cane for knee arthritis and balance issues. She has not pursued further orthopedic interventions due to concerns about cardiac episodes triggered by lidocaine  injections.    Patient Active Problem List   Diagnosis Date Noted   Asthma, well controlled 02/09/2023   Essential hypertension 04/06/2021   Palpitations 04/06/2021   BMI 45.0-49.9, adult (HCC) 02/23/2020   Senile purpura (HCC) 03/14/2018   Age-related macular degeneration 03/14/2018   Morbid obesity (HCC) 03/14/2018   Peripheral polyneuropathy 03/02/2017   Vitamin B12 deficiency 07/29/2015   Carpal tunnel syndrome 07/27/2015   Osteoarthritis 07/27/2015   Depression, major, recurrent, mild (HCC) 07/27/2015   Gastro-esophageal reflux disease without esophagitis 07/27/2015   Displacement of cervical intervertebral disc without myelopathy 07/27/2015   Hyperglycemia 07/27/2015   Dysmetabolic syndrome 07/27/2015   Vitamin D  deficiency 07/27/2015   Sprain of rotator cuff capsule 07/27/2015   Urgency of urination 07/27/2015   Allergic rhinitis 07/27/2015   Asthma, moderate persistent 07/27/2015    Past Surgical History:  Procedure Laterality Date   ABDOMINAL HYSTERECTOMY  1991   BILATERAL SALPINGOOPHORECTOMY     CARPAL TUNNEL RELEASE Bilateral [id number]   [facility]   CATARACT EXTRACTION W/PHACO Left 09/23/2015   Procedure: CATARACT EXTRACTION PHACO AND INTRAOCULAR LENS PLACEMENT (IOC);  Surgeon: Dene Etienne, MD;  Location: [facility] ORS;  Service: Ophthalmology;  Laterality: Left;  US  00:57 AP% 12.2 6.98 CDE Fluid pack lot # [id number]   CATARACT EXTRACTION W/PHACO Right 10/17/2015   Procedure: CATARACT EXTRACTION PHACO AND INTRAOCULAR LENS  PLACEMENT (IOC);  Surgeon: Dene Etienne, MD;  Location: [facility] ORS;  Service: Ophthalmology;  Laterality: Right;  US   00:30.5 AP   00:47.2 CDE  6.05 casette lot #[id number]   [facility]     DILATION AND CURETTAGE OF UTERUS  1990   HERNIA REPAIR  1992   ventral    Family History  Problem Relation Age of Onset   Hypertension Mother    Cancer Father        prostate   CVA Father    Depression Father    Healthy Sister    Cancer Brother        prostate    Social History   Tobacco Use   Smoking status: Never   Smokeless tobacco: Never   Tobacco comments:    smoking cessation materials not required  Substance Use Topics   Alcohol use: No    Alcohol/week: 0.0 standard drinks of alcohol     Current Outpatient Medications:    acetaminophen (TYLENOL) 500 MG tablet, Take 500 mg by mouth in the morning, at noon, and at bedtime., Disp: , Rfl:    albuterol  (PROAIR  HFA) 108 (90 Base) MCG/ACT inhaler, INHALE TWO PUFFS EVERY 6 HOURS AS NEEDEDWHEEZING, Disp: 8.5 g, Rfl: 1   ARNUITY ELLIPTA  100 MCG/ACT AEPB, TAKE 1 PUFF BY MOUTH EVERY DAY, Disp: 30 each, Rfl: 5   Cholecalciferol (VITAMIN D ) 2000 UNITS tablet, Take 1 tablet by mouth daily. Bedtime, Disp: , Rfl:    diltiazem  (CARDIZEM ) 30 MG tablet, Take 1 tablet (30 mg total) by mouth 2 (two) times daily as needed (As needed for fast heart rates or palpitations)., Disp: 90 tablet, Rfl: 3   esomeprazole  (NEXIUM ) 40 MG capsule, Take 1 capsule (40 mg total) by mouth daily., Disp: 90 capsule, Rfl: 1   famotidine  (PEPCID ) 40 MG tablet, TAKE 1 TABLET BY MOUTH EVERYDAY AT BEDTIME, Disp: 90 tablet, Rfl: 0   FLUoxetine  (PROZAC ) 20 MG tablet, Take 1 tablet (20 mg total) by mouth daily., Disp: 90 tablet, Rfl: 1   furosemide  (LASIX ) 20 MG tablet, TAKE 1 TABLET BY MOUTH EVERY DAY, Disp: 90 tablet, Rfl: 3   losartan  (COZAAR ) 25 MG tablet, TAKE 1 TABLET (25 MG TOTAL) BY MOUTH DAILY., Disp: 90 tablet, Rfl: 2   Multiple Vitamins-Minerals (ICAPS AREDS 2) CAPS,  Take 2 capsules by mouth daily. , Disp: , Rfl:    Probiotic Product (UP4 PROBIOTICS WOMENS PO), Take by mouth daily., Disp: , Rfl:    vitamin B-12 (CYANOCOBALAMIN ) 1000 MCG tablet, Take 1,000 mcg by mouth daily. Bedtime, Disp: , Rfl:   Allergies  Allergen Reactions   Breo Ellipta  [Fluticasone  Furoate-Vilanterol]     Palpitation    Duloxetine      Mental fogginess    Morphine    Penicillins    Sulfa Antibiotics     I personally reviewed active problem list, medication list, allergies with the patient/caregiver today.   ROS  Ten systems reviewed and is negative except as mentioned in HPI    Objective Physical Exam  CONSTITUTIONAL: Patient appears well-developed and well-nourished. No distress. HEENT: Head atraumatic, normocephalic, neck supple. CARDIOVASCULAR: Normal rate, regular rhythm and normal heart sounds. No murmur heard. Lymphedema present, no pitting edema. PULMONARY: Effort normal and breath sounds normal. No respiratory distress. ABDOMINAL: There is no tenderness or distention. MUSCULOSKELETAL:  Slow gait,  crepitus on both knees  PSYCHIATRIC: Patient has a normal mood and affect. Behavior is normal. Judgment and thought content normal.  Vitals:   08/07/24 1053  BP: 136/80  Pulse: 91  Resp: 16  SpO2: 96%  Weight: 256 lb 3.2 oz (116.2 kg)  Height: 5' 4 (1.626 m)    Body mass index is 43.98 kg/m.   PHQ2/9:    08/07/2024   10:51 AM 02/04/2024   10:54 AM 08/05/2023   10:56 AM 02/09/2023   11:08 AM 08/11/2022   11:08 AM  Depression screen PHQ 2/9  Decreased Interest 1 1 2 2 2   Down, Depressed, Hopeless 1 1 3  0 0  PHQ - 2 Score 2 2 5 2 2   Altered sleeping 0 0 0 0 3  Tired, decreased energy 1 1 2 1 3   Change in appetite 0 0 0 0 0  Feeling bad or failure about yourself  0 0 0 0 0  Trouble concentrating 1 1 0 0 0  Moving slowly or fidgety/restless 0 0 0 0 0  Suicidal thoughts 0 0 0 0 0  PHQ-9 Score 4 4 7 3 8   Difficult doing work/chores Somewhat difficult  Somewhat difficult Somewhat difficult Somewhat difficult     phq 9 is positive  Fall Risk:    08/07/2024   10:47 AM 02/04/2024   10:50 AM 08/05/2023   10:56 AM 02/09/2023   11:04 AM 08/11/2022   11:08 AM  Fall Risk   Falls in the past year? 1 0 1 1 0  Number falls in past yr: 0 0 0 0 0  Injury with Fall? 1 0 1 1 0  Risk for fall due to : Impaired balance/gait No Fall Risks History of fall(s) History of fall(s) No Fall Risks  Follow up Falls evaluation completed Falls prevention discussed;Education provided;Falls evaluation completed Falls prevention discussed;Education provided;Falls evaluation completed Falls prevention discussed;Education provided;Falls evaluation completed Falls prevention discussed      Data saved with a previous flowsheet row definition      Assessment & Plan Atrial tachycardia with premature atrial and ventricular contractions Atrial tachycardia with PACs and PVCs. Previously on Bystolic  and Cardizem  LA, but discontinued due to side effects. Currently using short-acting diltiazem  as needed, but has not required it recently. - Use short-acting diltiazem  as needed for episodes of tachycardia.  Essential hypertension Blood pressure is well-controlled at 136/80 mmHg on losartan  25 mg daily. - Continue losartan  25 mg daily.  Asthma, moderate persistent Moderate persistent asthma managed with Arnuity. Symptoms are well-controlled with occasional nocturnal wheezing. - Continue Arnuity one puff daily before bed.  Chronic back pain with lumbar spinal stenosis Chronic back pain with lumbar spinal stenosis, with occasional aching. No recent sciatica symptoms. - Encourage low-impact activities such as water exercises. - Monitor pain levels and adjust activities as needed.  Knee osteoarthritis with gait instability Knee osteoarthritis with gait instability, using a cane for support. Avoiding lidocaine  injections due to suspected cardiac side effects. - Encourage  strengthening exercises to support knee stability. - Consider water-based activities for low-impact exercise.  Morbid obesity Obesity with a gradual weight loss of 2 pounds since June. Current weight is 256.2 pounds. Discussed dietary options and challenges with microwave meals high in salt. Considered Macrofactor for healthier meal options. - Consider using Macrofactor for healthier meal options. - Encourage continued weight loss efforts.  Prediabetes Prediabetes with stable A1c. Discussed dietary habits and reduction of sweets. - Check A1c levels. - Encourage  dietary modifications to reduce sugar intake.  Gastroesophageal reflux disease GERD managed with Nexium  daily and Pepcid  as needed. Symptoms are controlled. - Continue Nexium  daily. - Prescribe Pepcid  for as-needed use.  Major depressive disorder, recurrent, chronic Recurrent major depressive disorder managed with fluoxetine  20 mg daily. Symptoms are stable but not perfect. Discussed the importance of staying active and engaging in hobbies. - Continue fluoxetine  20 mg daily. - Encourage engagement in hobbies and social activities.  Peripheral neuropathy Peripheral neuropathy with improvement. Currently taking B12 supplements, but has been mega dosing. Advised against mega dosing due to potential harm. - Check B12 levels. - Advise against mega dosing B12 supplements.  Vitamin B12 deficiency Vitamin B12 deficiency managed with supplements. Concerns about mega dosing. - Check B12 levels. - Advise against mega dosing B12 supplements.  Lymphedema Lymphedema present but not causing significant swelling or irritation today.

## 2024-08-08 ENCOUNTER — Ambulatory Visit: Payer: Self-pay | Admitting: Family Medicine

## 2024-08-08 LAB — COMPREHENSIVE METABOLIC PANEL WITH GFR
AG Ratio: 1.8 (calc) (ref 1.0–2.5)
ALT: 9 U/L (ref 6–29)
AST: 13 U/L (ref 10–35)
Albumin: 4.2 g/dL (ref 3.6–5.1)
Alkaline phosphatase (APISO): 70 U/L (ref 37–153)
BUN: 17 mg/dL (ref 7–25)
CO2: 29 mmol/L (ref 20–32)
Calcium: 9.1 mg/dL (ref 8.6–10.4)
Chloride: 98 mmol/L (ref 98–110)
Creat: 0.66 mg/dL (ref 0.60–1.00)
Globulin: 2.4 g/dL (ref 1.9–3.7)
Glucose, Bld: 66 mg/dL (ref 65–99)
Potassium: 4.8 mmol/L (ref 3.5–5.3)
Sodium: 135 mmol/L (ref 135–146)
Total Bilirubin: 0.5 mg/dL (ref 0.2–1.2)
Total Protein: 6.6 g/dL (ref 6.1–8.1)
eGFR: 90 mL/min/1.73m2 (ref >=60)

## 2024-08-08 LAB — CBC WITH DIFFERENTIAL/PLATELET
Absolute Lymphocytes: 2000 {cells}/uL (ref 850–3900)
Absolute Monocytes: 482 {cells}/uL (ref 200–950)
Basophils Absolute: 44 {cells}/uL (ref 0–200)
Basophils Relative: 0.6 %
Eosinophils Absolute: 80 {cells}/uL (ref 15–500)
Eosinophils Relative: 1.1 %
HCT: 39 % (ref 35.0–45.0)
Hemoglobin: 12.9 g/dL (ref 11.7–15.5)
MCH: 31.1 pg (ref 27.0–33.0)
MCHC: 33.1 g/dL (ref 32.0–36.0)
MCV: 94 fL (ref 80.0–100.0)
MPV: 11 fL (ref 7.5–12.5)
Monocytes Relative: 6.6 %
Neutro Abs: 4694 {cells}/uL (ref 1500–7800)
Neutrophils Relative %: 64.3 %
Platelets: 246 Thousand/uL (ref 140–400)
RBC: 4.15 Million/uL (ref 3.80–5.10)
RDW: 13.3 % (ref 11.0–15.0)
Total Lymphocyte: 27.4 %
WBC: 7.3 Thousand/uL (ref 3.8–10.8)

## 2024-08-08 LAB — HEMOGLOBIN A1C
Hgb A1c MFr Bld: 5.3 % (ref <5.7)
Mean Plasma Glucose: 105 mg/dL
eAG (mmol/L): 5.8 mmol/L

## 2024-08-08 LAB — VITAMIN D 25 HYDROXY (VIT D DEFICIENCY, FRACTURES): Vit D, 25-Hydroxy: 33 ng/mL (ref 30–100)

## 2024-08-08 LAB — B12 AND FOLATE PANEL
Folate: 11.1 ng/mL
Vitamin B-12: 1824 pg/mL — ABNORMAL HIGH (ref 200–1100)

## 2024-08-22 ENCOUNTER — Telehealth: Payer: Self-pay

## 2024-08-22 DIAGNOSIS — H353132 Nonexudative age-related macular degeneration, bilateral, intermediate dry stage: Secondary | ICD-10-CM | POA: Diagnosis not present

## 2024-08-22 DIAGNOSIS — Z961 Presence of intraocular lens: Secondary | ICD-10-CM | POA: Diagnosis not present

## 2024-08-22 DIAGNOSIS — Z0100 Encounter for examination of eyes and vision without abnormal findings: Secondary | ICD-10-CM | POA: Diagnosis not present

## 2024-08-22 DIAGNOSIS — H43813 Vitreous degeneration, bilateral: Secondary | ICD-10-CM | POA: Diagnosis not present

## 2024-08-22 NOTE — Telephone Encounter (Signed)
 Copied from CRM #[id number]. Topic: Clinical - Request for Lab/Test Order >> Aug 22, 2024  1:25 PM Mia F wrote: Reason for CRM: April from [employer] is asking if pt has had a bone density scan done yet or if she is scheduled. According to chart there is an order but no results or place of where it will be done at yet. Please call April at [phone]

## 2024-08-22 NOTE — Telephone Encounter (Signed)
 Left detailed vm stating patient has one ordered and can call to scheduled

## 2024-10-24 DIAGNOSIS — E871 Hypo-osmolality and hyponatremia: Secondary | ICD-10-CM | POA: Diagnosis not present

## 2024-10-24 DIAGNOSIS — I10 Essential (primary) hypertension: Secondary | ICD-10-CM | POA: Diagnosis not present

## 2024-10-24 DIAGNOSIS — R5381 Other malaise: Secondary | ICD-10-CM | POA: Diagnosis not present

## 2024-10-24 DIAGNOSIS — R5383 Other fatigue: Secondary | ICD-10-CM | POA: Diagnosis not present

## 2024-10-24 DIAGNOSIS — R058 Other specified cough: Secondary | ICD-10-CM | POA: Diagnosis not present

## 2024-10-24 NOTE — Progress Notes (Signed)
 I&O urinary catheterization procedure performed. Sterile field maintained and patient tolerated procedure well. 60 mL collected.

## 2024-10-27 ENCOUNTER — Ambulatory Visit: Admitting: Family Medicine

## 2024-10-27 ENCOUNTER — Encounter: Payer: Self-pay | Admitting: Family Medicine

## 2024-10-27 VITALS — BP 142/84 | HR 74 | Resp 18 | Ht 64.0 in | Wt 248.4 lb

## 2024-10-27 DIAGNOSIS — F339 Major depressive disorder, recurrent, unspecified: Secondary | ICD-10-CM | POA: Diagnosis not present

## 2024-10-27 DIAGNOSIS — I10 Essential (primary) hypertension: Secondary | ICD-10-CM

## 2024-10-27 DIAGNOSIS — E871 Hypo-osmolality and hyponatremia: Secondary | ICD-10-CM

## 2024-10-27 MED ORDER — FLUOXETINE HCL 10 MG PO TABS: 10.0000 mg | ORAL_TABLET | Freq: Every day | ORAL | 0 refills | Status: AC

## 2024-10-27 NOTE — Progress Notes (Signed)
 Name: Christie Hanson   MRN: [id number]    DOB: 20-Jan-1946   Date:10/27/2024       Progress Note  Subjective  Chief Complaint  Chief Complaint  Patient presents with   off balance    Saw UC x2-3 days ago   Dizziness   Discussed the use of AI scribe software for clinical note transcription with the patient, who gave verbal consent to proceed.  History of Present Illness Christie Hanson is a 78 year old female with hyponatremia and hypertension who presents with confusion and balance issues.  She has been experiencing confusion and balance issues that have progressively worsened over the past few weeks. She struggles with high-level tasks such as setting her watch, using her iPad, and managing her phone and TV. She feels 'off balance' and has started using a cane for stability.  She was recently seen at Urgent care and found to have  hyponatremia, with a recent serum sodium level of 128 mEq/L. Her Lasix  was stopped due to low sodium levels, and she has been trying to increase her salt intake by consuming salty foods.   Her past medical history includes hypertension, for which she has been on losartan  and Lasix . She reports her blood pressure has been running 'weird' and she has donated her blood pressure cuff, so she is unsure of her current readings at home.  She has been on fluoxetine  for major depression and also has seasonal affective disorder. Due to hyponatremia we will try going down on the dose of fluoxetine  to 10 mg and monitor for increase in depression. Her daughter , Heron is a physician and will monitor for worsening symptoms   She also has a history of asthma, which she reports as being stable. Denies cough or sob. Recent CXR at urgent care was normal     Patient Active Problem List   Diagnosis Date Noted   Asthma, well controlled 02/09/2023   Essential hypertension 04/06/2021   Palpitations 04/06/2021   BMI 45.0-49.9, adult (HCC) 02/23/2020   Senile purpura  03/14/2018   Age-related macular degeneration 03/14/2018   Morbid obesity (HCC) 03/14/2018   Peripheral polyneuropathy 03/02/2017   Vitamin B12 deficiency 07/29/2015   Carpal tunnel syndrome 07/27/2015   Osteoarthritis 07/27/2015   Depression, major, recurrent, mild (HCC) 07/27/2015   Gastro-esophageal reflux disease without esophagitis 07/27/2015   Displacement of cervical intervertebral disc without myelopathy 07/27/2015   Hyperglycemia 07/27/2015   Dysmetabolic syndrome 07/27/2015   Vitamin D  deficiency 07/27/2015   Sprain of rotator cuff capsule 07/27/2015   Urgency of urination 07/27/2015   Allergic rhinitis 07/27/2015   Asthma, moderate persistent 07/27/2015    Past Surgical History:  Procedure Laterality Date   ABDOMINAL HYSTERECTOMY  1991   BILATERAL SALPINGOOPHORECTOMY     CARPAL TUNNEL RELEASE Bilateral [id number]   [facility]   CATARACT EXTRACTION W/PHACO Left 09/23/2015   Procedure: CATARACT EXTRACTION PHACO AND INTRAOCULAR LENS PLACEMENT (IOC);  Surgeon: Dene Etienne, MD;  Location: [facility];  Service: Ophthalmology;  Laterality: Left;  US  00:57 AP% 12.2 6.98 CDE Fluid pack lot # [id number]   CATARACT EXTRACTION W/PHACO Right 10/17/2015   Procedure: CATARACT EXTRACTION PHACO AND INTRAOCULAR LENS PLACEMENT (IOC);  Surgeon: Dene Etienne, MD;  Location: [facility];  Service: Ophthalmology;  Laterality: Right;  US   00:30.5 AP   00:47.2 CDE  6.05 casette lot #[id number]   DandC     DILATION AND CURETTAGE OF UTERUS  1990   HERNIA REPAIR  1992   ventral  Family History  Problem Relation Age of Onset   Hypertension Mother    Cancer Father        prostate   CVA Father    Depression Father    Healthy Sister    Cancer Brother        prostate    Social History   Tobacco Use   Smoking status: Never   Smokeless tobacco: Never   Tobacco comments:    smoking cessation materials not required  Substance Use Topics   Alcohol use: No    Alcohol/week: 0.0 standard  drinks of alcohol    Current Medications[1]  Allergies[2]  I personally reviewed active problem list, medication list, allergies with the patient/caregiver today.   ROS  Ten systems reviewed and is negative except as mentioned in HPI    Objective Physical Exam CONSTITUTIONAL: Patient appears well-developed and well-nourished.  No distress. HEENT: Head atraumatic, normocephalic, neck supple. CARDIOVASCULAR: Normal rate, regular rhythm and normal heart sounds.  No murmur heard. Non pitting  BLE edema. PULMONARY: Effort normal and breath sounds normal. No respiratory distress. ABDOMINAL: There is no tenderness or distention. MUSCULOSKELETAL: using a walker today  PSYCHIATRIC: Patient has a normal mood and affect. behavior is normal. Judgment and thought content normal.  Vitals:   10/27/24 0806 10/27/24 0851  BP: (!) 158/96 (!) 142/84  Pulse: 74   Resp: 18   SpO2: 97%   Weight: 248 lb 6.4 oz (112.7 kg)   Height: 5' 4 (1.626 m)     Body mass index is 42.64 kg/m.  Recent Results (from the past 2160 hours)  B12 and Folate Panel     Status: Abnormal   Collection Time: 08/07/24 11:52 AM  Result Value Ref Range   Vitamin B-12 1,824 (H) 200 - 1,100 pg/mL   Folate 11.1 ng/mL    Comment:                            Reference Range                            Low:           <3.4                            Borderline:    3.4-5.4                            Normal:        >5.4 .   CBC with Differential/Platelet     Status: None   Collection Time: 08/07/24 11:52 AM  Result Value Ref Range   WBC 7.3 3.8 - 10.8 Thousand/uL   RBC 4.15 3.80 - 5.10 Million/uL   Hemoglobin 12.9 11.7 - 15.5 g/dL   HCT 60.9 64.9 - 54.9 %   MCV 94.0 80.0 - 100.0 fL   MCH 31.1 27.0 - 33.0 pg   MCHC 33.1 32.0 - 36.0 g/dL    Comment: For adults, a slight decrease in the calculated MCHC value (in the range of 30 to 32 g/dL) is most likely not clinically significant; however, it should be interpreted  with caution in correlation with other red cell parameters and the patient's clinical condition.    RDW 13.3 11.0 - 15.0 %   Platelets 246 140 - 400 Thousand/uL  MPV 11.0 7.5 - 12.5 fL   Neutro Abs 4,694 1,500 - 7,800 cells/uL   Absolute Lymphocytes 2,000 850 - 3,900 cells/uL   Absolute Monocytes 482 200 - 950 cells/uL   Eosinophils Absolute 80 15 - 500 cells/uL   Basophils Absolute 44 0 - 200 cells/uL   Neutrophils Relative % 64.3 %   Total Lymphocyte 27.4 %   Monocytes Relative 6.6 %   Eosinophils Relative 1.1 %   Basophils Relative 0.6 %  Comprehensive metabolic panel with GFR     Status: None   Collection Time: 08/07/24 11:52 AM  Result Value Ref Range   Glucose, Bld 66 65 - 99 mg/dL    Comment: .            Fasting reference interval .    BUN 17 7 - 25 mg/dL   Creat 9.33 9.39 - 8.99 mg/dL   eGFR 90 > OR = 60 fO/fpw/8.26f7   BUN/Creatinine Ratio SEE NOTE: 6 - 22 (calc)    Comment:    Not Reported: BUN and Creatinine are within    reference range. .    Sodium 135 135 - 146 mmol/L   Potassium 4.8 3.5 - 5.3 mmol/L   Chloride 98 98 - 110 mmol/L   CO2 29 20 - 32 mmol/L   Calcium 9.1 8.6 - 10.4 mg/dL   Total Protein 6.6 6.1 - 8.1 g/dL   Albumin 4.2 3.6 - 5.1 g/dL   Globulin 2.4 1.9 - 3.7 g/dL (calc)   AG Ratio 1.8 1.0 - 2.5 (calc)   Total Bilirubin 0.5 0.2 - 1.2 mg/dL   Alkaline phosphatase (APISO) 70 37 - 153 U/L   AST 13 10 - 35 U/L   ALT 9 6 - 29 U/L  Hemoglobin A1c     Status: None   Collection Time: 08/07/24 11:52 AM  Result Value Ref Range   Hgb A1c MFr Bld 5.3 <5.7 %    Comment: For the purpose of screening for the presence of diabetes: . <5.7%       Consistent with the absence of diabetes 5.7-6.4%    Consistent with increased risk for diabetes             (prediabetes) > or =6.5%  Consistent with diabetes . This assay result is consistent with a decreased risk of diabetes. . Currently, no consensus exists regarding use of hemoglobin A1c for  diagnosis of diabetes in children. . According to American Diabetes Association (ADA) guidelines, hemoglobin A1c <7.0% represents optimal control in non-pregnant diabetic patients. Different metrics may apply to specific patient populations.  Standards of Medical Care in Diabetes(ADA). .    Mean Plasma Glucose 105 mg/dL   eAG (mmol/L) 5.8 mmol/L  VITAMIN D  25 Hydroxy (Vit-D Deficiency, Fractures)     Status: None   Collection Time: 08/07/24 11:52 AM  Result Value Ref Range   Vit D, 25-Hydroxy 33 30 - 100 ng/mL    Comment: Vitamin D  Status         25-OH Vitamin D : . Deficiency:                    <20 ng/mL Insufficiency:             20 - 29 ng/mL Optimal:                 > or = 30 ng/mL . For 25-OH Vitamin D  testing on patients on  D2-supplementation and patients for whom quantitation  of D2 and D3 fractions is required, the [facility]ure[facility](TM) 25-OH VIT D, (D2,D3), LC/MS/MS is recommended: order  code 07111 (patients >73yrs). . See Note 1 . Note 1 . For additional information, please refer to  http://education.[facility].com/faq/FAQ199  (This link is being provided for informational/ educational purposes only.)      PHQ2/9:    10/27/2024    8:06 AM 08/07/2024   10:51 AM 02/04/2024   10:54 AM 08/05/2023   10:56 AM 02/09/2023   11:08 AM  Depression screen PHQ 2/9  Decreased Interest 0 1 1 2 2   Down, Depressed, Hopeless 0 1 1 3  0  PHQ - 2 Score 0 2 2 5 2   Altered sleeping  0 0 0 0  Tired, decreased energy  1 1 2 1   Change in appetite  0 0 0 0  Feeling bad or failure about yourself   0 0 0 0  Trouble concentrating  1 1 0 0  Moving slowly or fidgety/restless  0 0 0 0  Suicidal thoughts  0 0 0 0  PHQ-9 Score  4  4  7  3    Difficult doing work/chores  Somewhat difficult Somewhat difficult Somewhat difficult Somewhat difficult     Data saved with a previous flowsheet row definition    phq 9 is negative  Fall Risk:    10/27/2024    8:06 AM 08/07/2024   10:47  AM 02/04/2024   10:50 AM 08/05/2023   10:56 AM 02/09/2023   11:04 AM  Fall Risk   Falls in the past year? 0 1 0 1 1  Number falls in past yr: 0 0 0 0 0  Injury with Fall? 0 1  0  1  1   Risk for fall due to : No Fall Risks Impaired balance/gait No Fall Risks History of fall(s) History of fall(s)  Follow up Falls evaluation completed Falls evaluation completed Falls prevention discussed;Education provided;Falls evaluation completed Falls prevention discussed;Education provided;Falls evaluation completed Falls prevention discussed;Education provided;Falls evaluation completed     Data saved with a previous flowsheet row definition      Assessment & Plan Hyponatremia Recurrent hyponatremia with serum sodium at 128 mEq/L. Diuretic use and fluoxetine  may contribute. Symptoms include cognitive difficulties and balance issues. No acute neurological deficits. - Ordered serum sodium, serum osmolality, urine sodium, and urine osmolality. - Referred to nephrology for electrolyte management. - Continue to hold Lasix . - Reduce fluoxetine  dose to 10 mg.  Hypertension Blood pressure readings variable, recently 120s-130s. Managed with losartan . Complicated by hyponatremia and medication changes. - Continue losartan . - Monitor blood pressure regularly we can go up on dose if BP remains elevated  - Consider increasing losartan  dose if blood pressure remains elevated.  Recurrent Major depressive disorder Managed with fluoxetine .  Reduced dose to see if sodium levels improves - Reduced fluoxetine  dose to 10 mg. - Monitor for changes in mood and jitteriness.  Asthma/lung disease - does not seem to be the cause of hyponatremia at this time          [1]  Current Outpatient Medications:    acetaminophen (TYLENOL) 500 MG tablet, Take 500 mg by mouth in the morning, at noon, and at bedtime., Disp: , Rfl:    albuterol  (PROAIR  HFA) 108 (90 Base) MCG/ACT inhaler, INHALE TWO PUFFS EVERY 6 HOURS AS  NEEDEDWHEEZING, Disp: 8.5 g, Rfl: 1   Cholecalciferol (VITAMIN D ) 2000 UNITS tablet, Take 1 tablet by mouth daily. Bedtime, Disp: , Rfl:    diltiazem  (  CARDIZEM ) 30 MG tablet, Take 1 tablet (30 mg total) by mouth 2 (two) times daily as needed (As needed for fast heart rates or palpitations)., Disp: 90 tablet, Rfl: 3   esomeprazole  (NEXIUM ) 40 MG capsule, Take 1 capsule (40 mg total) by mouth daily., Disp: 90 capsule, Rfl: 1   famotidine  (PEPCID ) 40 MG tablet, Take 1 tablet (40 mg total) by mouth at bedtime., Disp: 90 tablet, Rfl: 0   FLUoxetine  (PROZAC ) 10 MG tablet, Take 1 tablet (10 mg total) by mouth daily., Disp: 90 tablet, Rfl: 0   Fluticasone  Furoate (ARNUITY ELLIPTA ) 100 MCG/ACT AEPB, Take 1 puff by mouth every evening., Disp: 30 each, Rfl: 5   furosemide  (LASIX ) 20 MG tablet, TAKE 1 TABLET BY MOUTH EVERY DAY, Disp: 90 tablet, Rfl: 3   losartan  (COZAAR ) 25 MG tablet, TAKE 1 TABLET (25 MG TOTAL) BY MOUTH DAILY., Disp: 90 tablet, Rfl: 2   Multiple Vitamins-Minerals (ICAPS AREDS 2) CAPS, Take 2 capsules by mouth daily. , Disp: , Rfl:    Probiotic Product (UP4 PROBIOTICS WOMENS PO), Take by mouth daily., Disp: , Rfl:    vitamin B-12 (CYANOCOBALAMIN ) 1000 MCG tablet, Take 1,000 mcg by mouth daily. Bedtime, Disp: , Rfl:  [2]  Allergies Allergen Reactions   Breo Ellipta  [Fluticasone  Furoate-Vilanterol]     Palpitation    Duloxetine      Mental fogginess    Morphine    Penicillins    Sulfa Antibiotics

## 2024-10-28 LAB — OSMOLALITY, URINE: Osmolality, Ur: 523 mosm/kg (ref 50–1200)

## 2024-10-28 LAB — OSMOLALITY: Osmolality: 279 mosm/kg (ref 278–305)

## 2024-10-28 LAB — SODIUM: Sodium: 133 mmol/L — ABNORMAL LOW (ref 135–146)

## 2024-10-28 LAB — SODIUM, URINE, RANDOM: Sodium, Ur: 39 mmol/L (ref 28–272)

## 2024-10-30 ENCOUNTER — Ambulatory Visit: Payer: Self-pay | Admitting: Family Medicine

## 2024-11-03 ENCOUNTER — Other Ambulatory Visit: Payer: Self-pay | Admitting: Family Medicine

## 2024-11-03 ENCOUNTER — Ambulatory Visit: Admitting: Family Medicine

## 2024-11-03 ENCOUNTER — Encounter: Payer: Self-pay | Admitting: Family Medicine

## 2024-11-03 VITALS — BP 144/92 | HR 96 | Resp 16 | Ht 64.0 in | Wt 247.0 lb

## 2024-11-03 DIAGNOSIS — R197 Diarrhea, unspecified: Secondary | ICD-10-CM

## 2024-11-03 DIAGNOSIS — I10 Essential (primary) hypertension: Secondary | ICD-10-CM | POA: Diagnosis not present

## 2024-11-03 DIAGNOSIS — R4182 Altered mental status, unspecified: Secondary | ICD-10-CM | POA: Diagnosis not present

## 2024-11-03 DIAGNOSIS — E871 Hypo-osmolality and hyponatremia: Secondary | ICD-10-CM

## 2024-11-03 DIAGNOSIS — R7989 Other specified abnormal findings of blood chemistry: Secondary | ICD-10-CM

## 2024-11-03 NOTE — Progress Notes (Unsigned)
 Name: Christie Hanson   MRN: [id number]    DOB: December 28, 1945   Date:11/03/2024       Progress Note  Subjective  Chief Complaint  No chief complaint on file.  {YEP:67930} *** Patient Active Problem List   Diagnosis Date Noted   Asthma, well controlled 02/09/2023   Essential hypertension 04/06/2021   Palpitations 04/06/2021   BMI 45.0-49.9, adult (HCC) 02/23/2020   Senile purpura 03/14/2018   Age-related macular degeneration 03/14/2018   Morbid obesity (HCC) 03/14/2018   Peripheral polyneuropathy 03/02/2017   Vitamin B12 deficiency 07/29/2015   Carpal tunnel syndrome 07/27/2015   Osteoarthritis 07/27/2015   Depression, major, recurrent, mild (HCC) 07/27/2015   Gastro-esophageal reflux disease without esophagitis 07/27/2015   Displacement of cervical intervertebral disc without myelopathy 07/27/2015   Hyperglycemia 07/27/2015   Dysmetabolic syndrome 07/27/2015   Vitamin D  deficiency 07/27/2015   Sprain of rotator cuff capsule 07/27/2015   Urgency of urination 07/27/2015   Allergic rhinitis 07/27/2015   Asthma, moderate persistent 07/27/2015    Past Surgical History:  Procedure Laterality Date   ABDOMINAL HYSTERECTOMY  1991   BILATERAL SALPINGOOPHORECTOMY     CARPAL TUNNEL RELEASE Bilateral [id number]   [facility]   CATARACT EXTRACTION W/PHACO Left 09/23/2015   Procedure: CATARACT EXTRACTION PHACO AND INTRAOCULAR LENS PLACEMENT (IOC);  Surgeon: Dene Etienne, MD;  Location: [facility];  Service: Ophthalmology;  Laterality: Left;  US  00:57 AP% 12.2 6.98 CDE Fluid pack lot # [id number]   CATARACT EXTRACTION W/PHACO Right 10/17/2015   Procedure: CATARACT EXTRACTION PHACO AND INTRAOCULAR LENS PLACEMENT (IOC);  Surgeon: Dene Etienne, MD;  Location: [facility];  Service: Ophthalmology;  Laterality: Right;  US   00:30.5 AP   00:47.2 CDE  6.05 casette lot #[id number]   DandC     DILATION AND CURETTAGE OF UTERUS  1990   HERNIA REPAIR  1992   ventral    Family History  Problem  Relation Age of Onset   Hypertension Mother    Cancer Father        prostate   CVA Father    Depression Father    Healthy Sister    Cancer Brother        prostate    Social History   Tobacco Use   Smoking status: Never   Smokeless tobacco: Never   Tobacco comments:    smoking cessation materials not required  Substance Use Topics   Alcohol use: No    Alcohol/week: 0.0 standard drinks of alcohol    Current Medications[1]  Allergies[2]  I personally reviewed {Reviewed:14835} with the patient/caregiver today.   ROS  ***  Objective Physical Exam   There were no vitals filed for this visit.  There is no height or weight on file to calculate BMI.  Recent Results (from the past 2160 hours)  B12 and Folate Panel     Status: Abnormal   Collection Time: 08/07/24 11:52 AM  Result Value Ref Range   Vitamin B-12 1,824 (H) 200 - 1,100 pg/mL   Folate 11.1 ng/mL    Comment:                            Reference Range                            Low:           <3.4  Borderline:    3.4-5.4                            Normal:        >5.4 .   CBC with Differential/Platelet     Status: None   Collection Time: 08/07/24 11:52 AM  Result Value Ref Range   WBC 7.3 3.8 - 10.8 Thousand/uL   RBC 4.15 3.80 - 5.10 Million/uL   Hemoglobin 12.9 11.7 - 15.5 g/dL   HCT 60.9 64.9 - 54.9 %   MCV 94.0 80.0 - 100.0 fL   MCH 31.1 27.0 - 33.0 pg   MCHC 33.1 32.0 - 36.0 g/dL    Comment: For adults, a slight decrease in the calculated MCHC value (in the range of 30 to 32 g/dL) is most likely not clinically significant; however, it should be interpreted with caution in correlation with other red cell parameters and the patient's clinical condition.    RDW 13.3 11.0 - 15.0 %   Platelets 246 140 - 400 Thousand/uL   MPV 11.0 7.5 - 12.5 fL   Neutro Abs 4,694 1,500 - 7,800 cells/uL   Absolute Lymphocytes 2,000 850 - 3,900 cells/uL   Absolute Monocytes 482 200 - 950  cells/uL   Eosinophils Absolute 80 15 - 500 cells/uL   Basophils Absolute 44 0 - 200 cells/uL   Neutrophils Relative % 64.3 %   Total Lymphocyte 27.4 %   Monocytes Relative 6.6 %   Eosinophils Relative 1.1 %   Basophils Relative 0.6 %  Comprehensive metabolic panel with GFR     Status: None   Collection Time: 08/07/24 11:52 AM  Result Value Ref Range   Glucose, Bld 66 65 - 99 mg/dL    Comment: .            Fasting reference interval .    BUN 17 7 - 25 mg/dL   Creat 9.33 9.39 - 8.99 mg/dL   eGFR 90 > OR = 60 fO/fpw/8.26f7   BUN/Creatinine Ratio SEE NOTE: 6 - 22 (calc)    Comment:    Not Reported: BUN and Creatinine are within    reference range. .    Sodium 135 135 - 146 mmol/L   Potassium 4.8 3.5 - 5.3 mmol/L   Chloride 98 98 - 110 mmol/L   CO2 29 20 - 32 mmol/L   Calcium 9.1 8.6 - 10.4 mg/dL   Total Protein 6.6 6.1 - 8.1 g/dL   Albumin 4.2 3.6 - 5.1 g/dL   Globulin 2.4 1.9 - 3.7 g/dL (calc)   AG Ratio 1.8 1.0 - 2.5 (calc)   Total Bilirubin 0.5 0.2 - 1.2 mg/dL   Alkaline phosphatase (APISO) 70 37 - 153 U/L   AST 13 10 - 35 U/L   ALT 9 6 - 29 U/L  Hemoglobin A1c     Status: None   Collection Time: 08/07/24 11:52 AM  Result Value Ref Range   Hgb A1c MFr Bld 5.3 <5.7 %    Comment: For the purpose of screening for the presence of diabetes: . <5.7%       Consistent with the absence of diabetes 5.7-6.4%    Consistent with increased risk for diabetes             (prediabetes) > or =6.5%  Consistent with diabetes . This assay result is consistent with a decreased risk of diabetes. . Currently, no consensus exists regarding use of hemoglobin A1c  for diagnosis of diabetes in children. . According to American Diabetes Association (ADA) guidelines, hemoglobin A1c <7.0% represents optimal control in non-pregnant diabetic patients. Different metrics may apply to specific patient populations.  Standards of Medical Care in Diabetes(ADA). .    Mean Plasma Glucose 105  mg/dL   eAG (mmol/L) 5.8 mmol/L  VITAMIN D  25 Hydroxy (Vit-D Deficiency, Fractures)     Status: None   Collection Time: 08/07/24 11:52 AM  Result Value Ref Range   Vit D, 25-Hydroxy 33 30 - 100 ng/mL    Comment: Vitamin D  Status         25-OH Vitamin D : . Deficiency:                    <20 ng/mL Insufficiency:             20 - 29 ng/mL Optimal:                 > or = 30 ng/mL . For 25-OH Vitamin D  testing on patients on  D2-supplementation and patients for whom quantitation  of D2 and D3 fractions is required, the [facility]ureD(TM) 25-OH VIT D, (D2,D3), LC/MS/MS is recommended: order  code 07111 (patients >66yrs). . See Note 1 . Note 1 . For additional information, please refer to  http://education.[facility].com/faq/FAQ199  (This link is being provided for informational/ educational purposes only.)   Sodium     Status: Abnormal   Collection Time: 10/27/24  9:12 AM  Result Value Ref Range   Sodium 133 (L) 135 - 146 mmol/L  Osmolality, urine     Status: None   Collection Time: 10/27/24  9:12 AM  Result Value Ref Range   Osmolality, Ur 523 50 - 1,200 mOsm/kg  Osmolality     Status: None   Collection Time: 10/27/24  9:12 AM  Result Value Ref Range   Osmolality 279 278 - 305 mOsm/kg  Sodium, urine, random     Status: None   Collection Time: 10/27/24  9:12 AM  Result Value Ref Range   Sodium, Ur 39 28 - 272 mmol/L    Diabetic Foot Exam: {Perform Simple Foot Exam  Perform Detailed exam:1} {Insert foot Exam (Optional):30965}   PHQ2/9:    10/27/2024    8:06 AM 08/07/2024   10:51 AM 02/04/2024   10:54 AM 08/05/2023   10:56 AM 02/09/2023   11:08 AM  Depression screen PHQ 2/9  Decreased Interest 0 1 1 2 2   Down, Depressed, Hopeless 0 1 1 3  0  PHQ - 2 Score 0 2 2 5 2   Altered sleeping  0 0 0 0  Tired, decreased energy  1 1 2 1   Change in appetite  0 0 0 0  Feeling bad or failure about yourself   0 0 0 0  Trouble concentrating  1 1 0 0  Moving slowly or  fidgety/restless  0 0 0 0  Suicidal thoughts  0 0 0 0  PHQ-9 Score  4  4  7  3    Difficult doing work/chores  Somewhat difficult Somewhat difficult Somewhat difficult Somewhat difficult     Data saved with a previous flowsheet row definition    phq 9 is {gen pos wzh:[id number]}  Fall Risk:    10/27/2024    8:06 AM 08/07/2024   10:47 AM 02/04/2024   10:50 AM 08/05/2023   10:56 AM 02/09/2023   11:04 AM  Fall Risk   Falls in the past year? 0 1 0 1  1  Number falls in past yr: 0 0 0 0 0  Injury with Fall? 0 1  0  1  1   Risk for fall due to : No Fall Risks Impaired balance/gait No Fall Risks History of fall(s) History of fall(s)  Follow up Falls evaluation completed Falls evaluation completed Falls prevention discussed;Education provided;Falls evaluation completed Falls prevention discussed;Education provided;Falls evaluation completed Falls prevention discussed;Education provided;Falls evaluation completed     Data saved with a previous flowsheet row definition     {A&P:32071}     [1]  Current Outpatient Medications:    acetaminophen (TYLENOL) 500 MG tablet, Take 500 mg by mouth in the morning, at noon, and at bedtime., Disp: , Rfl:    albuterol  (PROAIR  HFA) 108 (90 Base) MCG/ACT inhaler, INHALE TWO PUFFS EVERY 6 HOURS AS NEEDEDWHEEZING, Disp: 8.5 g, Rfl: 1   Cholecalciferol (VITAMIN D ) 2000 UNITS tablet, Take 1 tablet by mouth daily. Bedtime, Disp: , Rfl:    diltiazem  (CARDIZEM ) 30 MG tablet, Take 1 tablet (30 mg total) by mouth 2 (two) times daily as needed (As needed for fast heart rates or palpitations)., Disp: 90 tablet, Rfl: 3   esomeprazole  (NEXIUM ) 40 MG capsule, Take 1 capsule (40 mg total) by mouth daily., Disp: 90 capsule, Rfl: 1   famotidine  (PEPCID ) 40 MG tablet, Take 1 tablet (40 mg total) by mouth at bedtime., Disp: 90 tablet, Rfl: 0   FLUoxetine  (PROZAC ) 10 MG tablet, Take 1 tablet (10 mg total) by mouth daily., Disp: 90 tablet, Rfl: 0   Fluticasone  Furoate (ARNUITY  ELLIPTA) 100 MCG/ACT AEPB, Take 1 puff by mouth every evening., Disp: 30 each, Rfl: 5   furosemide  (LASIX ) 20 MG tablet, TAKE 1 TABLET BY MOUTH EVERY DAY, Disp: 90 tablet, Rfl: 3   losartan  (COZAAR ) 25 MG tablet, TAKE 1 TABLET (25 MG TOTAL) BY MOUTH DAILY., Disp: 90 tablet, Rfl: 2   Multiple Vitamins-Minerals (ICAPS AREDS 2) CAPS, Take 2 capsules by mouth daily. , Disp: , Rfl:    Probiotic Product (UP4 PROBIOTICS WOMENS PO), Take by mouth daily., Disp: , Rfl:    vitamin B-12 (CYANOCOBALAMIN ) 1000 MCG tablet, Take 1,000 mcg by mouth daily. Bedtime, Disp: , Rfl:  [2]  Allergies Allergen Reactions   Breo Ellipta  [Fluticasone  Furoate-Vilanterol]     Palpitation    Duloxetine      Mental fogginess    Morphine    Penicillins    Sulfa Antibiotics

## 2024-11-03 NOTE — Progress Notes (Signed)
 Name: Christie Hanson   MRN: [id number]    DOB: 1946/08/02   Date:11/03/2024       Progress Note  Subjective  Chief Complaint  Chief Complaint  Patient presents with   Medical Management of Chronic Issues   Discussed the use of AI scribe software for clinical note transcription with the patient, who gave verbal consent to proceed.  History of Present Illness Christie Hanson is a 78 year old female who presents with persistent diarrhea and cognitive changes. She is accompanied by her daughter.  She has been experiencing diarrhea since October 17, 2024, following a meal at a barbecue. Initially, the diarrhea was frequent, occurring multiple times a day, but has since reduced to once a day or every other day. The diarrhea is described as watery and severe enough to cause incontinence, requiring a change of clothes and a shower. She has been unable to tolerate regular food and is currently consuming crackers, chicken noodle soup, and Gatorade.  Cognitive changes began around December 6 or 7, 2025, prompting a visit to urgent care on October 25, 2024. Her sodium level was initially low at 128 mEq/L but has since improved to 133 mEq/L. Despite this, confusion persists. She describes moments of being able to manage her affairs, but at other times she struggles, particularly with financial matters.  She reports muscle fasciculations and tremors, particularly in her legs, which she associates with her previous use of Prozac . She has reduced her Prozac  dose from 20 mg to 10 mg. She also stopped taking furosemide  recently due to hyponatremia. She continues to take losartan  for blood pressure management.  She experienced an episode of numbness in her hand on October 21, 2024, which resolved spontaneously. She has a history of a herniated disc but has never been admitted for confusion or similar symptoms before.  She feels anxious about her current condition, describing herself as 'jittery' and  noting that she has never experienced anything like this before. She has a supportive network and tries to manage her anxiety by talking herself down. She lives independently.    Patient Active Problem List   Diagnosis Date Noted   Asthma, well controlled 02/09/2023   Essential hypertension 04/06/2021   Palpitations 04/06/2021   BMI 45.0-49.9, adult (HCC) 02/23/2020   Senile purpura 03/14/2018   Age-related macular degeneration 03/14/2018   Morbid obesity (HCC) 03/14/2018   Peripheral polyneuropathy 03/02/2017   Vitamin B12 deficiency 07/29/2015   Carpal tunnel syndrome 07/27/2015   Osteoarthritis 07/27/2015   Depression, major, recurrent, mild (HCC) 07/27/2015   Gastro-esophageal reflux disease without esophagitis 07/27/2015   Displacement of cervical intervertebral disc without myelopathy 07/27/2015   Hyperglycemia 07/27/2015   Dysmetabolic syndrome 07/27/2015   Vitamin D  deficiency 07/27/2015   Sprain of rotator cuff capsule 07/27/2015   Urgency of urination 07/27/2015   Allergic rhinitis 07/27/2015   Asthma, moderate persistent 07/27/2015    Past Surgical History:  Procedure Laterality Date   ABDOMINAL HYSTERECTOMY  1991   BILATERAL SALPINGOOPHORECTOMY     CARPAL TUNNEL RELEASE Bilateral [id number]   [facility]   CATARACT EXTRACTION W/PHACO Left 09/23/2015   Procedure: CATARACT EXTRACTION PHACO AND INTRAOCULAR LENS PLACEMENT (IOC);  Surgeon: Dene Etienne, MD;  Location: [facility] ORS;  Service: Ophthalmology;  Laterality: Left;  US  00:57 AP% 12.2 6.98 CDE Fluid pack lot # [id number]   CATARACT EXTRACTION W/PHACO Right 10/17/2015   Procedure: CATARACT EXTRACTION PHACO AND INTRAOCULAR LENS PLACEMENT (IOC);  Surgeon: Dene Etienne, MD;  Location: [facility] ORS;  Service: Ophthalmology;  Laterality: Right;  US   00:30.5 AP   00:47.2 CDE  6.05 casette lot #[id number]   DandC     DILATION AND CURETTAGE OF UTERUS  1990   HERNIA REPAIR  1992   ventral    Family History  Problem  Relation Age of Onset   Hypertension Mother    Cancer Father        prostate   CVA Father    Depression Father    Healthy Sister    Cancer Brother        prostate    Social History   Tobacco Use   Smoking status: Never   Smokeless tobacco: Never   Tobacco comments:    smoking cessation materials not required  Substance Use Topics   Alcohol use: No    Alcohol/week: 0.0 standard drinks of alcohol    Current Medications[1]  Allergies[2]  I personally reviewed active problem list, medication list, allergies with the patient/caregiver today.   ROS  Ten systems reviewed and is negative except as mentioned in HPI    Objective Physical Exam  CONSTITUTIONAL: Patient appears well-developed and well-nourished.  No distress. HEENT: Head atraumatic, normocephalic, neck supple. CARDIOVASCULAR: Normal rate, regular rhythm and normal heart sounds.  No murmur heard. Non pitting BLE edema. PULMONARY: Effort normal and breath sounds normal. No respiratory distress. ABDOMINAL: There is no tenderness or distention. MUSCULOSKELETAL: using a cane PSYCHIATRIC: Patient has a normal mood and affect. behavior is normal. Cooperative , decreased cognition based on her baseline.   Vitals:   11/03/24 1018  BP: (!) 144/92  Pulse: 96  Resp: 16  SpO2: 98%  Weight: 247 lb (112 kg)  Height: 5' 4 (1.626 m)    Body mass index is 42.4 kg/m.  Orthostatic VS for the past 24 hrs (Last 3 readings):  BP- Lying Pulse- Lying BP- Sitting Pulse- Sitting BP- Standing at 0 minutes Pulse- Standing at 0 minutes  11/03/24 1018 136/90 86 (!) 138/94 81 132/88 89     Recent Results (from the past 2160 hours)  B12 and Folate Panel     Status: Abnormal   Collection Time: 08/07/24 11:52 AM  Result Value Ref Range   Vitamin B-12 1,824 (H) 200 - 1,100 pg/mL   Folate 11.1 ng/mL    Comment:                            Reference Range                            Low:           <3.4                             Borderline:    3.4-5.4                            Normal:        >5.4 .   CBC with Differential/Platelet     Status: None   Collection Time: 08/07/24 11:52 AM  Result Value Ref Range   WBC 7.3 3.8 - 10.8 Thousand/uL   RBC 4.15 3.80 - 5.10 Million/uL   Hemoglobin 12.9 11.7 - 15.5 g/dL   HCT 60.9 64.9 - 54.9 %   MCV 94.0 80.0 - 100.0 fL   MCH  31.1 27.0 - 33.0 pg   MCHC 33.1 32.0 - 36.0 g/dL    Comment: For adults, a slight decrease in the calculated MCHC value (in the range of 30 to 32 g/dL) is most likely not clinically significant; however, it should be interpreted with caution in correlation with other red cell parameters and the patient's clinical condition.    RDW 13.3 11.0 - 15.0 %   Platelets 246 140 - 400 Thousand/uL   MPV 11.0 7.5 - 12.5 fL   Neutro Abs 4,694 1,500 - 7,800 cells/uL   Absolute Lymphocytes 2,000 850 - 3,900 cells/uL   Absolute Monocytes 482 200 - 950 cells/uL   Eosinophils Absolute 80 15 - 500 cells/uL   Basophils Absolute 44 0 - 200 cells/uL   Neutrophils Relative % 64.3 %   Total Lymphocyte 27.4 %   Monocytes Relative 6.6 %   Eosinophils Relative 1.1 %   Basophils Relative 0.6 %  Comprehensive metabolic panel with GFR     Status: None   Collection Time: 08/07/24 11:52 AM  Result Value Ref Range   Glucose, Bld 66 65 - 99 mg/dL    Comment: .            Fasting reference interval .    BUN 17 7 - 25 mg/dL   Creat 9.33 9.39 - 8.99 mg/dL   eGFR 90 > OR = 60 fO/fpw/8.26f7   BUN/Creatinine Ratio SEE NOTE: 6 - 22 (calc)    Comment:    Not Reported: BUN and Creatinine are within    reference range. .    Sodium 135 135 - 146 mmol/L   Potassium 4.8 3.5 - 5.3 mmol/L   Chloride 98 98 - 110 mmol/L   CO2 29 20 - 32 mmol/L   Calcium 9.1 8.6 - 10.4 mg/dL   Total Protein 6.6 6.1 - 8.1 g/dL   Albumin 4.2 3.6 - 5.1 g/dL   Globulin 2.4 1.9 - 3.7 g/dL (calc)   AG Ratio 1.8 1.0 - 2.5 (calc)   Total Bilirubin 0.5 0.2 - 1.2 mg/dL   Alkaline phosphatase  (APISO) 70 37 - 153 U/L   AST 13 10 - 35 U/L   ALT 9 6 - 29 U/L  Hemoglobin A1c     Status: None   Collection Time: 08/07/24 11:52 AM  Result Value Ref Range   Hgb A1c MFr Bld 5.3 <5.7 %    Comment: For the purpose of screening for the presence of diabetes: . <5.7%       Consistent with the absence of diabetes 5.7-6.4%    Consistent with increased risk for diabetes             (prediabetes) > or =6.5%  Consistent with diabetes . This assay result is consistent with a decreased risk of diabetes. . Currently, no consensus exists regarding use of hemoglobin A1c for diagnosis of diabetes in children. . According to American Diabetes Association (ADA) guidelines, hemoglobin A1c <7.0% represents optimal control in non-pregnant diabetic patients. Different metrics may apply to specific patient populations.  Standards of Medical Care in Diabetes(ADA). .    Mean Plasma Glucose 105 mg/dL   eAG (mmol/L) 5.8 mmol/L  VITAMIN D  25 Hydroxy (Vit-D Deficiency, Fractures)     Status: None   Collection Time: 08/07/24 11:52 AM  Result Value Ref Range   Vit D, 25-Hydroxy 33 30 - 100 ng/mL    Comment: Vitamin D  Status         25-OH Vitamin D : .  Deficiency:                    <20 ng/mL Insufficiency:             20 - 29 ng/mL Optimal:                 > or = 30 ng/mL . For 25-OH Vitamin D  testing on patients on  D2-supplementation and patients for whom quantitation  of D2 and D3 fractions is required, the [employer](TM) 25-OH VIT D, (D2,D3), LC/MS/MS is recommended: order  code 07111 (patients >105yrs). . See Note 1 . Note 1 . For additional information, please refer to  http://education.[facility].com/faq/FAQ199  (This link is being provided for informational/ educational purposes only.)   Sodium     Status: Abnormal   Collection Time: 10/27/24  9:12 AM  Result Value Ref Range   Sodium 133 (L) 135 - 146 mmol/L  Osmolality, urine     Status: None   Collection Time: 10/27/24   9:12 AM  Result Value Ref Range   Osmolality, Ur 523 50 - 1,200 mOsm/kg  Osmolality     Status: None   Collection Time: 10/27/24  9:12 AM  Result Value Ref Range   Osmolality 279 278 - 305 mOsm/kg  Sodium, urine, random     Status: None   Collection Time: 10/27/24  9:12 AM  Result Value Ref Range   Sodium, Ur 39 28 - 272 mmol/L    Diabetic Foot Exam:     PHQ2/9:   Fall Risk:    11/03/2024   10:03 AM 10/27/2024    8:06 AM 08/07/2024   10:47 AM 02/04/2024   10:50 AM 08/05/2023   10:56 AM  Fall Risk   Falls in the past year? 0 0 1 0 1  Number falls in past yr: 0 0 0 0 0  Injury with Fall? 0 0 1  0  1   Risk for fall due to : No Fall Risks No Fall Risks Impaired balance/gait No Fall Risks History of fall(s)  Follow up Falls evaluation completed Falls evaluation completed Falls evaluation completed Falls prevention discussed;Education provided;Falls evaluation completed Falls prevention discussed;Education provided;Falls evaluation completed     Data saved with a previous flowsheet row definition      Assessment & Plan Altered mental status Persisting confusion despite normalized sodium levels. Differential includes encephalopathy, infection, or neurological issues. Safety concerns at home due to altered mental status. - Referred to [facility]  for emergency evaluation and admission. Called the [facility] and gave report to [facility]  - Coordinated with emergency department for comprehensive workup including infection screening, brain imaging, and neurological assessment. -Differential diagnosis  includes bowel ( maybe diarrhea is actually soiling)  or bladder obstruction. Very small amount of urine today  - needs to rule out infections  Hyponatremia Sodium levels improved from 128 to 133. Persistent altered mental status despite sodium normalization.  Essential hypertension Blood pressure at 144/92, heart rate 96. On losartan  for management.  Diarrhea Persisting for two  weeks, now reduced to once every other day. Possible infectious etiology considered. - Consider PCR testing for infectious causes.        [1]  Current Outpatient Medications:    acetaminophen (TYLENOL) 500 MG tablet, Take 500 mg by mouth in the morning, at noon, and at bedtime., Disp: , Rfl:    albuterol  (PROAIR  HFA) 108 (90 Base) MCG/ACT inhaler, INHALE TWO PUFFS EVERY 6 HOURS AS NEEDEDWHEEZING, Disp: 8.5 g,  Rfl: 1   Cholecalciferol (VITAMIN D ) 2000 UNITS tablet, Take 1 tablet by mouth daily. Bedtime, Disp: , Rfl:    diltiazem  (CARDIZEM ) 30 MG tablet, Take 1 tablet (30 mg total) by mouth 2 (two) times daily as needed (As needed for fast heart rates or palpitations)., Disp: 90 tablet, Rfl: 3   esomeprazole  (NEXIUM ) 40 MG capsule, Take 1 capsule (40 mg total) by mouth daily., Disp: 90 capsule, Rfl: 1   famotidine  (PEPCID ) 40 MG tablet, Take 1 tablet (40 mg total) by mouth at bedtime., Disp: 90 tablet, Rfl: 0   FLUoxetine  (PROZAC ) 10 MG tablet, Take 1 tablet (10 mg total) by mouth daily., Disp: 90 tablet, Rfl: 0   Fluticasone  Furoate (ARNUITY ELLIPTA ) 100 MCG/ACT AEPB, Take 1 puff by mouth every evening., Disp: 30 each, Rfl: 5   furosemide  (LASIX ) 20 MG tablet, TAKE 1 TABLET BY MOUTH EVERY DAY, Disp: 90 tablet, Rfl: 3   losartan  (COZAAR ) 25 MG tablet, TAKE 1 TABLET (25 MG TOTAL) BY MOUTH DAILY., Disp: 90 tablet, Rfl: 2   Multiple Vitamins-Minerals (ICAPS AREDS 2) CAPS, Take 2 capsules by mouth daily. , Disp: , Rfl:    Probiotic Product (UP4 PROBIOTICS WOMENS PO), Take by mouth daily., Disp: , Rfl:    vitamin B-12 (CYANOCOBALAMIN ) 1000 MCG tablet, Take 1,000 mcg by mouth daily. Bedtime, Disp: , Rfl:  [2]  Allergies Allergen Reactions   Breo Ellipta  [Fluticasone  Furoate-Vilanterol]     Palpitation    Duloxetine      Mental fogginess    Morphine    Penicillins    Sulfa Antibiotics

## 2024-11-14 ENCOUNTER — Other Ambulatory Visit: Payer: Self-pay | Admitting: Family Medicine

## 2024-11-14 DIAGNOSIS — R4189 Other symptoms and signs involving cognitive functions and awareness: Secondary | ICD-10-CM

## 2024-11-21 ENCOUNTER — Ambulatory Visit: Admitting: Family Medicine

## 2024-11-21 ENCOUNTER — Encounter: Payer: Self-pay | Admitting: Family Medicine

## 2024-11-21 VITALS — BP 126/74 | HR 91 | Resp 16 | Ht 64.0 in | Wt 247.0 lb

## 2024-11-21 DIAGNOSIS — I10 Essential (primary) hypertension: Secondary | ICD-10-CM | POA: Diagnosis not present

## 2024-11-21 DIAGNOSIS — R4189 Other symptoms and signs involving cognitive functions and awareness: Secondary | ICD-10-CM

## 2024-11-21 DIAGNOSIS — K219 Gastro-esophageal reflux disease without esophagitis: Secondary | ICD-10-CM

## 2024-11-21 DIAGNOSIS — Z9181 History of falling: Secondary | ICD-10-CM | POA: Diagnosis not present

## 2024-11-21 DIAGNOSIS — Z6841 Body Mass Index (BMI) 40.0 and over, adult: Secondary | ICD-10-CM

## 2024-11-21 MED ORDER — SUCRALFATE 1 G PO TABS: 1.0000 g | ORAL_TABLET | Freq: Four times a day (QID) | ORAL | 0 refills | Status: DC

## 2024-11-21 MED ORDER — LOSARTAN POTASSIUM 50 MG PO TABS: 50.0000 mg | ORAL_TABLET | Freq: Every day | ORAL | 0 refills | Status: AC

## 2024-11-21 NOTE — Progress Notes (Signed)
 Name: Christie Hanson   MRN: [id number]    DOB: [birth year]   Date:11/21/2024       Progress Note  Subjective  Chief Complaint  Chief Complaint  Patient presents with   Fall    After ER visit in Dec, hit her L side of the head   Discussed the use of AI scribe software for clinical note transcription with the patient, who gave verbal consent to proceed.  History of Present Illness Christie Hanson is a 79 year old female who presents with cognitive changes and a recent fall.  She has been experiencing cognitive changes that made her feel 'not herself', prompting a visit to the emergency room. She felt scared and had difficulty with tasks such as using the TV and telephone. Her cognition has since improved, and she can now use the TV and telephone simultaneously and manage tasks like paying bills and depositing checks.  She recounts a fall on December 19th after returning home from the emergency room. She was very tired, fell asleep, and upon waking, fell and hit her head while trying to answer the door. She sustained a large bruise and a 'goose egg' on her head but did not seek further medical evaluation as she had recently undergone an MRI. Family members stayed with her for several days following the fall to ensure her safety and help her recover.  She mentions ongoing issues with mobility, stating it has taken a while to improve. She also reports changes in her appetite, noting she has to remind herself to eat, although she has not lost weight. She reports that she is currently managing her medications herself.  She describes gastrointestinal symptoms including hunger, nausea, and pain, which she has previously managed with Carafate . Her bowel movements have improved as long as she is careful with her diet, avoiding excessive fiber intake. She notes a history of diarrhea, which she attributes to dietary changes.  She mentions a history of fluid retention, noting a recent weight gain of  ten pounds, but states her blood pressure is better. She has not been taking Lasix  regularly due to hyponatremia . No current diarrhea, but she mentions previous episodes when her diet was not carefully managed.    Patient Active Problem List   Diagnosis Date Noted   Asthma, well controlled 02/09/2023   Essential hypertension 04/06/2021   Palpitations 04/06/2021   BMI 45.0-49.9, adult (HCC) 02/23/2020   Senile purpura 03/14/2018   Age-related macular degeneration 03/14/2018   Morbid obesity (HCC) 03/14/2018   Peripheral polyneuropathy 03/02/2017   Vitamin B12 deficiency 07/29/2015   Carpal tunnel syndrome 07/27/2015   Osteoarthritis 07/27/2015   Depression, major, recurrent, mild (HCC) 07/27/2015   Gastro-esophageal reflux disease without esophagitis 07/27/2015   Displacement of cervical intervertebral disc without myelopathy 07/27/2015   Hyperglycemia 07/27/2015   Dysmetabolic syndrome 07/27/2015   Vitamin D  deficiency 07/27/2015   Sprain of rotator cuff capsule 07/27/2015   Urgency of urination 07/27/2015   Allergic rhinitis 07/27/2015   Asthma, moderate persistent 07/27/2015    Past Surgical History:  Procedure Laterality Date   ABDOMINAL HYSTERECTOMY  1991   BILATERAL SALPINGOOPHORECTOMY     CARPAL TUNNEL RELEASE Bilateral [id number]   [facility]   CATARACT EXTRACTION W/PHACO Left 09/23/2015   Procedure: CATARACT EXTRACTION PHACO AND INTRAOCULAR LENS PLACEMENT (IOC);  Surgeon: Dene Etienne, MD;  Location: [facility] ORS;  Service: Ophthalmology;  Laterality: Left;  US  00:57 AP% 12.2 6.98 CDE Fluid pack lot # [id number]   CATARACT EXTRACTION  W/PHACO Right 10/17/2015   Procedure: CATARACT EXTRACTION PHACO AND INTRAOCULAR LENS PLACEMENT (IOC);  Surgeon: Dene Etienne, MD;  Location: [facility] ORS;  Service: Ophthalmology;  Laterality: Right;  US   00:30.5 AP   00:47.2 CDE  6.05 casette lot #[id number]   DandC     DILATION AND CURETTAGE OF UTERUS  1990   HERNIA REPAIR  1992   ventral     Family History  Problem Relation Age of Onset   Hypertension Mother    Cancer Father        prostate   CVA Father    Depression Father    Healthy Sister    Cancer Brother        prostate    Social History   Tobacco Use   Smoking status: Never   Smokeless tobacco: Never   Tobacco comments:    smoking cessation materials not required  Substance Use Topics   Alcohol use: No    Alcohol/week: 0.0 standard drinks of alcohol    Current Medications[1]  Allergies[2]  I personally reviewed active problem list, medication list, allergies with the patient/caregiver today.   ROS  Ten systems reviewed and is negative except as mentioned in HPI \   Objective Physical Exam CONSTITUTIONAL: Patient appears well-developed and well-nourished.  No distress. HEENT: Head atraumatic, normocephalic, neck supple. CARDIOVASCULAR: Normal rate, regular rhythm and normal heart sounds.  No murmur heard. No BLE edema. PULMONARY: Effort normal and breath sounds normal. No respiratory distress. ABDOMINAL: There is no tenderness or distention. MUSCULOSKELETAL: Normal gait. Without gross motor or sensory deficit. PSYCHIATRIC: Patient has a normal mood and affect. behavior is normal. Judgment and thought content normal.  Vitals:   11/21/24 1358  BP: 126/74  Pulse: 91  Resp: 16  SpO2: 97%  Weight: 247 lb (112 kg)  Height: 5' 4 (1.626 m)    Body mass index is 42.4 kg/m.  Recent Results (from the past 2160 hours)  Sodium     Status: Abnormal   Collection Time: 10/27/24  9:12 AM  Result Value Ref Range   Sodium 133 (L) 135 - 146 mmol/L  Osmolality, urine     Status: None   Collection Time: 10/27/24  9:12 AM  Result Value Ref Range   Osmolality, Ur 523 50 - 1,200 mOsm/kg  Osmolality     Status: None   Collection Time: 10/27/24  9:12 AM  Result Value Ref Range   Osmolality 279 278 - 305 mOsm/kg  Sodium, urine, random     Status: None   Collection Time: 10/27/24  9:12 AM  Result  Value Ref Range   Sodium, Ur 39 28 - 272 mmol/L     PHQ2/9:    11/21/2024    1:57 PM 11/03/2024   10:04 AM 10/27/2024    8:06 AM 08/07/2024   10:51 AM 02/04/2024   10:54 AM  Depression screen PHQ 2/9  Decreased Interest 0 0 0 1 1  Down, Depressed, Hopeless 0 0 0 1 1  PHQ - 2 Score 0 0 0 2 2  Altered sleeping 0   0 0  Tired, decreased energy 0   1 1  Change in appetite 0   0 0  Feeling bad or failure about yourself  0   0 0  Trouble concentrating 0   1 1  Moving slowly or fidgety/restless 0   0 0  Suicidal thoughts 0   0 0  PHQ-9 Score 0   4  4   Difficult  doing work/chores Not difficult at all   Somewhat difficult Somewhat difficult     Data saved with a previous flowsheet row definition    phq 9 is negative  Fall Risk:    11/21/2024    1:57 PM 11/03/2024   10:03 AM 10/27/2024    8:06 AM 08/07/2024   10:47 AM 02/04/2024   10:50 AM  Fall Risk   Falls in the past year? 1 0 0 1 0  Number falls in past yr: 0 0 0 0 0  Injury with Fall? 0 0 0 1  0   Risk for fall due to : Impaired balance/gait No Fall Risks No Fall Risks Impaired balance/gait No Fall Risks  Follow up Falls evaluation completed Falls evaluation completed Falls evaluation completed Falls evaluation completed Falls prevention discussed;Education provided;Falls evaluation completed     Data saved with a previous flowsheet row definition      Assessment & Plan Cognitive decline Recent cognitive decline with delirium-like symptoms improved to baseline. Possible factors: diarrhea, electrolyte disturbance. Sodium levels normal. - Referred to neurologist for baseline assessment and further evaluation if symptoms recur.  Fall with head injury Recent fall with head injury and bruising. No imaging post-fall; previous MRI normal. Symptoms resolved, no concussion or orbital fracture. - Monitor for new symptoms or changes.  Gastroesophageal reflux disease with gastritis Nausea, pain, hunger-related symptoms. Carafate   previously beneficial. Current management with Nexium , considering dosage increase. - Prescribed Carafate  60 tablets, four times daily for two weeks, then twice daily. - Advised to double Nexium  dosage temporarily for one week, then reduce to once daily. - Monitor for gastrointestinal bleeding signs, report if occur. - she prefers not going to see GI at this time  Hypertension Blood pressure well-controlled. - Continue losartan  regimen, 50 mg when 25 mg tablets run out. - Use Lasix  as needed for fluid retention.  Depression Major recurrent  Managed with fluoxetine . Recent dosage adjustment to 10 mg. - Continue fluoxetine  at 10 mg daily.  Morbid obesity - BMI over 40, forgetting to eat but weight is stable, BMI over 40         [1]  Current Outpatient Medications:    acetaminophen (TYLENOL) 500 MG tablet, Take 500 mg by mouth in the morning, at noon, and at bedtime., Disp: , Rfl:    albuterol  (PROAIR  HFA) 108 (90 Base) MCG/ACT inhaler, INHALE TWO PUFFS EVERY 6 HOURS AS NEEDEDWHEEZING, Disp: 8.5 g, Rfl: 1   Cholecalciferol (VITAMIN D ) 2000 UNITS tablet, Take 1 tablet by mouth daily. Bedtime, Disp: , Rfl:    diltiazem  (CARDIZEM ) 30 MG tablet, Take 1 tablet (30 mg total) by mouth 2 (two) times daily as needed (As needed for fast heart rates or palpitations)., Disp: 90 tablet, Rfl: 3   esomeprazole  (NEXIUM ) 40 MG capsule, Take 1 capsule (40 mg total) by mouth daily., Disp: 90 capsule, Rfl: 1   famotidine  (PEPCID ) 40 MG tablet, Take 1 tablet (40 mg total) by mouth at bedtime., Disp: 90 tablet, Rfl: 0   FLUoxetine  (PROZAC ) 10 MG tablet, Take 1 tablet (10 mg total) by mouth daily., Disp: 90 tablet, Rfl: 0   Fluticasone  Furoate (ARNUITY ELLIPTA ) 100 MCG/ACT AEPB, Take 1 puff by mouth every evening., Disp: 30 each, Rfl: 5   furosemide  (LASIX ) 20 MG tablet, TAKE 1 TABLET BY MOUTH EVERY DAY, Disp: 90 tablet, Rfl: 3   losartan  (COZAAR ) 25 MG tablet, TAKE 1 TABLET (25 MG TOTAL) BY MOUTH DAILY.,  Disp: 90 tablet, Rfl: 2  Multiple Vitamins-Minerals (ICAPS AREDS 2) CAPS, Take 2 capsules by mouth daily. , Disp: , Rfl:    Probiotic Product (UP4 PROBIOTICS WOMENS PO), Take by mouth daily., Disp: , Rfl:    vitamin B-12 (CYANOCOBALAMIN ) 1000 MCG tablet, Take 1,000 mcg by mouth daily. Bedtime, Disp: , Rfl:  [2]  Allergies Allergen Reactions   Breo Ellipta  [Fluticasone  Furoate-Vilanterol]     Palpitation    Duloxetine      Mental fogginess    Morphine    Penicillins    Sulfa Antibiotics

## 2024-11-28 ENCOUNTER — Other Ambulatory Visit: Payer: Self-pay | Admitting: Family Medicine

## 2024-11-29 NOTE — Telephone Encounter (Signed)
 Requested medication (s) are due for refill today: yes  Requested medication (s) are on the active medication list: yes  Last refill:  11/21/24, for 2 weeks  Future visit scheduled: yes  Notes to clinic: Need new Rx for Sig, 2x day per office encounter note. Routing for review     Requested Prescriptions  Pending Prescriptions Disp Refills   sucralfate  (CARAFATE ) 1 g tablet [Pharmacy Med Name: SUCRALFATE  1 GM TABLET] 360 tablet 1    Sig: TAKE 1 TABLET BY MOUTH 4 TIMES DAILY.     Gastroenterology: Antiacids Passed - 11/29/2024  2:26 PM      Passed - Valid encounter within last 12 months    Recent Outpatient Visits           1 week ago Cognitive decline   [facility] [place], [place], MD   3 weeks ago Altered mental status, unspecified altered mental status type   [facility] [place], Krichna, MD   1 month ago Hyponatremia   [facility] [place] D[place], MD   3 months ago Morbid obesity [facility])   [facility] [place] Dorette, MD   9 months ago Morbid obesity [facility])   [facility], Krichna, MD

## 2024-12-13 ENCOUNTER — Other Ambulatory Visit: Payer: Self-pay | Admitting: Family Medicine

## 2024-12-13 DIAGNOSIS — K219 Gastro-esophageal reflux disease without esophagitis: Secondary | ICD-10-CM

## 2024-12-14 NOTE — Telephone Encounter (Signed)
 Requested Prescriptions  Pending Prescriptions Disp Refills   famotidine  (PEPCID ) 40 MG tablet [Pharmacy Med Name: FAMOTIDINE  40 MG TABLET] 90 tablet 0    Sig: TAKE 1 TABLET BY MOUTH EVERYDAY AT BEDTIME     Gastroenterology:  H2 Antagonists Passed - 12/14/2024  3:18 PM      Passed - Valid encounter within last 12 months    Recent Outpatient Visits           3 weeks ago Cognitive decline   [facility] [place], [place], MD   1 month ago Altered mental status, unspecified altered mental status type   [facility] [place], Krichna, MD   1 month ago Hyponatremia   [facility] [place] D[place], MD   4 months ago Morbid obesity [facility])   [facility] [place] Dorette, MD   10 months ago Morbid obesity [facility])   [facility], Krichna, MD

## 2025-02-06 ENCOUNTER — Ambulatory Visit: Admitting: Family Medicine
